# Patient Record
Sex: Female | Born: 1975
Health system: Southern US, Community
[De-identification: ages and names within clinical notes are randomized; demographics above are authoritative.]

## PROBLEM LIST (undated history)

## (undated) DIAGNOSIS — I8290 Acute embolism and thrombosis of unspecified vein: Secondary | ICD-10-CM

## (undated) DIAGNOSIS — R7611 Nonspecific reaction to tuberculin skin test without active tuberculosis: Secondary | ICD-10-CM

## (undated) DIAGNOSIS — Z86718 Personal history of other venous thrombosis and embolism: Secondary | ICD-10-CM

## (undated) DIAGNOSIS — M1990 Unspecified osteoarthritis, unspecified site: Secondary | ICD-10-CM

## (undated) DIAGNOSIS — E7800 Pure hypercholesterolemia, unspecified: Secondary | ICD-10-CM

## (undated) DIAGNOSIS — A159 Respiratory tuberculosis unspecified: Secondary | ICD-10-CM

## (undated) DIAGNOSIS — I87009 Postthrombotic syndrome without complications of unspecified extremity: Secondary | ICD-10-CM

## (undated) DIAGNOSIS — O24419 Gestational diabetes mellitus in pregnancy, unspecified control: Secondary | ICD-10-CM

## (undated) DIAGNOSIS — U071 COVID-19: Secondary | ICD-10-CM

## (undated) DIAGNOSIS — I82409 Acute embolism and thrombosis of unspecified deep veins of unspecified lower extremity: Secondary | ICD-10-CM

## (undated) DIAGNOSIS — B019 Varicella without complication: Secondary | ICD-10-CM

## (undated) DIAGNOSIS — D6851 Activated protein C resistance: Secondary | ICD-10-CM

## (undated) DIAGNOSIS — E785 Hyperlipidemia, unspecified: Secondary | ICD-10-CM

## (undated) DIAGNOSIS — E669 Obesity, unspecified: Secondary | ICD-10-CM

## (undated) DIAGNOSIS — E559 Vitamin D deficiency, unspecified: Secondary | ICD-10-CM

## (undated) DIAGNOSIS — I87002 Postthrombotic syndrome without complications of left lower extremity: Secondary | ICD-10-CM

## (undated) HISTORY — DX: Pure hypercholesterolemia, unspecified: E78.00

## (undated) HISTORY — DX: Acute embolism and thrombosis of unspecified deep veins of unspecified lower extremity: I82.409

## (undated) HISTORY — DX: Gestational diabetes mellitus in pregnancy, unspecified control: O24.419

## (undated) HISTORY — DX: Personal history of other venous thrombosis and embolism: Z86.718

## (undated) HISTORY — DX: Nonspecific reaction to tuberculin skin test without active tuberculosis: R76.11

## (undated) HISTORY — DX: Postthrombotic syndrome without complications of unspecified extremity: I87.009

## (undated) HISTORY — DX: Unspecified osteoarthritis, unspecified site: M19.90

## (undated) HISTORY — DX: Respiratory tuberculosis unspecified: A15.9

## (undated) HISTORY — DX: Acute embolism and thrombosis of unspecified vein: I82.90

## (undated) HISTORY — DX: Hyperlipidemia, unspecified: E78.5

## (undated) HISTORY — DX: Postthrombotic syndrome without complications of left lower extremity: I87.002

## (undated) HISTORY — DX: Vitamin D deficiency, unspecified: E55.9

## (undated) HISTORY — DX: Varicella without complication: B01.9

## (undated) HISTORY — DX: Obesity, unspecified: E66.9

## (undated) HISTORY — DX: Activated protein C resistance: D68.51

---

## 2011-12-03 LAB — HM PAP SMEAR: HM Pap smear: NORMAL

## 2012-04-02 DIAGNOSIS — Z86718 Personal history of other venous thrombosis and embolism: Secondary | ICD-10-CM

## 2012-04-02 DIAGNOSIS — I87009 Postthrombotic syndrome without complications of unspecified extremity: Secondary | ICD-10-CM

## 2012-04-02 HISTORY — DX: Personal history of other venous thrombosis and embolism: Z86.718

## 2012-04-02 HISTORY — DX: Postthrombotic syndrome without complications of unspecified extremity: I87.009

## 2012-07-11 ENCOUNTER — Telehealth: Payer: Self-pay | Admitting: Internal Medicine

## 2012-07-11 NOTE — Telephone Encounter (Signed)
This pt is a young lady who has a new patient appt with Dr. Fabian Sharp on 09/24/12. She has researched a vast number of PCP's, and after a long discussion with her regarding Dr. Rosezella [place] qualifications, she has chosen her to be her PCP. Pt states that she obtained a DVT during her pregnancy, and has had seriously complications since then. She's developed multiple problems that led her to a vascular surgeon, as well as a hematologist. She gives herself injections on a daily basis. She states that she is going to need referrals to both of these specialist in the [place] area, as she has just moved here from [place]. Please review these details and notify me if I need to bring her in sooner. Thank you!

## 2012-07-14 NOTE — Telephone Encounter (Signed)
Dr Cyndie Chime  , Autumn Cruz or Autumn Cruz for hematology   [facility] for vascular  Surgery if neededbut not certain what kind of vascular needed  She should get summary information to them from her previous care team.   Be best to make her own appt  with sending  Appropriate information to them first.  ( Since we dont have any information to send them )

## 2012-09-24 ENCOUNTER — Encounter: Payer: Self-pay | Admitting: Internal Medicine

## 2012-09-24 ENCOUNTER — Ambulatory Visit (INDEPENDENT_AMBULATORY_CARE_PROVIDER_SITE_OTHER): Payer: 59 | Admitting: Internal Medicine

## 2012-09-24 VITALS — BP 102/66 | HR 88 | Temp 98.5°F | Ht 64.0 in | Wt 199.0 lb

## 2012-09-24 DIAGNOSIS — Z8249 Family history of ischemic heart disease and other diseases of the circulatory system: Secondary | ICD-10-CM

## 2012-09-24 DIAGNOSIS — D6851 Activated protein C resistance: Secondary | ICD-10-CM

## 2012-09-24 DIAGNOSIS — Z86718 Personal history of other venous thrombosis and embolism: Secondary | ICD-10-CM

## 2012-09-24 DIAGNOSIS — F4320 Adjustment disorder, unspecified: Secondary | ICD-10-CM

## 2012-09-24 DIAGNOSIS — Z7901 Long term (current) use of anticoagulants: Secondary | ICD-10-CM

## 2012-09-24 DIAGNOSIS — M7918 Myalgia, other site: Secondary | ICD-10-CM | POA: Insufficient documentation

## 2012-09-24 DIAGNOSIS — D6859 Other primary thrombophilia: Secondary | ICD-10-CM

## 2012-09-24 DIAGNOSIS — E785 Hyperlipidemia, unspecified: Secondary | ICD-10-CM

## 2012-09-24 DIAGNOSIS — I87009 Postthrombotic syndrome without complications of unspecified extremity: Secondary | ICD-10-CM

## 2012-09-24 DIAGNOSIS — IMO0001 Reserved for inherently not codable concepts without codable children: Secondary | ICD-10-CM

## 2012-09-24 DIAGNOSIS — R7982 Elevated C-reactive protein (CRP): Secondary | ICD-10-CM

## 2012-09-24 NOTE — Patient Instructions (Signed)
Get [place] copy of most recent lipid panel.    Will arrange referrals to hematology and vascular as we discussed . The left buttock pain seems to be Musculoskeletal   And at this time would optimize  Walking with good shoes and consider  PT othre evaluation after above consults to decide next step.   Fu after consults   Or other concerns .

## 2012-09-24 NOTE — Progress Notes (Signed)
Chief Complaint  Patient presents with  . Establish Care    Needs referrals to hematology and cardiovascular surgeon.    Patient comes in as new patient visit . Previous care was  In [place] from where family moved in November for husbands job at [employer] . She is here with her husband and 2 young children for the visit.  She has a history of a left lower extremity deep vein thrombosis that occurred in pregnancy treated with anticoagulants and a filter that was removed later. She was found to have been heterozygous for factor V Leiden and was also noted to have PA 1 homozygote f mutation  homozygote . She has had some persistent dependent swelling in leg discomfort at times in that left lower strandy but has been evaluated by vascular a while back and felt she had postphlebitic syndrome and was told to use compression stockings used 2030 on her regular basis and 3040 on travel days. She is continued on anticoagulation Lovenox 40 mg as a prophylactic dose. At some point she was on Coumadin the discussion of using protects the or other options. At this time the consideration is which anticoagulants and if long life anticoagulation is appropriate.  She has a persistent elevated CRP at one point had her d-dimer monitored to decide about anticoagulation.  She has had evaluation for sleep apnea which was negative had gestational diabetes with only one pregnancy and the question of memory difficulties at some point that had a negative workup. Felt it might be related to stress medical problems sleep deprivation.  She was also evaluated for possible lumbar radiculopathy but had negative NCS lower extremities. 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 She just finished nursing her youngest child brings up the discussion again. Not really looking forward to be on medication if not needed.  She was noted  to have elevated lipid panel and elevated LPa  and a family history of premature cardiac disease in her mother having an MI in her 30s and a CABG. She did have diabetes. Negative family history of clotting except for her father who had an clot with  inactivity. Has multiple siblings with one sibling with medical problems of polio when she was 4 mobility problems and renal failure otherwise.   Had evaluation of carotid bruit that showed normal carotid Dopplers hasn't had coronary artery calcium She and her husband are originally from [place] in [place] for the last 12-13 years. They have 2 young children. 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   Not on lipid meds  Recently stopped nursing. Generic   Lovenox.  Will refer    To  Dr Marlena Clipper.   ROS: See pertinent positives and negatives per HPI. Negative current chest pain shortness of breath major changes in vision unusual bleeding she does have a catch in her left buttocks going down to her leg making it hard to walk which he first gets up in the morning but then works it out and does better. Still gets aching in her left ankle after walking a good bit. Does wear her compression stockings more regularly recently.  She's tried to lose weight but hasn't been successful most recently.  Feels somewhat angry and sad at times having to move young children at home and her mother died within the last year or so.  She's  not desponded but asks if medicine  other intervention as appropriate.  Past Medical History  Diagnosis Date  . Gestational diabetes   . High cholesterol   . Positive TB test   . Thrombosis     Left leg preg tulip filter then removed.     Family History  Problem Relation Age of Onset  . Heart disease Mother     Blockage/Main artery  . Diabetes Mother   . Hypertension Father     History   Social History  . Marital Status: Married    Spouse Name: Saleeth  LuLu    Number of Children: 2  . Years of Education: N/A   Occupational History  . Homemaker    Social History Main Topics  . Smoking status: Never Smoker   . Smokeless tobacco: None  . Alcohol Use: No  . Drug Use: No  . Sexually Active: None   Other Topics Concern  . None   Social History Narrative   Usually gets 6-7 hours of sleep per night   4 people living in the home.   Originally from [place] has a bachelor's degree some training in nursing and alternative medicine.   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   Negative tobacco alcohol some caffeine   Gravida 2 para 2 last Pap 2013   Think she had T. 2012    Outpatient Encounter Prescriptions as of 09/24/2012  Medication Sig Dispense Refill  . enoxaparin (LOVENOX) 40 MG/0.4ML injection Inject 40 mg into the skin daily.      . Multiple Vitamin (MULTI-VITAMIN PO) Take by mouth.       No facility-administered encounter medications on file as of 09/24/2012.    EXAM:  BP 102/66  Pulse 88  Temp(Src) 98.5 F (36.9 C) (Oral)  Ht 5\' 4"  (1.626 m)  Wt 199 lb (90.266 kg)  BMI 34.14 kg/m2  SpO2 98%  LMP 09/14/2012  Body mass index is 34.14 kg/(m^2).  GENERAL: vitals reviewed and listed above, alert, oriented, appears well hydrated and in no acute distress HEENT: atraumatic, conjunctiva  clear, no obvious abnormalities on inspection of external nose and ears  NECK: no obvious masses on inspection palpation i dont hear  bruits  Today are noted LUNGS: clear to auscultation bilaterally, no wheezes, rales or rhonchi, good air movement CV: HRRR, no clubbing cyanosis  nl cap refill left lower extremity slightly more swollen than the right but no redness focal pain Homans sign. Pulses are intact. Abdomen soft without organomegaly guarding or rebound Skin normal color no active bruising or petechiae. MS: moves all  extremities without noticeable focal  Abnormality No focal back pain no obvious weakness negative SLR PSYCH: pleasant and cooperative, no obvious depression or anxiety Records reviewed 3 inches the paper.records from [facility] physicians and specialist   ASSESSMENT AND PLAN:  Discussed the following assessment and plan:  Factor V Leiden mutation - Plan: Ambulatory referral to Hematology  Hypercoagulable state - post preg facto v leid PAI-1 4G/4G homozygote - Plan: Ambulatory referral to Hematology, Ambulatory referral to Vascular Surgery  Hx of thrombosis of lower extremity in pregnancy - Some postphlebitic changes - Plan: Ambulatory referral to Hematology, Ambulatory referral to Vascular Surgery  Hyperlipidemia - Get copy of latest lipid panel, family history of premature heart disease but uncertain if she meets criteria for intervention with medication. Consider CAC if  Left buttock pain - h xof radicular pain neg x ray in past not alarming consider other rx after above   Family history of premature CAD - Mother in her late 57s had diabetes  Adjustment reaction - As best adjustment to move death of mother 2 young children at hem etc. she appears to be pretty balanced consider seeing counselor first discussion of this bef  Elevated C-reactive protein (CRP) - nl esr disc nonspecific  for disease managment    Post-phlebitic syndrome - left. - Plan: Ambulatory referral to Vascular Surgery Options considered consider counseling CBT adjustment. Consider medication if needed. Can assess further at her followup preventive visit. Some names of  OB/GYN given  We'll plan referral to hematology to help with decisions about length of anticoagulation if appropriate and if so which anticoagulants are possible. -Patient advised to return or notify health care team  if symptoms worsen or persist or new concerns arise.  Patient Instructions  Get [place] copy of most recent lipid panel.    Will arrange  referrals to hematology and vascular as we discussed . The left buttock pain seems to be Musculoskeletal   And at this time would optimize  Walking with good shoes and consider  PT othre evaluation after above consults to decide next step.   Fu after consults   Or other concerns .     Neta Mends. Panosh M.D.    Addendum  6 30  Lipid results tc 238 hdl 48 ldl 167 tg 116 ratio 5.0 bg 90

## 2012-09-25 ENCOUNTER — Telehealth: Payer: Self-pay | Admitting: Oncology

## 2012-09-25 NOTE — Telephone Encounter (Signed)
S/w pt in re NP appt 08/06 @ 9:30 w/Dr. Cyndie Chime Labs 07/30 @ 10:30 Referring Dr. Berniece Andreas Dx- Hypercoag; Factor 5; Thombosis of lower extremity Welcome packet mailed.

## 2012-09-29 DIAGNOSIS — Z7901 Long term (current) use of anticoagulants: Secondary | ICD-10-CM | POA: Insufficient documentation

## 2012-10-06 ENCOUNTER — Encounter: Payer: Self-pay | Admitting: Vascular Surgery

## 2012-10-06 ENCOUNTER — Other Ambulatory Visit: Payer: Self-pay

## 2012-10-06 DIAGNOSIS — I872 Venous insufficiency (chronic) (peripheral): Secondary | ICD-10-CM

## 2012-10-06 DIAGNOSIS — M79609 Pain in unspecified limb: Secondary | ICD-10-CM

## 2012-10-22 ENCOUNTER — Telehealth: Payer: Self-pay | Admitting: Oncology

## 2012-10-22 NOTE — Telephone Encounter (Signed)
C/D 10/22/12 for appt. 10/23/12 °

## 2012-10-22 NOTE — Telephone Encounter (Signed)
C/D 10/22/12 for appt.11/05/12

## 2012-10-29 ENCOUNTER — Other Ambulatory Visit: Payer: 59 | Admitting: Lab

## 2012-10-30 ENCOUNTER — Other Ambulatory Visit: Payer: Self-pay | Admitting: Oncology

## 2012-10-30 DIAGNOSIS — D6859 Other primary thrombophilia: Secondary | ICD-10-CM

## 2012-10-30 DIAGNOSIS — Z7901 Long term (current) use of anticoagulants: Secondary | ICD-10-CM

## 2012-10-30 DIAGNOSIS — Z86718 Personal history of other venous thrombosis and embolism: Secondary | ICD-10-CM

## 2012-10-30 DIAGNOSIS — I87009 Postthrombotic syndrome without complications of unspecified extremity: Secondary | ICD-10-CM

## 2012-10-30 DIAGNOSIS — D6851 Activated protein C resistance: Secondary | ICD-10-CM

## 2012-11-05 ENCOUNTER — Encounter: Payer: Self-pay | Admitting: Oncology

## 2012-11-05 ENCOUNTER — Ambulatory Visit (HOSPITAL_BASED_OUTPATIENT_CLINIC_OR_DEPARTMENT_OTHER): Payer: 59 | Admitting: Oncology

## 2012-11-05 ENCOUNTER — Ambulatory Visit (HOSPITAL_BASED_OUTPATIENT_CLINIC_OR_DEPARTMENT_OTHER): Payer: 59 | Admitting: Lab

## 2012-11-05 ENCOUNTER — Telehealth: Payer: Self-pay | Admitting: Oncology

## 2012-11-05 ENCOUNTER — Ambulatory Visit: Payer: 59

## 2012-11-05 VITALS — BP 148/73 | HR 67 | Temp 97.5°F | Resp 18 | Ht 64.0 in | Wt 194.9 lb

## 2012-11-05 DIAGNOSIS — R7982 Elevated C-reactive protein (CRP): Secondary | ICD-10-CM

## 2012-11-05 DIAGNOSIS — Z86718 Personal history of other venous thrombosis and embolism: Secondary | ICD-10-CM

## 2012-11-05 DIAGNOSIS — D6859 Other primary thrombophilia: Secondary | ICD-10-CM

## 2012-11-05 DIAGNOSIS — D6851 Activated protein C resistance: Secondary | ICD-10-CM

## 2012-11-05 LAB — CBC & DIFF AND RETIC
BASO%: 0.2 % (ref 0.0–2.0)
Eosinophils Absolute: 0.1 10*3/uL (ref 0.0–0.5)
HCT: 40.2 % (ref 34.8–46.6)
LYMPH%: 29.3 % (ref 14.0–49.7)
MCHC: 32.8 g/dL (ref 31.5–36.0)
MCV: 93.1 fL (ref 79.5–101.0)
MONO%: 5.4 % (ref 0.0–14.0)
NEUT%: 63.9 % (ref 38.4–76.8)
Platelets: 221 10*3/uL (ref 145–400)
RBC: 4.32 10*6/uL (ref 3.70–5.45)
Retic %: 1.83 % (ref 0.70–2.10)

## 2012-11-05 LAB — COMPREHENSIVE METABOLIC PANEL (CC13)
AST: 14 U/L (ref 5–34)
Albumin: 3.6 g/dL (ref 3.5–5.0)
BUN: 10.7 mg/dL (ref 7.0–26.0)
Calcium: 9.4 mg/dL (ref 8.4–10.4)
Chloride: 105 mEq/L (ref 98–109)
Glucose: 89 mg/dl (ref 70–140)
Potassium: 3.6 mEq/L (ref 3.5–5.1)

## 2012-11-05 LAB — LACTATE DEHYDROGENASE (CC13): LDH: 143 U/L (ref 125–245)

## 2012-11-05 LAB — MORPHOLOGY

## 2012-11-05 LAB — C-REACTIVE PROTEIN: CRP: 0.9 mg/dL — ABNORMAL HIGH (ref <0.60)

## 2012-11-05 NOTE — Telephone Encounter (Signed)
Pt sent back to lb and given appt schedule for February 2015.

## 2012-11-05 NOTE — Progress Notes (Signed)
Checked in new patient with no financial issues. She wants mail and phone but had email also and I gave mchart and she may enroll.

## 2012-11-05 NOTE — Progress Notes (Signed)
New Patient Hematology-Oncology Evaluation   Autumn Cruz [id number] 1975-09-07 37 y.o. 11/05/2012  CC: Dr. Berniece Andreas   Reason for referral:  establish with hematologist in a young woman with history of. DVT during her first pregnancy   HPI:  Pleasant 37 year old woman originally from [place] recently living in [place]. She sustained a proximal left pelvic DVT at week 24 of her first pregnancy in 2010. She presented with left thigh pain and cyanotic changes of her left leg. She was fully anticoagulated. Apparently she had a retrievable vena cava filter placed and subsequently removed. Further evaluation revealed she is a heterozygote for the factor V Leiden gene mutation. She was also told that she is a homozygote for the PAI-1 gene. She was also told that she had an elevated C. reactive protein. She believes she has a record of this but she left the records at home.  She became pregnant again in 2012. She was fully anticoagulated and had no complications. She was subsequently changed to a prophylactic dose of Lovenox 40 mg daily and advised to take this on a chronic basis.  She had an initial evaluation with Prof. Bonnita Hollow, [facility], who is a nationally recognized Associate Professor. I do have his consultation report dated 12/18/2010. Dr. Dub Amis is of the same opinion as I am that although the PAI-1 mutation can theoretically predispose towards clotting, we still don't have any good clinical data to support this as a defined risk factor. He recommended stopping the Lovenox and only using anticoagulation when traveling or at times of immobilization or subsequent pregnancy. I would certainly concur with this opinion.  She does have mild, chronic, postphlebitic syndrome with intermittent swelling of her left lower extremity.  Her father who is a physician still practicing in [place] now 38 years old was in a motor vehicle accident in 1988 and had a crush injury  to his leg and developed a DVT. He has not been tested for the 5 Leiden gene mutation. He has stayed on chronic anticoagulation. Her mother died at age 56 but had no history of blood clots. She has 9 siblings. 4 of her 8 sisters were tested for 5 Leiden and one sister currently age 61 was positive. 4 other sisters and a brother where never tested. Her sister who is positive has not had any thrombotic events nor has anybody else in her family except for a maternal niece who had a DVT at age 4 currently living in [place] and was never tested for genetic defects.  She required C-sections with both of her children her daughter is now 60 years old and her son is 38 years old.     PMH: Past Medical History  Diagnosis Date  . Gestational diabetes   . High cholesterol   . Positive TB test   . Thrombosis     Left leg preg tulip filter then removed.   Marland Kitchen Hx of thrombosis 2014    Left lower extrm.  Marland Kitchen Post-phlebitic syndrome 2014    Left  LE  . Tuberculosis     Positive skin test negative chest x-ray   She denies any history of hepatitis, yellow jaundice, malaria, mononucleosis, thyroid trouble, seizures, diabetes, hypertension, ulcers.  Past Surgical History  Procedure Laterality Date  . Cesarean section  2010 & 2012    Allergies: No Known Allergies  Medications: Lovenox 40 mg subcutaneous daily. Multivitamins 1 daily  Social History: She is married. Her husband is a Engineer, water. 2 healthy  children. She has a undergraduate degree in biology. 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  reports that she has never smoked. She has never used smokeless tobacco. She reports that she does not drink alcohol or use illicit drugs.  Family History: Family History  Problem Relation Age of Onset  . Heart disease Mother     Blockage/Main artery  . Diabetes Mother   . Coronary artery disease Mother   . Hypertension Father     Review of Systems: Constitutional symptoms: No  constitutional symptoms HEENT: No sore throat Respiratory: No dyspnea or cough Cardiovascular:  No chest pain or palpitations Gastrointestinal ROS: Normal bowel habit Genito-Urinary ROS: Regular menstrual cycles Hematological and Lymphatic: Musculoskeletal: Postphlebitic syndrome mild Neurologic: No headache or change in vision Dermatologic: No rash Remaining ROS negative.  Physical Exam: Blood pressure 148/73, pulse 67, temperature 97.5 F (36.4 C), temperature source Oral, resp. rate 18, height 5\' 4"  (1.626 m), weight 194 lb 14.4 oz (88.406 kg). Wt Readings from Last 3 Encounters:  11/05/12 194 lb 14.4 oz (88.406 kg)  09/24/12 199 lb (90.266 kg)    General appearance: Well-nourished woman HENNT: Pharynx no erythema exudate or mass Lymph nodes: No cervical, supraclavicular, or axillary adenopathy Breasts: Lungs: Clear to auscultation resonant to percussion Heart: Regular rhythm no murmur gallop or click Vascular: Carotids 2+, no bruits, no cyanosis Abdominal: Soft, obese, nontender, no mass, no organomegaly GU: Extremities: No edema, no calf tenderness, some minor superficial venous distention left leg. Neurologic: Mental status intact, PERRLA, optic disc sharp, vessels normal, no hemorrhage exudate, motor strength 5 over 5, reflexes absent symmetric at the knees, 1+ symmetric at the biceps. Skin: No rash or ecchymosis    Lab Results: Lab Results  Component Value Date   WBC 9.8 11/05/2012   HGB 13.2 11/05/2012   HCT 40.2 11/05/2012   MCV 93.1 11/05/2012   PLT 221 11/05/2012     Chemistry   No results found for this basename: NA, K, CL, CO2, BUN, CREATININE, GLU   No results found for this basename: CALCIUM, ALKPHOS, AST, ALT, BILITOT       Impression and Plan: #1. Coagulopathy secondary to factor V Leiden heterozygote status and presumably some contribution from PAI-1 mutation.  #2. Proximal pelvic thrombosis during first pregnancy secondary to #1.  I had a lengthy  discussion with the patient with respect to type and duration of anticoagulation. She was told by some physicians that because there was residual clot in her veins that she needed to be on lifelong anticoagulation in addition to a parenteral anticoagulant. There is absolutely no evidence based medicine to support this recommendation. 2 large prospective studies and one large observational study published in 2011 specifically looking at residual clot as a predictor of increased risk for subsequent thrombosis all showed that there is no such correlation. With respect to  chronic anticoagulation in somebody who is a 35 Leiden heterozygote, unless there were other complicating issues or recurrent thrombotic episodes, there is also no evidence based recommendation for chronic anticoagulation. With respect to the type of anticoagulant, there is absolutely no reason that she couldn't use Coumadin as opposed to a parenteral anticoagulant. As long as she is not pregnant, there would also be no contraindication to using one of the new oral anticoagulants. I favor Xarelto since we have the most experience with this drug and it is once daily dosing.  I told her that whenever there are gray areas in medicine there are differences in opinions  among experts. However, in her case, I see no indication for chronic anticoagulation therapy. I recommended that she stop the Lovenox. Go on aspirin 81 mg daily which gives some protection against rethrombosis (approximate 30% reduction) and I agree with Dr. Dub Amis that we don't know whether the PAI-1 mutation potentiates her thrombotic risk but to date we have not had any descriptions of people with this gene mutation having increased thrombotic risk although it is theoretical.  With respect to her postphlebitic syndrome, this appears to be clinically mild and could be managed conservatively with elastic stocking if it flares up.  She will likely get pregnant again in the future.  Discussion at that time will be prophylactic versus therapeutic anticoagulation. If this happens, she will be out 5 or more years from her initial event and I would favor prophylactic not therapeutic doses of low molecular weight heparin.      Levert Feinstein, MD 11/05/2012, 11:24 AM

## 2012-11-14 ENCOUNTER — Telehealth: Payer: Self-pay | Admitting: *Deleted

## 2012-11-14 NOTE — Telephone Encounter (Signed)
Late entry:  Notified pt 11/11/12 of lab results per Dr Cyndie Chime.  E-mail address given to Dr Cyndie Chime to send articles.

## 2012-11-14 NOTE — Telephone Encounter (Signed)
Message copied by Sabino Snipes on Fri Nov 14, 2012  6:22 PM ------      Message from: Levert Feinstein      Created: Thu Nov 06, 2012  2:18 PM       Call pt: both CRP and d-dimer test are mildly elevated. No change in what we discussed yesterday.  I left reference articles at home - I will forward to her next week.  Does she have an email? ------

## 2012-11-19 ENCOUNTER — Encounter: Payer: Self-pay | Admitting: Vascular Surgery

## 2012-11-20 ENCOUNTER — Encounter: Payer: Self-pay | Admitting: Vascular Surgery

## 2012-11-20 ENCOUNTER — Ambulatory Visit (INDEPENDENT_AMBULATORY_CARE_PROVIDER_SITE_OTHER): Payer: 59 | Admitting: Vascular Surgery

## 2012-11-20 ENCOUNTER — Encounter (INDEPENDENT_AMBULATORY_CARE_PROVIDER_SITE_OTHER): Payer: 59 | Admitting: *Deleted

## 2012-11-20 VITALS — BP 106/67 | HR 78 | Ht 64.0 in | Wt 194.6 lb

## 2012-11-20 DIAGNOSIS — M7989 Other specified soft tissue disorders: Secondary | ICD-10-CM

## 2012-11-20 DIAGNOSIS — I872 Venous insufficiency (chronic) (peripheral): Secondary | ICD-10-CM

## 2012-11-20 DIAGNOSIS — M79609 Pain in unspecified limb: Secondary | ICD-10-CM

## 2012-11-20 DIAGNOSIS — Z8672 Personal history of thrombophlebitis: Secondary | ICD-10-CM

## 2012-11-20 NOTE — Progress Notes (Signed)
[facility] HISTORY AND PHYSICAL   History of Present Illness:  Patient is a 37 y.o. year old female who presents for evaluation of leg swelling post DVT.  The patient experienced a significant DVT in 2010. This was during a pregnancy. However she was also noted to have factor V Leiden deficiency. This was all in [place]. Several pages of medical records were reviewed today. She is currently on Lovenox daily. She does develop progressive swelling of her lower extremities if she is on her feet all day. She does wear compression stockings. She is compliant with this. She previously had an inferior vena cava filter placed which has been removed. Other medical problems include elevated cholesterol, gestational diabetes. These are currently controlled.  Past Medical History  Diagnosis Date  . Gestational diabetes   . High cholesterol   . Positive TB test   . Thrombosis     Left leg preg tulip filter then removed.   Marland Kitchen Hx of thrombosis 2014    Left lower extrm.  Marland Kitchen Post-phlebitic syndrome 2014    Left  LE  . Tuberculosis     Positive test  . DVT (deep venous thrombosis)     Past Surgical History  Procedure Laterality Date  . Cesarean section  2010 & 2012     Social History History  Substance Use Topics  . Smoking status: Never Smoker   . Smokeless tobacco: Never Used  . Alcohol Use: No    Family History Family History  Problem Relation Age of Onset  . Heart disease Mother     Blockage/Main artery  . Diabetes Mother   . Coronary artery disease Mother   . Hyperlipidemia Mother   . Hypertension Father   . Deep vein thrombosis Father     Allergies  No Known Allergies   Current Outpatient Prescriptions  Medication Sig Dispense Refill  . enoxaparin (LOVENOX) 40 MG/0.4ML injection Inject 40 mg into the skin daily.      . Multiple Vitamin (MULTI-VITAMIN PO) Take by mouth.       No current facility-administered medications for this visit.     ROS:   General:  No weight loss, Fever, chills  HEENT: No recent headaches, no nasal bleeding, no visual changes, no sore throat  Neurologic: No dizziness, blackouts, seizures. No recent symptoms of stroke or mini- stroke. No recent episodes of slurred speech, or temporary blindness.  Cardiac: No recent episodes of chest pain/pressure, no shortness of breath at rest.  No shortness of breath with exertion.  Denies history of atrial fibrillation or irregular heartbeat  Vascular: No history of rest pain in feet.  No history of claudication.  No history of non-healing ulcer, No history of DVT   Pulmonary: No home oxygen, no productive cough, no hemoptysis,  No asthma or wheezing  Musculoskeletal:  [ ]  Arthritis, [ ]  Low back pain,  [ ]  Joint pain  Hematologic:No history of hypercoagulable state.  No history of easy bleeding.  No history of anemia  Gastrointestinal: No hematochezia or melena,  No gastroesophageal reflux, no trouble swallowing  Urinary: [ ]  chronic Kidney disease, [ ]  on HD - [ ]  MWF or [ ]  TTHS, [ ]  Burning with urination, [ ]  Frequent urination, [ ]  Difficulty urinating;   Skin: No rashes  Psychological: No history of anxiety,  No history of depression   Physical Examination  Filed Vitals:   11/20/12 1124  BP: 106/67  Pulse: 78  Height: 5\' 4"  (  1.626 m)  Weight: 194 lb 9.6 oz (88.27 kg)  SpO2: 100%    Body mass index is 33.39 kg/(m^2).  General:  Alert and oriented, no acute distress HEENT: Normal Neck: No bruit or JVD Pulmonary: Clear to auscultation bilaterally Cardiac: Regular Rate and Rhythm without murmur Abdomen: Soft, non-tender, non-distended, no mass Skin: No rash, few small spider-type varicosities adjacent to the ankles bilaterally no obvious large varicosities Extremity Pulses:  2+ radial, brachial, femoral, dorsalis pedis, posterior tibial pulses bilaterally Musculoskeletal: No deformity trace edema lower extremities  bilaterally  Neurologic: Upper and lower extremity motor 5/5 and symmetric  DATA: Patient had a venous duplex exam today. There was evidence of chronic DVT in the left common femoral vein which was chronic. She also had evidence of deep venous reflux on the left side.   ASSESSMENT: Chronic DVT with symptoms of postphlebitic syndrome primarily swelling no ulceration.  History of factor V Leiden deficiency.   PLAN:  I discussed with the patient that the mainstay of therapy for her is one to be continued compression garments. We also did discuss venous valve reconstruction. However in the presence of chronic DVT and the fact that most of these valve reconstructions have marginal improvement I did not recommend this. I do believe she would benefit from lifelong anticoagulation in light of her factor V Leiden deficiency and prior DVT history. She will followup with me on as-needed basis. We also discussed weight loss as a preventative measure to decrease her overall DVT risk.  Fabienne Bruns, MD [facility] Office: [phone] Pager: [phone]

## 2012-12-10 ENCOUNTER — Telehealth: Payer: Self-pay | Admitting: Oncology

## 2012-12-10 NOTE — Telephone Encounter (Signed)
PT ORIGINAL MEDICAL RECORDS MAILED. PER PT REQUEST.

## 2013-02-04 ENCOUNTER — Encounter: Payer: Self-pay | Admitting: Internal Medicine

## 2013-02-04 ENCOUNTER — Ambulatory Visit (INDEPENDENT_AMBULATORY_CARE_PROVIDER_SITE_OTHER): Payer: 59 | Admitting: Internal Medicine

## 2013-02-04 VITALS — BP 96/62 | HR 86 | Temp 98.1°F | Ht 64.25 in | Wt 196.0 lb

## 2013-02-04 DIAGNOSIS — Z23 Encounter for immunization: Secondary | ICD-10-CM

## 2013-02-04 DIAGNOSIS — Z86718 Personal history of other venous thrombosis and embolism: Secondary | ICD-10-CM

## 2013-02-04 DIAGNOSIS — Z8249 Family history of ischemic heart disease and other diseases of the circulatory system: Secondary | ICD-10-CM

## 2013-02-04 DIAGNOSIS — E785 Hyperlipidemia, unspecified: Secondary | ICD-10-CM

## 2013-02-04 DIAGNOSIS — Z8632 Personal history of gestational diabetes: Secondary | ICD-10-CM

## 2013-02-04 DIAGNOSIS — Z0000 Encounter for general adult medical examination without abnormal findings: Secondary | ICD-10-CM | POA: Insufficient documentation

## 2013-02-04 DIAGNOSIS — D6859 Other primary thrombophilia: Secondary | ICD-10-CM

## 2013-02-04 DIAGNOSIS — D6851 Activated protein C resistance: Secondary | ICD-10-CM

## 2013-02-04 DIAGNOSIS — F4321 Adjustment disorder with depressed mood: Secondary | ICD-10-CM

## 2013-02-04 LAB — LIPID PANEL
Cholesterol: 257 mg/dL — ABNORMAL HIGH (ref 0–200)
Total CHOL/HDL Ratio: 5
VLDL: 17.4 mg/dL (ref 0.0–40.0)

## 2013-02-04 LAB — CBC WITH DIFFERENTIAL/PLATELET
Basophils Relative: 0.3 % (ref 0.0–3.0)
Eosinophils Relative: 1.1 % (ref 0.0–5.0)
Hemoglobin: 14.2 g/dL (ref 12.0–15.0)
Lymphocytes Relative: 30.9 % (ref 12.0–46.0)
MCV: 92.6 fl (ref 78.0–100.0)
Neutrophils Relative %: 62.4 % (ref 43.0–77.0)
Platelets: 228 10*3/uL (ref 150.0–400.0)
RBC: 4.55 Mil/uL (ref 3.87–5.11)
WBC: 11.9 10*3/uL — ABNORMAL HIGH (ref 4.5–10.5)

## 2013-02-04 LAB — HEPATIC FUNCTION PANEL
ALT: 35 U/L (ref 0–35)
AST: 25 U/L (ref 0–37)
Alkaline Phosphatase: 60 U/L (ref 39–117)
Total Bilirubin: 1 mg/dL (ref 0.3–1.2)

## 2013-02-04 LAB — BASIC METABOLIC PANEL
BUN: 12 mg/dL (ref 6–23)
Calcium: 9.6 mg/dL (ref 8.4–10.5)
Creatinine, Ser: 0.8 mg/dL (ref 0.4–1.2)
GFR: 90.27 mL/min (ref >60.00)

## 2013-02-04 LAB — LDL CHOLESTEROL, DIRECT: Direct LDL: 200.7 mg/dL

## 2013-02-04 LAB — HEMOGLOBIN A1C: Hgb A1c MFr Bld: 5.8 % (ref 4.6–6.5)

## 2013-02-04 LAB — T4, FREE: Free T4: 0.72 ng/dL (ref 0.60–1.60)

## 2013-02-04 MED ORDER — FLUOXETINE HCL 10 MG PO CAPS: 10.0000 mg | ORAL_CAPSULE | Freq: Every day | ORAL | Status: DC

## 2013-02-04 NOTE — Progress Notes (Signed)
Chief Complaint  Patient presents with  . Annual Exam    to have gyne check  this week    HPI: Patient comes in today for Preventive Health Care visit   Sleep is better   Impatient  At times children are high maintenance  And not a lot of rest.   One at home and one at school.  Some depressive sx  Energy issue no osa but some snoring .  Husband works late  children age 38.5  And 4 +  prys and tries to help but  No relief makes it hard   To eat healthy and sleep and lose weight  Got different opinions from hem and vascular  .  About anticoagulation so for now on lovenox .   Asks about cancer tumor markers.  To be done  But no hx of cancer   Had gest dm ? If should test.  Has labs to review. Tc at some point was 268 and 6 months ago for insurance was in the mid 200s.   Has appt with  OB gyne tomorrow. ROS:  GEN/ HEENT: No fever, significant weight changes sweats headaches vision problems hearing changes, CV/ PULM; No chest pain shortness of breath cough, syncope,edema  change in exercise tolerance. GI /GU: No adominal pain, vomiting, change in bowel habits. No blood in the stool.  Painful periods since birth of last child. SKIN/HEME: ,no acute skin rashes suspicious lesions or bleeding. No lymphadenopathy, nodules, masses.  NEURO/ PSYCH:  No neurologic signs such as weakness numbness. No anxiety. IMM/ Allergy: No unusual infections.  Allergy .   REST of 12 system review negative except as per HPI   Past Medical History  Diagnosis Date  . Gestational diabetes   . High cholesterol   . Positive TB test   . Thrombosis     Left leg preg tulip filter then removed.   Marland Kitchen Hx of thrombosis 2014    Left lower extrm.  Marland Kitchen Post-phlebitic syndrome 2014    Left  LE  . Tuberculosis     Positive test  . DVT (deep venous thrombosis)     Family History  Problem Relation Age of Onset  . Heart disease Mother     Blockage/Main artery  . Diabetes Mother   . Coronary artery disease Mother   .  Hyperlipidemia Mother   . Hypertension Father   . Deep vein thrombosis Father     History   Social History  . Marital Status: Married    Spouse Name: [name]    Number of Children: 2  . Years of Education: N/A   Occupational History  . Homemaker    Social History Main Topics  . Smoking status: Never Smoker   . Smokeless tobacco: Never Used  . Alcohol Use: No  . Drug Use: No  . Sexual Activity: None   Other Topics Concern  . None   Social History Narrative   Usually gets 6-7 hours of sleep per night   4 people living in the home.   Originally from [place] has a bachelor's degree some training in nursing and alternative medicine.   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   Negative tobacco alcohol some caffeine   Gravida 2 para 2 last Pap 2013   Think she had T. 2012    Outpatient Encounter Prescriptions as of 02/04/2013  Medication Sig  . enoxaparin (LOVENOX) 40 MG/0.4ML injection Inject 40 mg into the skin daily.  . Multiple Vitamin (MULTI-VITAMIN PO) Take by mouth.  Marland Kitchen FLUoxetine (PROZAC) 10 MG capsule Take 1 capsule (10 mg total) by mouth daily.    EXAM:  BP 96/62  Pulse 86  Temp(Src) 98.1 F (36.7 C) (Oral)  Ht 5' 4.25" (1.632 m)  Wt 196 lb (88.905 kg)  BMI 33.38 kg/m2  SpO2 99%  LMP 01/26/2013  Body mass index is 33.38 kg/(m^2).  Physical Exam: Vital signs reviewed JXB:JYNW is a well-developed well-nourished alert cooperative   female who appears her stated age in no acute distress.  HEENT: normocephalic atraumatic , Eyes: PERRL EOM's full, conjunctiva clear, Nares: paten,t no deformity discharge or tenderness., Ears: no deformity EAC's clear TMs with normal landmarks. Mouth: clear OP, no lesions, edema.  Moist mucous membranes. Dentition in adequate repair. NECK: supple without masses, thyromegaly or bruits. CHEST/PULM:  Clear to  auscultation and percussion breath sounds equal no wheeze , rales or rhonchi. No chest wall deformities or tenderness. CV: PMI is nondisplaced, S1 S2 no gallops, murmurs, rubs. Peripheral pulses are full without delay.No JVD .  Breast: normal by inspection . No dimpling, discharge, masses, tenderness or discharge . ABDOMEN: Bowel sounds normal nontender  No guard or rebound, no hepato splenomegal no CVA tenderness.   Extremtities:  No clubbing cyanosis or edema, no acute joint swelling or redness no focal atrophy NEURO:  Oriented x3, cranial nerves 3-12 appear to be intact, no obvious focal weakness,gait within normal limits no abnormal reflexes  Hard to elicit or asymmetrical SKIN: No acute rashes normal turgor, color, no bruising or petechiae. PSYCH: Oriented, good eye contact, y, cognition and judgment appear normal. Nl eye contact  Slightly  down   3 yo child attending clinic LN: no cervical axillary i adenopathy   ASSESSMENT AND PLAN:  Discussed the following assessment and plan:  Visit for preventive health examination - Plan: Basic metabolic panel, CBC with Differential, Hemoglobin A1c, Hepatic function panel, Lipid panel, TSH, T4, free  Need for prophylactic vaccination and inoculation against influenza - Plan: Flu Vaccine QUAD 36+ mos PF IM (Fluarix), Basic metabolic panel, CBC with Differential, Hemoglobin A1c, Hepatic function panel, Lipid panel, TSH, T4, free  Family history of premature CAD - Plan: Basic metabolic panel, CBC with Differential, Hemoglobin A1c, Hepatic function panel, Lipid panel, TSH, T4, free  Hyperlipidemia - Plan: Basic metabolic panel, CBC with Differential, Hemoglobin A1c, Hepatic function panel, Lipid panel, TSH, T4, free  Hx gestational diabetes - Plan: Basic metabolic panel, CBC with Differential, Hemoglobin A1c, Hepatic function panel, Lipid panel, TSH, T4, free  Adjustment disorder with depressed mood - disc options support lsi not feeling counseling  for now disc poss cbt small children at home etc. try low dose med and fu   Factor V Leiden mutation  Hx of thrombosis of lower extremity in pregnancy Cancer markers not helpful unless has a cancer following.  Reviewed some of her old labs  No hga 1c in  recent ones  Disc conflicting  Opinions from hem and vascular  Disc  Local  Phenom ( leg) and over coagulability risk  consider oral meds or use of full anticoag when at risk situation such as travel immobility and preg. Etc.   Risk benefit of medication discussed. For  depressive sx     Patient Care Team: Madelin Headings, MD as PCP - General (Internal Medicine) Levert Feinstein, MD as Consulting  Physician (Oncology) Sherren Kerns, MD as Consulting Physician (Vascular Surgery) Genia Del, MD as Consulting Physician (Obstetrics and Gynecology) Patient Instructions  Can try low dose fluoxetine  10 per day  . Usual dose is 20 mg per day.  Other time and good sleep if also helpful. ROV in about  4 weeks for med evaluation   150 minutes of exercise weeks  ,  Lose weight  To healthy levels. Avoid trans fats and processed foods;  Increase fresh fruits and veges to 5 servings per day. And avoid sweet beverages  Including tea and juice.  Will notify you  of labs when available. Disc abd cramps and periods with [facility]BG[facility] Healthy weight loss can help you lipid profile.     Preventive Care for Adults, Female A healthy lifestyle and preventive care can promote health and wellness. Preventive health guidelines for women include the following key practices.  A routine yearly physical is a good way to check with your caregiver about your health and preventive screening. It is a chance to share any concerns and updates on your health, and to receive a thorough exam.  Visit your dentist for a routine exam and preventive care every 6 months. Brush your teeth twice a day and floss once a day. Good oral hygiene prevents tooth decay and gum  disease.  The frequency of eye exams is based on your age, health, family medical history, use of contact lenses, and other factors. Follow your caregiver's recommendations for frequency of eye exams.  Eat a healthy diet. Foods like vegetables, fruits, whole grains, low-fat dairy products, and lean protein foods contain the nutrients you need without too many calories. Decrease your intake of foods high in solid fats, added sugars, and salt. Eat the right amount of calories for you.Get information about a proper diet from your caregiver, if necessary.  Regular physical exercise is one of the most important things you can do for your health. Most adults should get at least 150 minutes of moderate-intensity exercise (any activity that increases your heart rate and causes you to sweat) each week. In addition, most adults need muscle-strengthening exercises on 2 or more days a week.  Maintain a healthy weight. The body mass index (BMI) is a screening tool to identify possible weight problems. It provides an estimate of body fat based on height and weight. Your caregiver can help determine your BMI, and can help you achieve or maintain a healthy weight.For adults 20 years and older:  A BMI below 18.5 is considered underweight.  A BMI of 18.5 to 24.9 is normal.  A BMI of 25 to 29.9 is considered overweight.  A BMI of 30 and above is considered obese.  Maintain normal blood lipids and cholesterol levels by exercising and minimizing your intake of saturated fat. Eat a balanced diet with plenty of fruit and vegetables. Blood tests for lipids and cholesterol should begin at age 70 and be repeated every 5 years. If your lipid or cholesterol levels are high, you are over 50, or you are at high risk for heart disease, you may need your cholesterol levels checked more frequently.Ongoing high lipid and cholesterol levels should be treated with medicines if diet and exercise are not effective.  If you smoke,  find out from your caregiver how to quit. If you do not use tobacco, do not start.  Lung cancer screening is recommended for adults aged 35 80 years who are at high risk for developing lung cancer because of  a history of smoking. Yearly low-dose computed tomography (CT) is recommended for people who have at least a 30-pack-year history of smoking and are a current smoker or have quit within the past 15 years. A pack year of smoking is smoking an average of 1 pack of cigarettes a day for 1 year (for example: 1 pack a day for 30 years or 2 packs a day for 15 years). Yearly screening should continue until the smoker has stopped smoking for at least 15 years. Yearly screening should also be stopped for people who develop a health problem that would prevent them from having lung cancer treatment.  If you are pregnant, do not drink alcohol. If you are breastfeeding, be very cautious about drinking alcohol. If you are not pregnant and choose to drink alcohol, do not exceed 1 drink per day. One drink is considered to be 12 ounces (355 mL) of beer, 5 ounces (148 mL) of wine, or 1.5 ounces (44 mL) of liquor.  Avoid use of street drugs. Do not share needles with anyone. Ask for help if you need support or instructions about stopping the use of drugs.  High blood pressure causes heart disease and increases the risk of stroke. Your blood pressure should be checked at least every 1 to 2 years. Ongoing high blood pressure should be treated with medicines if weight loss and exercise are not effective.  If you are 33 to 38 years old, ask your caregiver if you should take aspirin to prevent strokes.  Diabetes screening involves taking a blood sample to check your fasting blood sugar level. This should be done once every 3 years, after age 30, if you are within normal weight and without risk factors for diabetes. Testing should be considered at a younger age or be carried out more frequently if you are overweight and have  at least 1 risk factor for diabetes.  Breast cancer screening is essential preventive care for women. You should practice "breast self-awareness." This means understanding the normal appearance and feel of your breasts and may include breast self-examination. Any changes detected, no matter how small, should be reported to a caregiver. Women in their 2s and 30s should have a clinical breast exam (CBE) by a caregiver as part of a regular health exam every 1 to 3 years. After age 84, women should have a CBE every year. Starting at age 47, women should consider having a mammography (breast X-ray test) every year. Women who have a family history of breast cancer should talk to their caregiver about genetic screening. Women at a high risk of breast cancer should talk to their caregivers about having magnetic resonance imaging (MRI) and a mammography every year.  Breast cancer gene (BRCA)-related cancer risk assessment is recommended for women who have family members with BRCA-related cancers. BRCA-related cancers include breast, ovarian, tubal, and peritoneal cancers. Having family members with these cancers may be associated with an increased risk for harmful changes (mutations) in the breast cancer genes BRCA1 and BRCA2. Results of the assessment will determine the need for genetic counseling and BRCA1 and BRCA2 testing.  The Pap test is a screening test for cervical cancer. A Pap test can show cell changes on the cervix that might become cervical cancer if left untreated. A Pap test is a procedure in which cells are obtained and examined from the lower end of the uterus (cervix).  Women should have a Pap test starting at age 59.  Between ages 66 and 18, Pap  tests should be repeated every 2 years.  Beginning at age 80, you should have a Pap test every 3 years as long as the past 3 Pap tests have been normal.  Some women have medical problems that increase the chance of getting cervical cancer. Talk to  your caregiver about these problems. It is especially important to talk to your caregiver if a new problem develops soon after your last Pap test. In these cases, your caregiver may recommend more frequent screening and Pap tests.  The above recommendations are the same for women who have or have not gotten the vaccine for human papillomavirus (HPV).  If you had a hysterectomy for a problem that was not cancer or a condition that could lead to cancer, then you no longer need Pap tests. Even if you no longer need a Pap test, a regular exam is a good idea to make sure no other problems are starting.  If you are between ages 60 and 55, and you have had normal Pap tests going back 10 years, you no longer need Pap tests. Even if you no longer need a Pap test, a regular exam is a good idea to make sure no other problems are starting.  If you have had past treatment for cervical cancer or a condition that could lead to cancer, you need Pap tests and screening for cancer for at least 20 years after your treatment.  If Pap tests have been discontinued, risk factors (such as a new sexual partner) need to be reassessed to determine if screening should be resumed.  The HPV test is an additional test that may be used for cervical cancer screening. The HPV test looks for the virus that can cause the cell changes on the cervix. The cells collected during the Pap test can be tested for HPV. The HPV test could be used to screen women aged 52 years and older, and should be used in women of any age who have unclear Pap test results. After the age of 39, women should have HPV testing at the same frequency as a Pap test.  Colorectal cancer can be detected and often prevented. Most routine colorectal cancer screening begins at the age of 77 and continues through age 33. However, your caregiver may recommend screening at an earlier age if you have risk factors for colon cancer. On a yearly basis, your caregiver may provide  home test kits to check for hidden blood in the stool. Use of a small camera at the end of a tube, to directly examine the colon (sigmoidoscopy or colonoscopy), can detect the earliest forms of colorectal cancer. Talk to your caregiver about this at age 15, when routine screening begins. Direct examination of the colon should be repeated every 5 to 10 years through age 62, unless early forms of pre-cancerous polyps or small growths are found.  Hepatitis C blood testing is recommended for all people born from [birth year] through 1966 and any individual with known risks for hepatitis C.  Practice safe sex. Use condoms and avoid high-risk sexual practices to reduce the spread of sexually transmitted infections (STIs). STIs include gonorrhea, chlamydia, syphilis, trichomonas, herpes, HPV, and human immunodeficiency virus (HIV). Herpes, HIV, and HPV are viral illnesses that have no cure. They can result in disability, cancer, and death. Sexually active women aged 53 and younger should be checked for chlamydia. Older women with new or multiple partners should also be tested for chlamydia. Testing for other STIs is recommended if you  are sexually active and at increased risk.  Osteoporosis is a disease in which the bones lose minerals and strength with aging. This can result in serious bone fractures. The risk of osteoporosis can be identified using a bone density scan. Women ages 38 and over and women at risk for fractures or osteoporosis should discuss screening with their caregivers. Ask your caregiver whether you should take a calcium supplement or vitamin D to reduce the rate of osteoporosis.  Menopause can be associated with physical symptoms and risks. Hormone replacement therapy is available to decrease symptoms and risks. You should talk to your caregiver about whether hormone replacement therapy is right for you.  Use sunscreen. Apply sunscreen liberally and repeatedly throughout the day. You should seek  shade when your shadow is shorter than you. Protect yourself by wearing long sleeves, pants, a wide-brimmed hat, and sunglasses year round, whenever you are outdoors.  Once a month, do a whole body skin exam, using a mirror to look at the skin on your back. Notify your caregiver of new moles, moles that have irregular borders, moles that are larger than a pencil eraser, or moles that have changed in shape or color.  Stay current with required immunizations.  Influenza vaccine. All adults should be immunized every year.  Tetanus, diphtheria, and acellular pertussis (Td, Tdap) vaccine. Pregnant women should receive 1 dose of Tdap vaccine during each pregnancy. The dose should be obtained regardless of the length of time since the last dose. Immunization is preferred during the 27th to 36th week of gestation. An adult who has not previously received Tdap or who does not know her vaccine status should receive 1 dose of Tdap. This initial dose should be followed by tetanus and diphtheria toxoids (Td) booster doses every 10 years. Adults with an unknown or incomplete history of completing a 3-dose immunization series with Td-containing vaccines should begin or complete a primary immunization series including a Tdap dose. Adults should receive a Td booster every 10 years.  Varicella vaccine. An adult without evidence of immunity to varicella should receive 2 doses or a second dose if she has previously received 1 dose. Pregnant females who do not have evidence of immunity should receive the first dose after pregnancy. This first dose should be obtained before leaving the health care facility. The second dose should be obtained 4 8 weeks after the first dose.  Human papillomavirus (HPV) vaccine. Females aged 84 26 years who have not received the vaccine previously should obtain the 3-dose series. The vaccine is not recommended for use in pregnant females. However, pregnancy testing is not needed before receiving  a dose. If a female is found to be pregnant after receiving a dose, no treatment is needed. In that case, the remaining doses should be delayed until after the pregnancy. Immunization is recommended for any person with an immunocompromised condition through the age of 26 years if she did not get any or all doses earlier. During the 3-dose series, the second dose should be obtained 4 8 weeks after the first dose. The third dose should be obtained 24 weeks after the first dose and 16 weeks after the second dose.  Zoster vaccine. One dose is recommended for adults aged 59 years or older unless certain conditions are present.  Measles, mumps, and rubella (MMR) vaccine. Adults born before [birth year] generally are considered immune to measles and mumps. Adults born in [birth year] or later should have 1 or more doses of MMR vaccine unless there is  a contraindication to the vaccine or there is laboratory evidence of immunity to each of the three diseases. A routine second dose of MMR vaccine should be obtained at least 28 days after the first dose for students attending postsecondary schools, health care workers, or international travelers. People who received inactivated measles vaccine or an unknown type of measles vaccine during 1963 1967 should receive 2 doses of MMR vaccine. People who received inactivated mumps vaccine or an unknown type of mumps vaccine before 1979 and are at high risk for mumps infection should consider immunization with 2 doses of MMR vaccine. For females of childbearing age, rubella immunity should be determined. If there is no evidence of immunity, females who are not pregnant should be vaccinated. If there is no evidence of immunity, females who are pregnant should delay immunization until after pregnancy. Unvaccinated health care workers born before [birth year] who lack laboratory evidence of measles, mumps, or rubella immunity or laboratory confirmation of disease should consider measles and mumps  immunization with 2 doses of MMR vaccine or rubella immunization with 1 dose of MMR vaccine.  Pneumococcal 13-valent conjugate (PCV13) vaccine. When indicated, a person who is uncertain of her immunization history and has no record of immunization should receive the PCV13 vaccine. An adult aged 66 years or older who has certain medical conditions and has not been previously immunized should receive 1 dose of PCV13 vaccine. This PCV13 should be followed with a dose of pneumococcal polysaccharide (PPSV23) vaccine. The PPSV23 vaccine dose should be obtained at least 8 weeks after the dose of PCV13 vaccine. An adult aged 70 years or older who has certain medical conditions and previously received 1 or more doses of PPSV23 vaccine should receive 1 dose of PCV13. The PCV13 vaccine dose should be obtained 1 or more years after the last PPSV23 vaccine dose.  Pneumococcal polysaccharide (PPSV23) vaccine. When PCV13 is also indicated, PCV13 should be obtained first. All adults aged 15 years and older should be immunized. An adult younger than age 20 years who has certain medical conditions should be immunized. Any person who resides in a nursing home or long-term care facility should be immunized. An adult smoker should be immunized. People with an immunocompromised condition and certain other conditions should receive both PCV13 and PPSV23 vaccines. People with human immunodeficiency virus (HIV) infection should be immunized as soon as possible after diagnosis. Immunization during chemotherapy or radiation therapy should be avoided. Routine use of PPSV23 vaccine is not recommended for American Indians, [address], or people younger than 65 years unless there are medical conditions that require PPSV23 vaccine. When indicated, people who have unknown immunization and have no record of immunization should receive PPSV23 vaccine. One-time revaccination 5 years after the first dose of PPSV23 is recommended for people aged  70 64 years who have chronic kidney failure, nephrotic syndrome, asplenia, or immunocompromised conditions. People who received 1 2 doses of PPSV23 before age 68 years should receive another dose of PPSV23 vaccine at age 23 years or later if at least 5 years have passed since the previous dose. Doses of PPSV23 are not needed for people immunized with PPSV23 at or after age 36 years.  Meningococcal vaccine. Adults with asplenia or persistent complement component deficiencies should receive 2 doses of quadrivalent meningococcal conjugate (MenACWY-D) vaccine. The doses should be obtained at least 2 months apart. Microbiologists working with certain meningococcal bacteria, military recruits, people at risk during an outbreak, and people who travel to or live in countries  with a high rate of meningitis should be immunized. A first-year college student up through age 80 years who is living in a residence hall should receive a dose if she did not receive a dose on or after her 16th birthday. Adults who have certain high-risk conditions should receive one or more doses of vaccine.  Hepatitis A vaccine. Adults who wish to be protected from this disease, have certain high-risk conditions, work with hepatitis A-infected animals, work in hepatitis A research labs, or travel to or work in countries with a high rate of hepatitis A should be immunized. Adults who were previously unvaccinated and who anticipate close contact with an international adoptee during the first 60 days after arrival in the [place] from a country with a high rate of hepatitis A should be immunized.  Hepatitis B vaccine. Adults who wish to be protected from this disease, have certain high-risk conditions, may be exposed to blood or other infectious body fluids, are household contacts or sex partners of hepatitis B positive people, are clients or workers in certain care facilities, or travel to or work in countries with a high rate of hepatitis B  should be immunized.  Haemophilus influenzae type b (Hib) vaccine. A previously unvaccinated person with asplenia or sickle cell disease or having a scheduled splenectomy should receive 1 dose of Hib vaccine. Regardless of previous immunization, a recipient of a hematopoietic stem cell transplant should receive a 3-dose series 6 12 months after her successful transplant. Hib vaccine is not recommended for adults with HIV infection.   Neta Mends. Panosh M.D.   Health Maintenance  Topic Date Due  . Influenza Vaccine  10/31/2013  . Pap Smear  12/03/2014  . Tetanus/tdap  04/03/2020   Health Maintenance Review

## 2013-02-04 NOTE — Patient Instructions (Addendum)
Can try low dose fluoxetine  10 per day  . Usual dose is 20 mg per day.  Other time and good sleep if also helpful. ROV in about  4 weeks for med evaluation   150 minutes of exercise weeks  ,  Lose weight  To healthy levels. Avoid trans fats and processed foods;  Increase fresh fruits and veges to 5 servings per day. And avoid sweet beverages  Including tea and juice.  Will notify you  of labs when available. Disc abd cramps and periods with [facility] Healthy weight loss can help you lipid profile.     Preventive Care for Adults, Female A healthy lifestyle and preventive care can promote health and wellness. Preventive health guidelines for women include the following key practices.  A routine yearly physical is a good way to check with your caregiver about your health and preventive screening. It is a chance to share any concerns and updates on your health, and to receive a thorough exam.  Visit your dentist for a routine exam and preventive care every 6 months. Brush your teeth twice a day and floss once a day. Good oral hygiene prevents tooth decay and gum disease.  The frequency of eye exams is based on your age, health, family medical history, use of contact lenses, and other factors. Follow your caregiver's recommendations for frequency of eye exams.  Eat a healthy diet. Foods like vegetables, fruits, whole grains, low-fat dairy products, and lean protein foods contain the nutrients you need without too many calories. Decrease your intake of foods high in solid fats, added sugars, and salt. Eat the right amount of calories for you.Get information about a proper diet from your caregiver, if necessary.  Regular physical exercise is one of the most important things you can do for your health. Most adults should get at least 150 minutes of moderate-intensity exercise (any activity that increases your heart rate and causes you to sweat) each week. In addition, most adults need  muscle-strengthening exercises on 2 or more days a week.  Maintain a healthy weight. The body mass index (BMI) is a screening tool to identify possible weight problems. It provides an estimate of body fat based on height and weight. Your caregiver can help determine your BMI, and can help you achieve or maintain a healthy weight.For adults 20 years and older:  A BMI below 18.5 is considered underweight.  A BMI of 18.5 to 24.9 is normal.  A BMI of 25 to 29.9 is considered overweight.  A BMI of 30 and above is considered obese.  Maintain normal blood lipids and cholesterol levels by exercising and minimizing your intake of saturated fat. Eat a balanced diet with plenty of fruit and vegetables. Blood tests for lipids and cholesterol should begin at age 16 and be repeated every 5 years. If your lipid or cholesterol levels are high, you are over 50, or you are at high risk for heart disease, you may need your cholesterol levels checked more frequently.Ongoing high lipid and cholesterol levels should be treated with medicines if diet and exercise are not effective.  If you smoke, find out from your caregiver how to quit. If you do not use tobacco, do not start.  Lung cancer screening is recommended for adults aged 35 80 years who are at high risk for developing lung cancer because of a history of smoking. Yearly low-dose computed tomography (CT) is recommended for people who have at least a 30-pack-year history of smoking and are  a current smoker or have quit within the past 15 years. A pack year of smoking is smoking an average of 1 pack of cigarettes a day for 1 year (for example: 1 pack a day for 30 years or 2 packs a day for 15 years). Yearly screening should continue until the smoker has stopped smoking for at least 15 years. Yearly screening should also be stopped for people who develop a health problem that would prevent them from having lung cancer treatment.  If you are pregnant, do not drink  alcohol. If you are breastfeeding, be very cautious about drinking alcohol. If you are not pregnant and choose to drink alcohol, do not exceed 1 drink per day. One drink is considered to be 12 ounces (355 mL) of beer, 5 ounces (148 mL) of wine, or 1.5 ounces (44 mL) of liquor.  Avoid use of street drugs. Do not share needles with anyone. Ask for help if you need support or instructions about stopping the use of drugs.  High blood pressure causes heart disease and increases the risk of stroke. Your blood pressure should be checked at least every 1 to 2 years. Ongoing high blood pressure should be treated with medicines if weight loss and exercise are not effective.  If you are 32 to 38 years old, ask your caregiver if you should take aspirin to prevent strokes.  Diabetes screening involves taking a blood sample to check your fasting blood sugar level. This should be done once every 3 years, after age 10, if you are within normal weight and without risk factors for diabetes. Testing should be considered at a younger age or be carried out more frequently if you are overweight and have at least 1 risk factor for diabetes.  Breast cancer screening is essential preventive care for women. You should practice "breast self-awareness." This means understanding the normal appearance and feel of your breasts and may include breast self-examination. Any changes detected, no matter how small, should be reported to a caregiver. Women in their 30s and 30s should have a clinical breast exam (CBE) by a caregiver as part of a regular health exam every 1 to 3 years. After age 33, women should have a CBE every year. Starting at age 37, women should consider having a mammography (breast X-ray test) every year. Women who have a family history of breast cancer should talk to their caregiver about genetic screening. Women at a high risk of breast cancer should talk to their caregivers about having magnetic resonance imaging (MRI)  and a mammography every year.  Breast cancer gene (BRCA)-related cancer risk assessment is recommended for women who have family members with BRCA-related cancers. BRCA-related cancers include breast, ovarian, tubal, and peritoneal cancers. Having family members with these cancers may be associated with an increased risk for harmful changes (mutations) in the breast cancer genes BRCA1 and BRCA2. Results of the assessment will determine the need for genetic counseling and BRCA1 and BRCA2 testing.  The Pap test is a screening test for cervical cancer. A Pap test can show cell changes on the cervix that might become cervical cancer if left untreated. A Pap test is a procedure in which cells are obtained and examined from the lower end of the uterus (cervix).  Women should have a Pap test starting at age 6.  Between ages 28 and 32, Pap tests should be repeated every 2 years.  Beginning at age 46, you should have a Pap test every 3 years as long  as the past 3 Pap tests have been normal.  Some women have medical problems that increase the chance of getting cervical cancer. Talk to your caregiver about these problems. It is especially important to talk to your caregiver if a new problem develops soon after your last Pap test. In these cases, your caregiver may recommend more frequent screening and Pap tests.  The above recommendations are the same for women who have or have not gotten the vaccine for human papillomavirus (HPV).  If you had a hysterectomy for a problem that was not cancer or a condition that could lead to cancer, then you no longer need Pap tests. Even if you no longer need a Pap test, a regular exam is a good idea to make sure no other problems are starting.  If you are between ages 48 and 38, and you have had normal Pap tests going back 10 years, you no longer need Pap tests. Even if you no longer need a Pap test, a regular exam is a good idea to make sure no other problems are  starting.  If you have had past treatment for cervical cancer or a condition that could lead to cancer, you need Pap tests and screening for cancer for at least 20 years after your treatment.  If Pap tests have been discontinued, risk factors (such as a new sexual partner) need to be reassessed to determine if screening should be resumed.  The HPV test is an additional test that may be used for cervical cancer screening. The HPV test looks for the virus that can cause the cell changes on the cervix. The cells collected during the Pap test can be tested for HPV. The HPV test could be used to screen women aged 57 years and older, and should be used in women of any age who have unclear Pap test results. After the age of 63, women should have HPV testing at the same frequency as a Pap test.  Colorectal cancer can be detected and often prevented. Most routine colorectal cancer screening begins at the age of 77 and continues through age 45. However, your caregiver may recommend screening at an earlier age if you have risk factors for colon cancer. On a yearly basis, your caregiver may provide home test kits to check for hidden blood in the stool. Use of a small camera at the end of a tube, to directly examine the colon (sigmoidoscopy or colonoscopy), can detect the earliest forms of colorectal cancer. Talk to your caregiver about this at age 14, when routine screening begins. Direct examination of the colon should be repeated every 5 to 10 years through age 75, unless early forms of pre-cancerous polyps or small growths are found.  Hepatitis C blood testing is recommended for all people born from [birth year] through 1966 and any individual with known risks for hepatitis C.  Practice safe sex. Use condoms and avoid high-risk sexual practices to reduce the spread of sexually transmitted infections (STIs). STIs include gonorrhea, chlamydia, syphilis, trichomonas, herpes, HPV, and human immunodeficiency virus (HIV).  Herpes, HIV, and HPV are viral illnesses that have no cure. They can result in disability, cancer, and death. Sexually active women aged 74 and younger should be checked for chlamydia. Older women with new or multiple partners should also be tested for chlamydia. Testing for other STIs is recommended if you are sexually active and at increased risk.  Osteoporosis is a disease in which the bones lose minerals and strength with aging. This  can result in serious bone fractures. The risk of osteoporosis can be identified using a bone density scan. Women ages 19 and over and women at risk for fractures or osteoporosis should discuss screening with their caregivers. Ask your caregiver whether you should take a calcium supplement or vitamin D to reduce the rate of osteoporosis.  Menopause can be associated with physical symptoms and risks. Hormone replacement therapy is available to decrease symptoms and risks. You should talk to your caregiver about whether hormone replacement therapy is right for you.  Use sunscreen. Apply sunscreen liberally and repeatedly throughout the day. You should seek shade when your shadow is shorter than you. Protect yourself by wearing long sleeves, pants, a wide-brimmed hat, and sunglasses year round, whenever you are outdoors.  Once a month, do a whole body skin exam, using a mirror to look at the skin on your back. Notify your caregiver of new moles, moles that have irregular borders, moles that are larger than a pencil eraser, or moles that have changed in shape or color.  Stay current with required immunizations.  Influenza vaccine. All adults should be immunized every year.  Tetanus, diphtheria, and acellular pertussis (Td, Tdap) vaccine. Pregnant women should receive 1 dose of Tdap vaccine during each pregnancy. The dose should be obtained regardless of the length of time since the last dose. Immunization is preferred during the 27th to 36th week of gestation. An adult who  has not previously received Tdap or who does not know her vaccine status should receive 1 dose of Tdap. This initial dose should be followed by tetanus and diphtheria toxoids (Td) booster doses every 10 years. Adults with an unknown or incomplete history of completing a 3-dose immunization series with Td-containing vaccines should begin or complete a primary immunization series including a Tdap dose. Adults should receive a Td booster every 10 years.  Varicella vaccine. An adult without evidence of immunity to varicella should receive 2 doses or a second dose if she has previously received 1 dose. Pregnant females who do not have evidence of immunity should receive the first dose after pregnancy. This first dose should be obtained before leaving the health care facility. The second dose should be obtained 4 8 weeks after the first dose.  Human papillomavirus (HPV) vaccine. Females aged 28 26 years who have not received the vaccine previously should obtain the 3-dose series. The vaccine is not recommended for use in pregnant females. However, pregnancy testing is not needed before receiving a dose. If a female is found to be pregnant after receiving a dose, no treatment is needed. In that case, the remaining doses should be delayed until after the pregnancy. Immunization is recommended for any person with an immunocompromised condition through the age of 26 years if she did not get any or all doses earlier. During the 3-dose series, the second dose should be obtained 4 8 weeks after the first dose. The third dose should be obtained 24 weeks after the first dose and 16 weeks after the second dose.  Zoster vaccine. One dose is recommended for adults aged 7 years or older unless certain conditions are present.  Measles, mumps, and rubella (MMR) vaccine. Adults born before [birth year] generally are considered immune to measles and mumps. Adults born in [birth year] or later should have 1 or more doses of MMR vaccine unless  there is a contraindication to the vaccine or there is laboratory evidence of immunity to each of the three diseases. A routine second dose of  MMR vaccine should be obtained at least 28 days after the first dose for students attending postsecondary schools, health care workers, or international travelers. People who received inactivated measles vaccine or an unknown type of measles vaccine during 1963 1967 should receive 2 doses of MMR vaccine. People who received inactivated mumps vaccine or an unknown type of mumps vaccine before 1979 and are at high risk for mumps infection should consider immunization with 2 doses of MMR vaccine. For females of childbearing age, rubella immunity should be determined. If there is no evidence of immunity, females who are not pregnant should be vaccinated. If there is no evidence of immunity, females who are pregnant should delay immunization until after pregnancy. Unvaccinated health care workers born before [birth year] who lack laboratory evidence of measles, mumps, or rubella immunity or laboratory confirmation of disease should consider measles and mumps immunization with 2 doses of MMR vaccine or rubella immunization with 1 dose of MMR vaccine.  Pneumococcal 13-valent conjugate (PCV13) vaccine. When indicated, a person who is uncertain of her immunization history and has no record of immunization should receive the PCV13 vaccine. An adult aged 58 years or older who has certain medical conditions and has not been previously immunized should receive 1 dose of PCV13 vaccine. This PCV13 should be followed with a dose of pneumococcal polysaccharide (PPSV23) vaccine. The PPSV23 vaccine dose should be obtained at least 8 weeks after the dose of PCV13 vaccine. An adult aged 66 years or older who has certain medical conditions and previously received 1 or more doses of PPSV23 vaccine should receive 1 dose of PCV13. The PCV13 vaccine dose should be obtained 1 or more years after the last  PPSV23 vaccine dose.  Pneumococcal polysaccharide (PPSV23) vaccine. When PCV13 is also indicated, PCV13 should be obtained first. All adults aged 60 years and older should be immunized. An adult younger than age 96 years who has certain medical conditions should be immunized. Any person who resides in a nursing home or long-term care facility should be immunized. An adult smoker should be immunized. People with an immunocompromised condition and certain other conditions should receive both PCV13 and PPSV23 vaccines. People with human immunodeficiency virus (HIV) infection should be immunized as soon as possible after diagnosis. Immunization during chemotherapy or radiation therapy should be avoided. Routine use of PPSV23 vaccine is not recommended for American Indians, [address], or people younger than 65 years unless there are medical conditions that require PPSV23 vaccine. When indicated, people who have unknown immunization and have no record of immunization should receive PPSV23 vaccine. One-time revaccination 5 years after the first dose of PPSV23 is recommended for people aged 76 64 years who have chronic kidney failure, nephrotic syndrome, asplenia, or immunocompromised conditions. People who received 1 2 doses of PPSV23 before age 70 years should receive another dose of PPSV23 vaccine at age 88 years or later if at least 5 years have passed since the previous dose. Doses of PPSV23 are not needed for people immunized with PPSV23 at or after age 69 years.  Meningococcal vaccine. Adults with asplenia or persistent complement component deficiencies should receive 2 doses of quadrivalent meningococcal conjugate (MenACWY-D) vaccine. The doses should be obtained at least 2 months apart. Microbiologists working with certain meningococcal bacteria, military recruits, people at risk during an outbreak, and people who travel to or live in countries with a high rate of meningitis should be immunized. A  first-year college student up through age 44 years who is living in a  residence hall should receive a dose if she did not receive a dose on or after her 16th birthday. Adults who have certain high-risk conditions should receive one or more doses of vaccine.  Hepatitis A vaccine. Adults who wish to be protected from this disease, have certain high-risk conditions, work with hepatitis A-infected animals, work in hepatitis A research labs, or travel to or work in countries with a high rate of hepatitis A should be immunized. Adults who were previously unvaccinated and who anticipate close contact with an international adoptee during the first 60 days after arrival in the [place] from a country with a high rate of hepatitis A should be immunized.  Hepatitis B vaccine. Adults who wish to be protected from this disease, have certain high-risk conditions, may be exposed to blood or other infectious body fluids, are household contacts or sex partners of hepatitis B positive people, are clients or workers in certain care facilities, or travel to or work in countries with a high rate of hepatitis B should be immunized.  Haemophilus influenzae type b (Hib) vaccine. A previously unvaccinated person with asplenia or sickle cell disease or having a scheduled splenectomy should receive 1 dose of Hib vaccine. Regardless of previous immunization, a recipient of a hematopoietic stem cell transplant should receive a 3-dose series 6 12 months after her successful transplant. Hib vaccine is not recommended for adults with HIV infection.

## 2013-02-06 ENCOUNTER — Telehealth: Payer: Self-pay | Admitting: Internal Medicine

## 2013-02-06 NOTE — Telephone Encounter (Signed)
Spoke to the pt.  She denies any sx at this time.  Took some Tylenol and pain went away.  She will call back or go to ER is sx return.

## 2013-02-06 NOTE — Telephone Encounter (Signed)
Triage Call Report Triage Record Num: [id number] Operator: Remonia Richter Patient Name: [facility] Call Date & Time: 02/05/2013 7:54:31PM Patient Phone: [phone] PCP: Neta Mends. Panosh Patient Gender: Female PCP Fax : [phone] Patient DOB: 08/09/1975 [facility] Name: [facility] Reason for Call: Caller: Tatanisha/Patient; PCP: Berniece Andreas (Family Practice); CB#: [phone]; Call regarding Headache with left side a bit numb to arm and shoulder with heaviness sensation to left side , left eye is irritated or pulling, flu shot 02/04/13, 911 disposition obtained per Numbness Guideline due to one sided numbness, she was given 911 disposition with explanationa nd stated she was not having any stroke symptoms, re-esplained that she should go for eval and she stated OK Protocol(s) Used: Numbness or Tingling Recommended Outcome per Protocol: Activate EMS 911 Reason for Outcome: New numbness/tingling associated with weakness or paralysis (unable to move) involving face, arm or leg, especially on same side of body, or loss of coordination (purposeful action) occurring now or within last 4 hours. Care Advice: ~ Protect the patient from falling or other harm. ~ Do not give the patient anything to eat or drink. ~ IMMEDIATE ACTION Write down provider's name. List or place the following in a bag for transport with the patient: current prescription and/or nonprescription medications; alternative treatments, therapies and medications; and street drugs. ~ An adult should stay with the patient, preferably one trained in CPR. If the person is not trained in CPR, then he or she should provide hands-only (compression-only) CPR as recommended by the [employer].

## 2013-03-05 ENCOUNTER — Ambulatory Visit: Payer: 59 | Admitting: Internal Medicine

## 2013-03-17 ENCOUNTER — Telehealth: Payer: Self-pay | Admitting: Internal Medicine

## 2013-03-17 NOTE — Telephone Encounter (Signed)
Left message at the below listed number for the pt to return my call. 

## 2013-03-17 NOTE — Telephone Encounter (Signed)
Scheduled with BB on 03/18/13.

## 2013-03-17 NOTE — Telephone Encounter (Signed)
Patient Information:  Caller Name: [facility]  Phone: [phone]  Patient: Autumn Cruz, Autumn Cruz  Gender: Female  DOB: 22-Jan-1976  Age: 37 Years  PCP: Berniece Andreas (Family Practice)  Pregnant: No  Office Follow Up:  Does the office need to follow up with this patient?: Yes  Instructions For The Office: needs a call back about an appt; none in epic today; has a cough since starting Prozac   Symptoms  Reason For Call & Symptoms: cough started 2wks ago; had a cold when she started Prozac 10mg ; feels the cold is gone, but is still coughing; takes the Prozac at night; says she feels SOB at night; has a constant cough with some chest tightness  Reviewed Health History In EMR: Yes  Reviewed Medications In EMR: Yes  Reviewed Allergies In EMR: Yes  Reviewed Surgeries / Procedures: Yes  Date of Onset of Symptoms: Unknown OB / GYN:  LMP: 03/17/2013  Guideline(s) Used:  Cough  Disposition Per Guideline:   Go to Office Now  Reason For Disposition Reached:   Wheezing is present  Advice Given:  N/A  Patient Will Follow Care Advice:  YES

## 2013-03-17 NOTE — Telephone Encounter (Signed)
Spoke to the pt.  She started Prozac the middle of Nov.  Now in the middle of Dec.  Explained that I did not think this was the medication.  Pt has a cough that is dry, itchy throat, chest tightness with some wheezing.  Pt denied having any difficulty breathing.  Cough keeps her up at night.  Informed the patient that she needed to be seen in the office.  Sent to scheduling for an appt.

## 2013-03-18 ENCOUNTER — Encounter: Payer: Self-pay | Admitting: Family Medicine

## 2013-03-18 ENCOUNTER — Ambulatory Visit (INDEPENDENT_AMBULATORY_CARE_PROVIDER_SITE_OTHER): Payer: 59 | Admitting: Family Medicine

## 2013-03-18 VITALS — BP 124/80 | HR 94 | Temp 97.8°F | Wt 200.0 lb

## 2013-03-18 DIAGNOSIS — R05 Cough: Secondary | ICD-10-CM

## 2013-03-18 MED ORDER — HYDROCODONE-HOMATROPINE 5-1.5 MG/5ML PO SYRP: 5.0000 mL | ORAL_SOLUTION | Freq: Four times a day (QID) | ORAL | Status: AC | PRN

## 2013-03-18 MED ORDER — ALBUTEROL SULFATE HFA 108 (90 BASE) MCG/ACT IN AERS: 2.0000 | INHALATION_SPRAY | RESPIRATORY_TRACT | Status: DC | PRN

## 2013-03-18 NOTE — Progress Notes (Signed)
   Subjective:    Patient ID: Autumn Cruz, female    DOB: 1975/04/28, 37 y.o.   MRN: [id number]  HPI Acute visit. The patient is seen with onset of cold-like symptoms about 3 weeks ago. She presented then with cough, sore throat, nasal congestion. Those symptoms eventually improved and for 2 weeks now she's had mostly dry cough. Question of some wheezing at night though none noted at this time. Patient was concerned because she was recently started on Prozac and was concerned her cough may be due to allergic reaction. She has not had any skin rash. Her cough is worse at night and is preventing sleep. She's tried over-the-counter cough suppressants with Robitussin which has not helped. She has no history of asthma. Nonsmoker. No pleuritic pain. No hemoptysis. Denies fever or chills. No postnasal drip or GERD symptoms.  Past Medical History  Diagnosis Date  . Gestational diabetes   . High cholesterol   . Positive TB test   . Thrombosis     Left leg preg tulip filter then removed.   Marland Kitchen Hx of thrombosis 2014    Left lower extrm.  Marland Kitchen Post-phlebitic syndrome 2014    Left  LE  . Tuberculosis     Positive test  . DVT (deep venous thrombosis)    Past Surgical History  Procedure Laterality Date  . Cesarean section  2010 & 2012    reports that she has never smoked. She has never used smokeless tobacco. She reports that she does not drink alcohol or use illicit drugs. family history includes Coronary artery disease in her mother; Deep vein thrombosis in her father; Diabetes in her mother; Heart disease in her mother; Hyperlipidemia in her mother; Hypertension in her father. No Known Allergies    Review of Systems  Constitutional: Negative for fever and chills.  HENT: Negative for congestion and postnasal drip.   Respiratory: Positive for cough. Negative for shortness of breath.        Objective:   Physical Exam  Constitutional: She appears well-developed and well-nourished.  HENT:  Right  Ear: External ear normal.  Left Ear: External ear normal.  Mouth/Throat: Oropharynx is clear and moist.  Neck: Neck supple.  Cardiovascular: Normal rate and regular rhythm.   Pulmonary/Chest: Effort normal and breath sounds normal. No respiratory distress. She has no wheezes. She has no rales.  Lymphadenopathy:    She has no cervical adenopathy.          Assessment & Plan:  Cough. Suspect post viral bronchitis. Nonfocal exam with no wheezing at this time. Pulse oximetry 97-98%. Cough suppressant with Hycodan to use at night as needed. We did refill ProAir if she has any wheezing. We explained we did not think this is likely related her Prozac therapy

## 2013-03-18 NOTE — Progress Notes (Signed)
Pre visit review using our clinic review tool, if applicable. No additional management support is needed unless otherwise documented below in the visit note. 

## 2013-03-18 NOTE — Patient Instructions (Signed)
Follow up promptly for any fever or increased shortness of breath. 

## 2013-03-25 ENCOUNTER — Encounter: Payer: 59 | Admitting: Internal Medicine

## 2013-03-27 ENCOUNTER — Ambulatory Visit: Payer: 59 | Admitting: Internal Medicine

## 2013-04-07 ENCOUNTER — Ambulatory Visit (INDEPENDENT_AMBULATORY_CARE_PROVIDER_SITE_OTHER): Payer: 59 | Admitting: Internal Medicine

## 2013-04-07 ENCOUNTER — Encounter: Payer: Self-pay | Admitting: Internal Medicine

## 2013-04-07 VITALS — BP 108/58 | HR 88 | Temp 98.6°F | Wt 205.0 lb

## 2013-04-07 DIAGNOSIS — D6851 Activated protein C resistance: Secondary | ICD-10-CM

## 2013-04-07 DIAGNOSIS — D6859 Other primary thrombophilia: Secondary | ICD-10-CM

## 2013-04-07 DIAGNOSIS — E785 Hyperlipidemia, unspecified: Secondary | ICD-10-CM

## 2013-04-07 DIAGNOSIS — F4320 Adjustment disorder, unspecified: Secondary | ICD-10-CM

## 2013-04-07 DIAGNOSIS — R6889 Other general symptoms and signs: Secondary | ICD-10-CM

## 2013-04-07 DIAGNOSIS — Z7901 Long term (current) use of anticoagulants: Secondary | ICD-10-CM

## 2013-04-07 DIAGNOSIS — R7989 Other specified abnormal findings of blood chemistry: Secondary | ICD-10-CM

## 2013-04-07 LAB — T4, FREE: Free T4: 0.67 ng/dL (ref 0.60–1.60)

## 2013-04-07 LAB — TSH: TSH: 1.38 u[IU]/mL (ref 0.35–5.50)

## 2013-04-07 LAB — T3, FREE: T3, Free: 2.8 pg/mL (ref 2.3–4.2)

## 2013-04-07 MED ORDER — ATORVASTATIN CALCIUM 40 MG PO TABS: 40.0000 mg | ORAL_TABLET | Freq: Every day | ORAL | Status: DC

## 2013-04-07 NOTE — Patient Instructions (Addendum)
Consider  Getting  IUD . Continue fluoxetine .  for now  Trial of lipitor generic for the  Cholesterol   Repeat lipid panel in 2-3 months  Continue lifestyle intervention healthy eating and exercise . ase we discussed . Repeat thyroid tests today .

## 2013-04-07 NOTE — Progress Notes (Signed)
Chief Complaint  Patient presents with  . Follow-up    HPI: Patient comes in today for follow up of  multiple medical problems.  Here with daughter  No new problems except  4 days an back  Itching throat cough  Scratching throat.   ? If prozac went off ? Cold  Or med  Helping period some . And mood is stable and is more setteled .   ROS: See pertinent positives and negatives per HPI. No current new cp ob bleeding   Past Medical History  Diagnosis Date  . Gestational diabetes   . High cholesterol   . Positive TB test   . Thrombosis     Left leg preg tulip filter then removed.   Marland Kitchen. Hx of thrombosis 2014    Left lower extrm.  Marland Kitchen. Post-phlebitic syndrome 2014    Left  LE  . Tuberculosis     Positive test  . DVT (deep venous thrombosis)     Family History  Problem Relation Age of Onset  . Heart disease Mother     Blockage/Main artery  . Diabetes Mother   . Coronary artery disease Mother   . Hyperlipidemia Mother   . Hypertension Father   . Deep vein thrombosis Father     History   Social History  . Marital Status: Married    Spouse Name: [name]    Number of Children: 2  . Years of Education: N/A   Occupational History  . Homemaker    Social History Main Topics  . Smoking status: Never Smoker   . Smokeless tobacco: Never Used  . Alcohol Use: No  . Drug Use: No  . Sexual Activity: None   Other Topics Concern  . None   Social History Narrative   Usually gets 6-7 hours of sleep per night   4 people living in the home.   Originally from [place] has a bachelor's degree some training in nursing and alternative medicine.   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   Negative tobacco alcohol some caffeine   Gravida 2 para 2 last Pap 2013   Think she had T. 2012    Outpatient Encounter Prescriptions as of 04/07/2013  Medication Sig  . albuterol  (PROAIR HFA) 108 (90 BASE) MCG/ACT inhaler Inhale 2 puffs into the lungs every 4 (four) hours as needed for wheezing or shortness of breath.  . enoxaparin (LOVENOX) 40 MG/0.4ML injection Inject 40 mg into the skin daily.  Marland Kitchen. FLUoxetine (PROZAC) 10 MG capsule Take 1 capsule (10 mg total) by mouth daily.  . Multiple Vitamin (MULTI-VITAMIN PO) Take by mouth.  Marland Kitchen. atorvastatin (LIPITOR) 40 MG tablet Take 1 tablet (40 mg total) by mouth daily.    EXAM:  BP 108/58  Pulse 88  Temp(Src) 98.6 F (37 C) (Oral)  Wt 205 lb (92.987 kg)  SpO2 98%  Body mass index is 34.91 kg/(m^2). Wt Readings from Last 3 Encounters:  04/07/13 205 lb (92.987 kg)  03/18/13 200 lb (90.719 kg)  02/04/13 196 lb (88.905 kg)    GENERAL: vitals reviewed and listed above, alert, oriented, appears well hydrated and in no acute distress mildy congested hoarse  HEENT: atraumatic, conjunctiva  clear, no obvious abnormalities on inspection of external nose and ears OP : no lesion edema or exudate  NECK: no obvious masses on inspection palpation  LUNGS: clear  to auscultation bilaterally, no wheezes, rales or rhonchi, good air movement CV: HRRR, no clubbing cyanosis  nl cap refill  MS: moves all extremities without noticeable focal  abnormality PSYCH: pleasant and cooperative, no obvious depression or anxiety Lab Results  Component Value Date   WBC 11.9* 02/04/2013   HGB 14.2 02/04/2013   HCT 42.2 02/04/2013   PLT 228.0 02/04/2013   GLUCOSE 86 02/04/2013   CHOL 257* 02/04/2013   TRIG 87.0 02/04/2013   HDL 52.40 02/04/2013   LDLDIRECT 200.7 02/04/2013   ALT 35 02/04/2013   AST 25 02/04/2013   NA 135 02/04/2013   K 3.7 02/04/2013   CL 98 02/04/2013   CREATININE 0.8 02/04/2013   BUN 12 02/04/2013   CO2 28 02/04/2013   TSH 1.38 04/07/2013   HGBA1C 5.8 02/04/2013    ASSESSMENT AND PLAN:  Discussed the following assessment and plan:  Abnormal TSH - Plan: TSH, T4, free, T3, free  Hyperlipidemia - ldl in  statin rx range by  guidelines not planning pregnancy  - Plan: TSH, T4, free, T3, free  Long term (current) use of anticoagulants  Factor V Leiden mutation  Adjustment reaction - doing much better ? if se of prozac ? tickle in throat will follow   Hypercoagulable state - fu dr g agree that  anticoag in high risk situations but otherwose none  Risk benefit of medication discussed. Of statins etc  Trial statin  Repeat thyroid tests .  -Patient advised to return or notify health care team  if symptoms worsen or persist or new concerns arise.  Patient Instructions  Consider  Getting  IUD . Continue fluoxetine .  for now  Trial of lipitor generic for the  Cholesterol   Repeat lipid panel in 2-3 months  Continue lifestyle intervention healthy eating and exercise . ase we discussed . Repeat thyroid tests today .    Neta Mends. Panosh M.D.  Pre visit review using our clinic review tool, if applicable. No additional management support is needed unless otherwise documented below in the visit note.

## 2013-04-11 DIAGNOSIS — R7989 Other specified abnormal findings of blood chemistry: Secondary | ICD-10-CM | POA: Insufficient documentation

## 2013-04-13 ENCOUNTER — Encounter: Payer: Self-pay | Admitting: Family Medicine

## 2013-05-12 ENCOUNTER — Ambulatory Visit: Payer: 59 | Admitting: Oncology

## 2013-05-31 ENCOUNTER — Encounter: Payer: Self-pay | Admitting: Oncology

## 2013-09-21 ENCOUNTER — Telehealth: Payer: Self-pay | Admitting: Internal Medicine

## 2013-09-21 NOTE — Telephone Encounter (Signed)
Uncertain of her lovenox status . Thought she was following up with dr Reece AgarG but i know he changed practices.  Please define how she is taking this   Thought it would be prn for high risk  Situations . When did she run out.  And how is she dosing this ?

## 2013-09-21 NOTE — Telephone Encounter (Signed)
Are you filling or Dr. Cyndie ChimeGranfortuna?

## 2013-09-21 NOTE — Telephone Encounter (Signed)
Pt request refill of enoxaparin (LOVENOX) 40 MG/0.4ML injection One mo supply [facility] [address]

## 2013-09-22 MED ORDER — ENOXAPARIN SODIUM 40 MG/0.4ML ~~LOC~~ SOLN: 40.0000 mg | SUBCUTANEOUS | Status: DC

## 2013-09-22 NOTE — Telephone Encounter (Signed)
Ok to disp 3 boxes of 10 refill x 1

## 2013-09-22 NOTE — Telephone Encounter (Signed)
Spoke to the pt.  She said she gets prefilled injections from the pharmacy.  They come in a box of 10.  She is requesting 3 boxes. Please advise.  Thanks!

## 2013-09-22 NOTE — Telephone Encounter (Signed)
Patient notified to pick up at the pharmacy. 

## 2013-11-02 ENCOUNTER — Telehealth: Payer: Self-pay | Admitting: Internal Medicine

## 2013-11-02 NOTE — Telephone Encounter (Signed)
Pt has pain in her right foot at the back heel (not the same leg as the dvt)   Pt thinks this is something in the bone. pt would like to know if you think she should go to podiatrist? Can you recommend anyone? Pt would like some assistance getting appt asap due to the pain is very bad

## 2013-11-02 NOTE — Telephone Encounter (Signed)
Patient should talk to triage nurse/CAN.  Please assist her with this.

## 2013-11-03 NOTE — Telephone Encounter (Signed)
Pt aware to speak w/ triage, transferred pt to triage nurse

## 2014-02-01 ENCOUNTER — Encounter: Payer: Self-pay | Admitting: Internal Medicine

## 2014-05-19 ENCOUNTER — Other Ambulatory Visit (INDEPENDENT_AMBULATORY_CARE_PROVIDER_SITE_OTHER): Payer: 59

## 2014-05-19 DIAGNOSIS — Z0000 Encounter for general adult medical examination without abnormal findings: Secondary | ICD-10-CM

## 2014-05-19 LAB — BASIC METABOLIC PANEL
BUN: 10 mg/dL (ref 6–23)
CALCIUM: 9.9 mg/dL (ref 8.4–10.5)
CO2: 29 meq/L (ref 19–32)
CREATININE: 0.77 mg/dL (ref 0.40–1.20)
Chloride: 103 mEq/L (ref 96–112)
GFR: 88.33 mL/min (ref >60.00)
Glucose, Bld: 88 mg/dL (ref 70–99)
Potassium: 4.3 mEq/L (ref 3.5–5.1)
Sodium: 139 mEq/L (ref 135–145)

## 2014-05-19 LAB — CBC WITH DIFFERENTIAL/PLATELET
BASOS ABS: 0 10*3/uL (ref 0.0–0.1)
Basophils Relative: 0.5 % (ref 0.0–3.0)
Eosinophils Absolute: 0.1 10*3/uL (ref 0.0–0.7)
Eosinophils Relative: 1.8 % (ref 0.0–5.0)
HEMATOCRIT: 43.5 % (ref 36.0–46.0)
Hemoglobin: 14.8 g/dL (ref 12.0–15.0)
LYMPHS ABS: 2.8 10*3/uL (ref 0.7–4.0)
Lymphocytes Relative: 44.5 % (ref 12.0–46.0)
MCHC: 34 g/dL (ref 30.0–36.0)
MCV: 92 fl (ref 78.0–100.0)
MONO ABS: 0.4 10*3/uL (ref 0.1–1.0)
Monocytes Relative: 6 % (ref 3.0–12.0)
Neutro Abs: 3 10*3/uL (ref 1.4–7.7)
Neutrophils Relative %: 47.2 % (ref 43.0–77.0)
Platelets: 216 10*3/uL (ref 150.0–400.0)
RBC: 4.73 Mil/uL (ref 3.87–5.11)
RDW: 12.4 % (ref 11.5–15.5)
WBC: 6.3 10*3/uL (ref 4.0–10.5)

## 2014-05-19 LAB — LIPID PANEL
CHOLESTEROL: 231 mg/dL — AB (ref 0–200)
HDL: 50.7 mg/dL (ref >39.00)
LDL CALC: 161 mg/dL — AB (ref 0–99)
NonHDL: 180.3
TRIGLYCERIDES: 98 mg/dL (ref 0.0–149.0)
Total CHOL/HDL Ratio: 5
VLDL: 19.6 mg/dL (ref 0.0–40.0)

## 2014-05-19 LAB — HEPATIC FUNCTION PANEL
ALT: 39 U/L — AB (ref 0–35)
AST: 28 U/L (ref 0–37)
Albumin: 4.4 g/dL (ref 3.5–5.2)
Alkaline Phosphatase: 66 U/L (ref 39–117)
Bilirubin, Direct: 0.2 mg/dL (ref 0.0–0.3)
TOTAL PROTEIN: 8.1 g/dL (ref 6.0–8.3)
Total Bilirubin: 1.3 mg/dL — ABNORMAL HIGH (ref 0.2–1.2)

## 2014-05-19 LAB — TSH: TSH: 0.94 u[IU]/mL (ref 0.35–4.50)

## 2014-05-26 ENCOUNTER — Encounter: Payer: Self-pay | Admitting: Internal Medicine

## 2014-05-28 ENCOUNTER — Ambulatory Visit (INDEPENDENT_AMBULATORY_CARE_PROVIDER_SITE_OTHER): Payer: 59 | Admitting: Internal Medicine

## 2014-05-28 ENCOUNTER — Encounter: Payer: Self-pay | Admitting: Internal Medicine

## 2014-05-28 VITALS — BP 108/72 | Temp 98.6°F | Ht 64.0 in | Wt 190.3 lb

## 2014-05-28 DIAGNOSIS — R7989 Other specified abnormal findings of blood chemistry: Secondary | ICD-10-CM

## 2014-05-28 DIAGNOSIS — Z23 Encounter for immunization: Secondary | ICD-10-CM

## 2014-05-28 DIAGNOSIS — F4323 Adjustment disorder with mixed anxiety and depressed mood: Secondary | ICD-10-CM

## 2014-05-28 DIAGNOSIS — R945 Abnormal results of liver function studies: Secondary | ICD-10-CM

## 2014-05-28 DIAGNOSIS — Z6831 Body mass index (BMI) 31.0-31.9, adult: Secondary | ICD-10-CM

## 2014-05-28 DIAGNOSIS — Z0000 Encounter for general adult medical examination without abnormal findings: Secondary | ICD-10-CM

## 2014-05-28 DIAGNOSIS — L989 Disorder of the skin and subcutaneous tissue, unspecified: Secondary | ICD-10-CM

## 2014-05-28 DIAGNOSIS — G4700 Insomnia, unspecified: Secondary | ICD-10-CM

## 2014-05-28 DIAGNOSIS — L299 Pruritus, unspecified: Secondary | ICD-10-CM

## 2014-05-28 DIAGNOSIS — E785 Hyperlipidemia, unspecified: Secondary | ICD-10-CM

## 2014-05-28 DIAGNOSIS — D6851 Activated protein C resistance: Secondary | ICD-10-CM

## 2014-05-28 MED ORDER — ZALEPLON 5 MG PO CAPS: 5.0000 mg | ORAL_CAPSULE | Freq: Every evening | ORAL | Status: DC | PRN

## 2014-05-28 NOTE — Progress Notes (Signed)
Pre visit review using our clinic review tool, if applicable. No additional management support is needed unless otherwise documented below in the visit note.  Chief Complaint  Patient presents with  . Annual Exam    number of other issues throat itching sleep    HPI: Patient  Autumn Cruz  39 y.o. comes in today for Preventive Health Care visit and some other concerns . She has hx of factor V lein and hx of dvt  Has had 2 separate opinion as to anticoagulation prevention  Dr Cyndie Chime advised asa and as needed lovenox for high risk situations,  And dr Darrick Penna  Vascular surgeon advised lifelong lovenox cause of risk  No hx of recently bleeding. Has decided to do the asa and lovenox at risk time.   Noted some itchy throat recently over weeks  Wondered if could be the asa . So stopped temporarily no hives cough cp sob with this. LIPIDS: ran out of atorva so not on this  Asks about crp test meds knows need to pay attention to ld little exercise except with dog Sleep is off  Talks to  [facility]  Different time zone hard to get to sleep at right time dens depression now some stress  Can be up at 2 am  Stopped th fluoxetine a while back   Has a rash on left le for over 3-4 weeks no trauma ,?  what to use . Not subsiding    Health Maintenance  Topic Date Due  . HIV Screening  07/24/1989  . INFLUENZA VACCINE  11/01/2014  . PAP SMEAR  12/03/2014  . TETANUS/TDAP  04/03/2020   Health Maintenance Review LIFESTYLE:  Exercise:  Walk  Not much   Tobacco/ETS: no Alcohol: no Sugar beverages: no Sleep: interrupted  Drug use: no PAP: utd not planning pregnancy   ROS:  GEN/ HEENT: No fever, significant weight changes sweats headaches vision problems hearing changes, CV/ PULM; No chest pain shortness of breath cough, syncope,edema  change in exercise tolerance. GI /GU: No adominal pain, vomiting, change in bowel habits. No blood in the stool. No significant GU symptoms. SKIN/HEME: ,no  or bleeding. No  lymphadenopathy, nodules, masses.  NEURO/ PSYCH:  No neurologic signs such as weakness numbness. No depression anxiety. IMM/ Allergy: No unusual infections.  Allergy .   REST of 12 system review negative except as per HPI   Past Medical History  Diagnosis Date  . Gestational diabetes   . High cholesterol   . Positive TB test   . Thrombosis     Left leg preg tulip filter then removed.   Marland Kitchen Hx of thrombosis 2014    Left lower extrm.  Marland Kitchen Post-phlebitic syndrome 2014    Left  LE  . Tuberculosis     Positive test  . DVT (deep venous thrombosis)     Past Surgical History  Procedure Laterality Date  . Cesarean section  2010 & 2012    Family History  Problem Relation Age of Onset  . Heart disease Mother     Blockage/Main artery  . Diabetes Mother   . Coronary artery disease Mother   . Hyperlipidemia Mother   . Hypertension Father   . Deep vein thrombosis Father     History   Social History  . Marital Status: Married    Spouse Name: [name]  . Number of Children: 2  . Years of Education: N/A   Occupational History  . Homemaker    Social History Main  Topics  . Smoking status: Never Smoker   . Smokeless tobacco: Never Used  . Alcohol Use: No  . Drug Use: No  . Sexual Activity: Not on file   Other Topics Concern  . None   Social History Narrative   Usually gets 6-7 hours of sleep per night   4 people living in the home.   Originally from [place] has a bachelor's degree some training in nursing and alternative medicine.   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   Negative tobacco alcohol some caffeine   Gravida 2 para 2 last Pap 2013   Think she had T. 2012    Outpatient Encounter Prescriptions as of 05/28/2014  Medication Sig  . aspirin 81 MG tablet Take 81 mg by mouth daily.  Marland Kitchen enoxaparin (LOVENOX) 40 MG/0.4ML injection Inject 0.4 mLs (40 mg  total) into the skin daily.  . Multiple Vitamin (MULTI-VITAMIN PO) Take by mouth.  . zaleplon (SONATA) 5 MG capsule Take 1 capsule (5 mg total) by mouth at bedtime as needed for sleep.  . [DISCONTINUED] albuterol (PROAIR HFA) 108 (90 BASE) MCG/ACT inhaler Inhale 2 puffs into the lungs every 4 (four) hours as needed for wheezing or shortness of breath.  . [DISCONTINUED] atorvastatin (LIPITOR) 40 MG tablet Take 1 tablet (40 mg total) by mouth daily.  . [DISCONTINUED] FLUoxetine (PROZAC) 10 MG capsule Take 1 capsule (10 mg total) by mouth daily.    EXAM:  BP 108/72 mmHg  Temp(Src) 98.6 F (37 C) (Oral)  Ht  (1.626 m)  Wt 190 lb 4.8 oz (86.32 kg)  BMI 32.65 kg/m2  Body mass index is 32.65 kg/(m^2).  Physical Exam: Vital signs reviewed [id number] is a well-developed well-nourished alert cooperative    who appearsr stated age in no acute distress.  HEENT: normocephalic atraumatic , Eyes: PERRL EOM's full, conjunctiva clear, Nares: paten,t no deformity discharge or tenderness., Ears: no deformity EAC's clear TMs with normal landmarks. Mouth: clear OP, no lesions, edema.  Moist mucous membranes. Dentition in adequate repair. NECK: supple without masses, thyromegaly or bruits. CHEST/PULM:  Clear to auscultation and percussion breath sounds equal no wheeze , rales or rhonchi. No chest wall deformities or tenderness. Breast: normal by inspection . No dimpling, discharge, masses, tenderness or discharge . CV: PMI is nondisplaced, S1 S2 no gallops, murmurs, rubs. Peripheral pulses are full without delay.No JVD .  ABDOMEN: Bowel sounds normal nontender  No guard or rebound, no hepato splenomegal no CVA tenderness.   Extremtities:  No clubbing cyanosis or edema, no acute joint swelling or redness no focal atrophy NEURO:  Oriented x3, cranial nerves 3-12 appear to be intact, no obvious focal weakness,gait within normal limits no abnormal reflexes or asymmetrical SKIN: LEFT LE patches of indurated  purplish rash  nontendern no flaking  Otherwise  normal turgor, color, no bruising or petechiae. PSYCH: Oriented, good eye contact, no obvious depression anxiety, cognition and judgment appear normal. LN: no cervical axillary inguinal adenopathy  Lab Results  Component Value Date   WBC 6.3 05/19/2014   HGB 14.8 05/19/2014   HCT 43.5 05/19/2014   PLT 216.0 05/19/2014   GLUCOSE 88 05/19/2014   CHOL 231* 05/19/2014   TRIG 98.0 05/19/2014   HDL 50.70 05/19/2014   LDLDIRECT 200.7 02/04/2013   LDLCALC 161* 05/19/2014   ALT 39* 05/19/2014   AST 28 05/19/2014  NA 139 05/19/2014   K 4.3 05/19/2014   CL 103 05/19/2014   CREATININE 0.77 05/19/2014   BUN 10 05/19/2014   CO2 29 05/19/2014   TSH 0.94 05/19/2014   HGBA1C 5.8 02/04/2013    ASSESSMENT AND PLAN:  Discussed the following assessment and plan:  Visit for preventive health examination - hcm counseling,BMI counseling  Hyperlipidemia - ran out of lipitor improved but still too high optinos disc rechek in 4 months and fu consider cac for risk assessment with fam hx   Factor V Leiden mutation - reveiwed consult notes with her  i agree with the asa and lovenox as needed   Abnormal LFTs - slight no alarm plan repeat   Itch throat - w/oother sx ? allergy ok to hole the asa and restart trial. dont think related at this time  Skin lesion of left leg - uncertain cause palpable purple get derm opinion - Plan: Ambulatory referral to Dermatology  Adjustment disorder with mixed anxiety and depressed mood - better off med  Insomnia - counseled  avoid reg use of med  use hygien and fu   BMI 31.0-31.9,adult  Need for prophylactic vaccination and inoculation against influenza - Plan: Flu Vaccine QUAD 36+ mos PF IM (Fluarix Quad PF) Extra time over and above wellness exam  More than 50 % counsleing 25 minutes  anticoag , sleep, lipids risk etc  Patient Care Team: Madelin HeadingsWanda K Panosh, MD as PCP - General (Internal Medicine) Levert FeinsteinJames M  Granfortuna, MD as Consulting Physician (Oncology) Sherren Kernsharles E Fields, MD as Consulting Physician (Vascular Surgery) Loney LaurenceMichelle A Horvath, MD as Consulting Physician (Obstetrics and Gynecology) Patient Instructions   Contact us if the itchy throat continues unabated . Can use otc antihistamines  Also. Intensify lifestyle interventions  To help with lipids decrease risk of heart disease  Stroke over time.  Reconsider medication after intervention .  Will arrange a dermatology consult about the skin area  Attend to sleep hygiene  Medication not used on a regular basis . Can use  Sonata for jet labg type help in the short run but all of these meds are dependant producing . Can try melatonin 2-4 hours pre sleep .   Also  exercise  In am mid day helpful   Losing weight may help sleep and  Overall health proscpects .   Can check to see if coronary artery calcium score risk assessment is covered but insurance .   Healthy lifestyle includes : At least 150 minutes of exercise weeks  , weight at healthy levels, which is usually   BMI 19-25. Avoid trans fats and processed foods;  Increase fresh fruits and veges to 5 servings per day. And avoid sweet beverages including tea and juice. Mediterranean diet with olive oil and nuts have been noted to be heart and brain healthy . Avoid tobacco products . Limit  alcohol to  7 per week for women and 14 servings for men.  Get adequate sleep . Wear seat belts . Don't text and drive .            Why follow it? Research shows. . Those who follow the Mediterranean diet have a reduced risk of heart disease  . The diet is associated with a reduced incidence of Parkinson's and Alzheimer's diseases . People following the diet may have longer life expectancies and lower rates of chronic diseases  . The Dietary Guidelines for Americans recommends the Mediterranean diet as an eating plan to promote health and  prevent disease  What Is the Mediterranean Diet?   . Healthy eating plan based on typical foods and recipes of Mediterranean-style cooking . The diet is primarily a plant based diet; these foods should make up a majority of meals   Starches - Plant based foods should make up a majority of meals - They are an important sources of vitamins, minerals, energy, antioxidants, and fiber - Choose whole grains, foods high in fiber and minimally processed items  - Typical grain sources include wheat, oats, barley, corn, brown rice, bulgar, farro, millet, polenta, couscous  - Various types of beans include chickpeas, lentils, fava beans, black beans, white beans   Fruits  Veggies - Large quantities of antioxidant rich fruits & veggies; 6 or more servings  - Vegetables can be eaten raw or lightly drizzled with oil and cooked  - Vegetables common to the traditional Mediterranean Diet include: artichokes, arugula, beets, broccoli, brussel sprouts, cabbage, carrots, celery, collard greens, cucumbers, eggplant, kale, leeks, lemons, lettuce, mushrooms, okra, onions, peas, peppers, potatoes, pumpkin, radishes, rutabaga, shallots, spinach, sweet potatoes, turnips, zucchini - Fruits common to the Mediterranean Diet include: apples, apricots, avocados, cherries, clementines, dates, figs, grapefruits, grapes, melons, nectarines, oranges, peaches, pears, pomegranates, strawberries, tangerines  Fats - Replace butter and margarine with healthy oils, such as olive oil, canola oil, and tahini  - Limit nuts to no more than a handful a day  - Nuts include walnuts, almonds, pecans, pistachios, pine nuts  - Limit or avoid candied, honey roasted or heavily salted nuts - Olives are central to the [employer] - can be eaten whole or used in a variety of dishes   Meats Protein - Limiting red meat: no more than a few times a month - When eating red meat: choose lean cuts and keep the portion to the size of deck of cards - Eggs: approx. 0 to 4 times a week  - Fish and lean  poultry: at least 2 a week  - Healthy protein sources include, chicken, [place], lean beef, lamb - Increase intake of seafood such as tuna, salmon, trout, mackerel, shrimp, scallops - Avoid or limit high fat processed meats such as sausage and bacon  Dairy - Include moderate amounts of low fat dairy products  - Focus on healthy dairy such as fat free yogurt, skim milk, low or reduced fat cheese - Limit dairy products higher in fat such as whole or 2% milk, cheese, ice cream  Alcohol - Moderate amounts of red wine is ok  - No more than 5 oz daily for women (all ages) and men older than age 24  - No more than 10 oz of wine daily for men younger than 33  Other - Limit sweets and other desserts  - Use herbs and spices instead of salt to flavor foods  - Herbs and spices common to the traditional Mediterranean Diet include: basil, bay leaves, chives, cloves, cumin, fennel, garlic, lavender, marjoram, mint, oregano, parsley, pepper, rosemary, sage, savory, sumac, tarragon, thyme   It's not just a diet, it's a lifestyle:  . The Mediterranean diet includes lifestyle factors typical of those in the region  . Foods, drinks and meals are best eaten with others and savored . Daily physical activity is important for overall good health . This could be strenuous exercise like running and aerobics . This could also be more leisurely activities such as walking, housework, yard-work, or taking the stairs . Moderation is the key; a balanced and healthy diet  accommodates most foods and drinks . Consider portion sizes and frequency of consumption of certain foods   Meal Ideas & Options:  . Breakfast:  o Whole wheat toast or whole wheat English muffins with peanut butter & hard boiled egg o Steel cut oats topped with apples & cinnamon and skim milk  o Fresh fruit: banana, strawberries, melon, berries, peaches  o Smoothies: strawberries, bananas, greek yogurt, peanut butter o Low fat greek yogurt with  blueberries and granola  o Egg white omelet with spinach and mushrooms o Breakfast couscous: whole wheat couscous, apricots, skim milk, cranberries  . Sandwiches:  o Hummus and grilled vegetables (peppers, zucchini, squash) on whole wheat bread   o Grilled chicken on whole wheat pita with lettuce, tomatoes, cucumbers or tzatziki  o Tuna salad on whole wheat bread: tuna salad made with greek yogurt, olives, red peppers, capers, green onions o Garlic rosemary lamb pita: lamb sauted with garlic, rosemary, salt & pepper; add lettuce, cucumber, greek yogurt to pita - flavor with lemon juice and black pepper  . Seafood:  o Mediterranean grilled salmon, seasoned with garlic, basil, parsley, lemon juice and black pepper o Shrimp, lemon, and spinach whole-grain pasta salad made with low fat greek yogurt  o Seared scallops with lemon orzo  o Seared tuna steaks seasoned salt, pepper, coriander topped with tomato mixture of olives, tomatoes, olive oil, minced garlic, parsley, green onions and cappers  . Meats:  o Herbed greek chicken salad with kalamata olives, cucumber, feta  o Red bell peppers stuffed with spinach, bulgur, lean ground beef (or lentils) & topped with feta   o Kebabs: skewers of chicken, tomatoes, onions, zucchini, squash  o [place] burgers: made with red onions, mint, dill, lemon juice, feta cheese topped with roasted red peppers . Vegetarian o Cucumber salad: cucumbers, artichoke hearts, celery, red onion, feta cheese, tossed in olive oil & lemon juice  o Hummus and whole grain pita points with a greek salad (lettuce, tomato, feta, olives, cucumbers, red onion) o Lentil soup with celery, carrots made with vegetable broth, garlic, salt and pepper  o Tabouli salad: parsley, bulgur, mint, scallions, cucumbers, tomato, radishes, lemon juice, olive oil, salt and pepper.  Insomnia Insomnia is frequent trouble falling and/or staying asleep. Insomnia can be a long term problem or a short term  problem. Both are common. Insomnia can be a short term problem when the wakefulness is related to a certain stress or worry. Long term insomnia is often related to ongoing stress during waking hours and/or poor sleeping habits. Overtime, sleep deprivation itself can make the problem worse. Every little thing feels more severe because you are overtired and your ability to cope is decreased. CAUSES  Stress, anxiety, and depression. Poor sleeping habits. Distractions such as TV in the bedroom. Naps close to bedtime. Engaging in emotionally charged conversations before bed. Technical reading before sleep. Alcohol and other sedatives. They may make the problem worse. They can hurt normal sleep patterns and normal dream activity. Stimulants such as caffeine for several hours prior to bedtime. Pain syndromes and shortness of breath can cause insomnia. Exercise late at night. Changing time zones may cause sleeping problems (jet lag). It is sometimes helpful to have someone observe your sleeping patterns. They should look for periods of not breathing during the night (sleep apnea). They should also look to see how long those periods last. If you live alone or observers are uncertain, you can also be observed at a sleep clinic where your  sleep patterns will be professionally monitored. Sleep apnea requires a checkup and treatment. Give your caregivers your medical history. Give your caregivers observations your family has made about your sleep.  SYMPTOMS  Not feeling rested in the morning. Anxiety and restlessness at bedtime. Difficulty falling and staying asleep. TREATMENT  Your caregiver may prescribe treatment for an underlying medical disorders. Your caregiver can give advice or help if you are using alcohol or other drugs for self-medication. Treatment of underlying problems will usually eliminate insomnia problems. Medications can be prescribed for short time use. They are generally not recommended  for lengthy use. Over-the-counter sleep medicines are not recommended for lengthy use. They can be habit forming. You can promote easier sleeping by making lifestyle changes such as: Using relaxation techniques that help with breathing and reduce muscle tension. Exercising earlier in the day. Changing your diet and the time of your last meal. No night time snacks. Establish a regular time to go to bed. Counseling can help with stressful problems and worry. Soothing music and white noise may be helpful if there are background noises you cannot remove. Stop tedious detailed work at least one hour before bedtime. HOME CARE INSTRUCTIONS  Keep a diary. Inform your caregiver about your progress. This includes any medication side effects. See your caregiver regularly. Take note of: Times when you are asleep. Times when you are awake during the night. The quality of your sleep. How you feel the next day. This information will help your caregiver care for you. Get out of bed if you are still awake after 15 minutes. Read or do some quiet activity. Keep the lights down. Wait until you feel sleepy and go back to bed. Keep regular sleeping and waking hours. Avoid naps. Exercise regularly. Avoid distractions at bedtime. Distractions include watching television or engaging in any intense or detailed activity like attempting to balance the household checkbook. Develop a bedtime ritual. Keep a familiar routine of bathing, brushing your teeth, climbing into bed at the same time each night, listening to soothing music. Routines increase the success of falling to sleep faster. Use relaxation techniques. This can be using breathing and muscle tension release routines. It can also include visualizing peaceful scenes. You can also help control troubling or intruding thoughts by keeping your mind occupied with boring or repetitive thoughts like the old concept of counting sheep. You can make it more creative like  imagining planting one beautiful flower after another in your backyard garden. During your day, work to eliminate stress. When this is not possible use some of the previous suggestions to help reduce the anxiety that accompanies stressful situations. MAKE SURE YOU:  Understand these instructions. Will watch your condition. Will get help right away if you are not doing well or get worse. Document Released: 03/16/2000 Document Revised: 06/11/2011 Document Reviewed: 04/16/2007 [facility] Patient Information 2015 [place], [place]. This information is not intended to replace advice given to you by your health care provider. Make sure you discuss any questions you have with your health care provider.          Neta Mends. Panosh M.D.  lifetime ascvd risk 39% 8% with optimal risk factors. 10 year risk at 40 0.6 and 0.4 % consider other risk factor assessment such as CAC score

## 2014-05-28 NOTE — Patient Instructions (Addendum)
Contact us if the itchy throat continues unabated . Can use otc antihistamines  Also. Intensify lifestyle interventions  To help with lipids decrease risk of heart disease  Stroke over time.  Reconsider medication after intervention .  Will arrange a dermatology consult about the skin area  Attend to sleep hygiene  Medication not used on a regular basis . Can use  [place]ata for jet labg type help in the short run but all of these meds are dependant producing . Can try melatonin 2-4 hours pre sleep .   Also  exercise  In am mid day helpful   Losing weight may help sleep and  Overall health proscpects .   Can check to see if coronary artery calcium score risk assessment is covered but insurance .   Healthy lifestyle includes : At least 150 minutes of exercise weeks  , weight at healthy levels, which is usually   BMI 19-25. Avoid trans fats and processed foods;  Increase fresh fruits and veges to 5 servings per day. And avoid sweet beverages including tea and juice. Mediterranean diet with olive oil and nuts have been noted to be heart and brain healthy . Avoid tobacco products . Limit  alcohol to  7 per week for women and 14 servings for men.  Get adequate sleep . Wear seat belts . Don't text and drive .            Why follow it? Research shows. . Those who follow the Mediterranean diet have a reduced risk of heart disease  . The diet is associated with a reduced incidence of Parkinson's and Alzheimer's diseases . People following the diet may have longer life expectancies and lower rates of chronic diseases  . The Dietary Guidelines for Americans recommends the Mediterranean diet as an eating plan to promote health and prevent disease  What Is the Mediterranean Diet?  . Healthy eating plan based on typical foods and recipes of Mediterranean-style cooking . The diet is primarily a plant based diet; these foods should make up a majority of meals   Starches - Plant based foods should  make up a majority of meals - They are an important sources of vitamins, minerals, energy, antioxidants, and fiber - Choose whole grains, foods high in fiber and minimally processed items  - Typical grain sources include wheat, oats, barley, corn, brown rice, bulgar, farro, millet, polenta, couscous  - Various types of beans include chickpeas, lentils, fava beans, black beans, white beans   Fruits  Veggies - Large quantities of antioxidant rich fruits & veggies; 6 or more servings  - Vegetables can be eaten raw or lightly drizzled with oil and cooked  - Vegetables common to the traditional Mediterranean Diet include: artichokes, arugula, beets, broccoli, brussel sprouts, cabbage, carrots, celery, collard greens, cucumbers, eggplant, kale, leeks, lemons, lettuce, mushrooms, okra, onions, peas, peppers, potatoes, pumpkin, radishes, rutabaga, shallots, spinach, sweet potatoes, turnips, zucchini - Fruits common to the Mediterranean Diet include: apples, apricots, avocados, cherries, clementines, dates, figs, grapefruits, grapes, melons, nectarines, oranges, peaches, pears, pomegranates, strawberries, tangerines  Fats - Replace butter and margarine with healthy oils, such as olive oil, canola oil, and tahini  - Limit nuts to no more than a handful a day  - Nuts include walnuts, almonds, pecans, pistachios, pine nuts  - Limit or avoid candied, honey roasted or heavily salted nuts - Olives are central to the Mediterranean diet - can be eaten whole or used in a variety of dishes  Meats Protein - Limiting red meat: no more than a few times a month - When eating red meat: choose lean cuts and keep the portion to the size of deck of cards - Eggs: approx. 0 to 4 times a week  - Fish and lean poultry: at least 2 a week  - Healthy protein sources include, chicken, [place], lean beef, lamb - Increase intake of seafood such as tuna, salmon, trout, mackerel, shrimp, scallops - Avoid or limit high fat processed  meats such as sausage and bacon  Dairy - Include moderate amounts of low fat dairy products  - Focus on healthy dairy such as fat free yogurt, skim milk, low or reduced fat cheese - Limit dairy products higher in fat such as whole or 2% milk, cheese, ice cream  Alcohol - Moderate amounts of red wine is ok  - No more than 5 oz daily for women (all ages) and men older than age 40  - No more than 10 oz of wine daily for men younger than 3065  Other - Limit sweets and other desserts  - Use herbs and spices instead of salt to flavor foods  - Herbs and spices common to the traditional Mediterranean Diet include: basil, bay leaves, chives, cloves, cumin, fennel, garlic, lavender, marjoram, mint, oregano, parsley, pepper, rosemary, sage, savory, sumac, tarragon, thyme   It's not just a diet, it's a lifestyle:  . The Mediterranean diet includes lifestyle factors typical of those in the region  . Foods, drinks and meals are best eaten with others and savored . Daily physical activity is important for overall good health . This could be strenuous exercise like running and aerobics . This could also be more leisurely activities such as walking, housework, yard-work, or taking the stairs . Moderation is the key; a balanced and healthy diet accommodates most foods and drinks . Consider portion sizes and frequency of consumption of certain foods   Meal Ideas & Options:  . Breakfast:  o Whole wheat toast or whole wheat English muffins with peanut butter & hard boiled egg o Steel cut oats topped with apples & cinnamon and skim milk  o Fresh fruit: banana, strawberries, melon, berries, peaches  o Smoothies: strawberries, bananas, greek yogurt, peanut butter o Low fat greek yogurt with blueberries and granola  o Egg white omelet with spinach and mushrooms o Breakfast couscous: whole wheat couscous, apricots, skim milk, cranberries  . Sandwiches:  o Hummus and grilled vegetables (peppers, zucchini, squash)  on whole wheat bread   o Grilled chicken on whole wheat pita with lettuce, tomatoes, cucumbers or tzatziki  o Tuna salad on whole wheat bread: tuna salad made with greek yogurt, olives, red peppers, capers, green onions o Garlic rosemary lamb pita: lamb sauted with garlic, rosemary, salt & pepper; add lettuce, cucumber, greek yogurt to pita - flavor with lemon juice and black pepper  . Seafood:  o Mediterranean grilled salmon, seasoned with garlic, basil, parsley, lemon juice and black pepper o Shrimp, lemon, and spinach whole-grain pasta salad made with low fat greek yogurt  o Seared scallops with lemon orzo  o Seared tuna steaks seasoned salt, pepper, coriander topped with tomato mixture of olives, tomatoes, olive oil, minced garlic, parsley, green onions and cappers  . Meats:  o Herbed greek chicken salad with kalamata olives, cucumber, feta  o Red bell peppers stuffed with spinach, bulgur, lean ground beef (or lentils) & topped with feta   o Kebabs: skewers of chicken, tomatoes, onions,  zucchini, squash  o [place] burgers: made with red onions, mint, dill, lemon juice, feta cheese topped with roasted red peppers . Vegetarian o Cucumber salad: cucumbers, artichoke hearts, celery, red onion, feta cheese, tossed in olive oil & lemon juice  o Hummus and whole grain pita points with a greek salad (lettuce, tomato, feta, olives, cucumbers, red onion) o Lentil soup with celery, carrots made with vegetable broth, garlic, salt and pepper  o Tabouli salad: parsley, bulgur, mint, scallions, cucumbers, tomato, radishes, lemon juice, olive oil, salt and pepper.  Insomnia Insomnia is frequent trouble falling and/or staying asleep. Insomnia can be a long term problem or a short term problem. Both are common. Insomnia can be a short term problem when the wakefulness is related to a certain stress or worry. Long term insomnia is often related to ongoing stress during waking hours and/or poor sleeping habits.  Overtime, sleep deprivation itself can make the problem worse. Every little thing feels more severe because you are overtired and your ability to cope is decreased. CAUSES  Stress, anxiety, and depression. Poor sleeping habits. Distractions such as TV in the bedroom. Naps close to bedtime. Engaging in emotionally charged conversations before bed. Technical reading before sleep. Alcohol and other sedatives. They may make the problem worse. They can hurt normal sleep patterns and normal dream activity. Stimulants such as caffeine for several hours prior to bedtime. Pain syndromes and shortness of breath can cause insomnia. Exercise late at night. Changing time zones may cause sleeping problems (jet lag). It is sometimes helpful to have someone observe your sleeping patterns. They should look for periods of not breathing during the night (sleep apnea). They should also look to see how long those periods last. If you live alone or observers are uncertain, you can also be observed at a sleep clinic where your sleep patterns will be professionally monitored. Sleep apnea requires a checkup and treatment. Give your caregivers your medical history. Give your caregivers observations your family has made about your sleep.  SYMPTOMS  Not feeling rested in the morning. Anxiety and restlessness at bedtime. Difficulty falling and staying asleep. TREATMENT  Your caregiver may prescribe treatment for an underlying medical disorders. Your caregiver can give advice or help if you are using alcohol or other drugs for self-medication. Treatment of underlying problems will usually eliminate insomnia problems. Medications can be prescribed for short time use. They are generally not recommended for lengthy use. Over-the-counter sleep medicines are not recommended for lengthy use. They can be habit forming. You can promote easier sleeping by making lifestyle changes such as: Using relaxation techniques that help with  breathing and reduce muscle tension. Exercising earlier in the day. Changing your diet and the time of your last meal. No night time snacks. Establish a regular time to go to bed. Counseling can help with stressful problems and worry. Soothing music and white noise may be helpful if there are background noises you cannot remove. Stop tedious detailed work at least one hour before bedtime. HOME CARE INSTRUCTIONS  Keep a diary. Inform your caregiver about your progress. This includes any medication side effects. See your caregiver regularly. Take note of: Times when you are asleep. Times when you are awake during the night. The quality of your sleep. How you feel the next day. This information will help your caregiver care for you. Get out of bed if you are still awake after 15 minutes. Read or do some quiet activity. Keep the lights down. Wait until you  feel sleepy and go back to bed. Keep regular sleeping and waking hours. Avoid naps. Exercise regularly. Avoid distractions at bedtime. Distractions include watching television or engaging in any intense or detailed activity like attempting to balance the household checkbook. Develop a bedtime ritual. Keep a familiar routine of bathing, brushing your teeth, climbing into bed at the same time each night, listening to soothing music. Routines increase the success of falling to sleep faster. Use relaxation techniques. This can be using breathing and muscle tension release routines. It can also include visualizing peaceful scenes. You can also help control troubling or intruding thoughts by keeping your mind occupied with boring or repetitive thoughts like the old concept of counting sheep. You can make it more creative like imagining planting one beautiful flower after another in your backyard garden. During your day, work to eliminate stress. When this is not possible use some of the previous suggestions to help reduce the anxiety that accompanies  stressful situations. MAKE SURE YOU:  Understand these instructions. Will watch your condition. Will get help right away if you are not doing well or get worse. Document Released: 03/16/2000 Document Revised: 06/11/2011 Document Reviewed: 04/16/2007 [facility] Patient Information 2015 [place], [place]. This information is not intended to replace advice given to you by your health care provider. Make sure you discuss any questions you have with your health care provider.

## 2014-05-30 DIAGNOSIS — L989 Disorder of the skin and subcutaneous tissue, unspecified: Secondary | ICD-10-CM | POA: Insufficient documentation

## 2014-05-30 DIAGNOSIS — G4700 Insomnia, unspecified: Secondary | ICD-10-CM | POA: Insufficient documentation

## 2014-05-30 DIAGNOSIS — R7989 Other specified abnormal findings of blood chemistry: Secondary | ICD-10-CM | POA: Insufficient documentation

## 2014-05-30 DIAGNOSIS — R945 Abnormal results of liver function studies: Secondary | ICD-10-CM | POA: Insufficient documentation

## 2014-06-02 ENCOUNTER — Encounter: Payer: Self-pay | Admitting: Internal Medicine

## 2014-08-02 ENCOUNTER — Telehealth: Payer: Self-pay | Admitting: Oncology

## 2014-08-02 NOTE — Telephone Encounter (Signed)
Call to patient to confirm appointment for 08/03/14 at 3:30 lmtcb

## 2014-08-03 ENCOUNTER — Ambulatory Visit (INDEPENDENT_AMBULATORY_CARE_PROVIDER_SITE_OTHER): Payer: 59 | Admitting: Oncology

## 2014-08-03 ENCOUNTER — Encounter: Payer: Self-pay | Admitting: Oncology

## 2014-08-03 VITALS — BP 113/52 | HR 77 | Temp 98.0°F | Ht 64.0 in | Wt 205.2 lb

## 2014-08-03 DIAGNOSIS — I87009 Postthrombotic syndrome without complications of unspecified extremity: Secondary | ICD-10-CM | POA: Diagnosis not present

## 2014-08-03 DIAGNOSIS — D6851 Activated protein C resistance: Secondary | ICD-10-CM

## 2014-08-03 NOTE — Progress Notes (Signed)
Patient ID: Autumn Cruz, female   DOB: 01-May-1975, 39 y.o.   MRN: [id number] Hematology and Oncology Follow Up Visit  Autumn Cruz [id number] 17-May-1975 39 y.o. 08/03/2014 6:07 PM   Principle Diagnosis: Encounter Diagnoses  Name Primary?  . Factor V Leiden mutation Yes  . Post-phlebitic syndrome      Interim History:  Pleasant 39 year old woman originally from [place] who I saw her for a one-time office consultation 40 years ago in August 2014 when she moved here from [place]. She had a history of a left pelvic DVT at week 24 of her first pregnancy in 2010. She was subsequently found to be a heterozygote for the factor V Leiden gene mutation and a homozygote for the PAI-1 gene. She was fully anticoagulated during her second pregnancy in 2012 and had no complications. She was subsequently put on long-term prophylactic Lovenox 40 mg daily. She had symptoms of a mild, chronic, postphlebitic syndrome with intermittent swelling and discomfort of her left lower extremity. I did not feel that she needed to be on long-term anticoagulation. I did not think that the PAI-1 gene polymorphism contributed in a significant way to her thrombotic events. I recommended that she use low-dose aspirin prophylaxis.  She now presents with a approximate 6 week history of increasing discomfort of her left thigh and calf primarily when she is standing.  Medications: reviewed  Allergies: No Known Allergies  Review of Systems: See HPI Remaining ROS negative:   Physical Exam: Blood pressure 113/52, pulse 77, temperature 98 F (36.7 C), temperature source Oral, height  (1.626 m), weight 205 lb 3.2 oz (93.078 kg), SpO2 100 %. Wt Readings from Last 3 Encounters:  08/03/14 205 lb 3.2 oz (93.078 kg)  05/28/14 190 lb 4.8 oz (86.32 kg)  04/07/13 205 lb (92.987 kg)     General appearance: well nourished woman HENNT: Pharynx no erythema, exudate, mass, or ulcer. No thyromegaly or thyroid nodules Lymph nodes: No  cervical, supraclavicular, or axillary lymphadenopathy Breasts:  Lungs: Clear to auscultation, resonant to percussion throughout Heart: Regular rhythm, no murmur, no gallop, no rub, no click, no edema Abdomen: Soft, nontender, normal bowel sounds, no mass, no organomegaly Extremities: No edema, mild left calf tenderness  Musculoskeletal: no joint deformities Leg measurements: Calf: right 46 cm  Left 47 cm Ankle: right 26 cm   Left 27 cm GU:  Vascular: Carotid pulses 2+, no bruits, distal pulses: Dorsalis pedis 1+ symmetric Neurologic: Alert, oriented, PERRLA, cranial nerves grossly normal, motor strength 5 over 5, reflexes 1+ symmetric, upper body coordination normal, gait normal, Skin: No rash or ecchymosis  Lab Results: CBC W/Diff    Component Value Date/Time   WBC 6.3 05/19/2014 0949   WBC 9.8 11/05/2012 1103   RBC 4.73 05/19/2014 0949   RBC 4.32 11/05/2012 1103   HGB 14.8 05/19/2014 0949   HGB 13.2 11/05/2012 1103   HCT 43.5 05/19/2014 0949   HCT 40.2 11/05/2012 1103   PLT 216.0 05/19/2014 0949   PLT 221 11/05/2012 1103   MCV 92.0 05/19/2014 0949   MCV 93.1 11/05/2012 1103   MCH 30.6 11/05/2012 1103   MCHC 34.0 05/19/2014 0949   MCHC 32.8 11/05/2012 1103   RDW 12.4 05/19/2014 0949   RDW 12.4 11/05/2012 1103   LYMPHSABS 2.8 05/19/2014 0949   LYMPHSABS 2.9 11/05/2012 1103   MONOABS 0.4 05/19/2014 0949   MONOABS 0.5 11/05/2012 1103   EOSABS 0.1 05/19/2014 0949   EOSABS 0.1 11/05/2012 1103   BASOSABS 0.0 05/19/2014 0949  BASOSABS 0.0 11/05/2012 1103     Chemistry      Component Value Date/Time   NA 139 05/19/2014 0949   NA 140 11/05/2012 1104   K 4.3 05/19/2014 0949   K 3.6 11/05/2012 1104   CL 103 05/19/2014 0949   CO2 29 05/19/2014 0949   CO2 26 11/05/2012 1104   BUN 10 05/19/2014 0949   BUN 10.7 11/05/2012 1104   CREATININE 0.77 05/19/2014 0949   CREATININE 0.8 11/05/2012 1104      Component Value Date/Time   CALCIUM 9.9 05/19/2014 0949   CALCIUM 9.4  11/05/2012 1104   ALKPHOS 66 05/19/2014 0949   ALKPHOS 72 11/05/2012 1104   AST 28 05/19/2014 0949   AST 14 11/05/2012 1104   ALT 39* 05/19/2014 0949   ALT 20 11/05/2012 1104   BILITOT 1.3* 05/19/2014 0949   BILITOT 0.87 11/05/2012 1104       Radiological Studies: Venous Doppler study pending   Impression:  Flareup of chronic postphlebitic syndrome. Low clinical suspicion for a new thrombotic event.  Plan: I'm going to check a D-dimer and I will proceed with a venous Doppler study to reassure her that we are not dealing with a new event. Of note, she did have a mild elevation of d-dimer when I checked it 2 years ago(0.69, normal less than 0.48). I will call her when results are available.  CC: Patient Care Team: Madelin HeadingsWanda K Panosh, MD as PCP - General (Internal Medicine) Levert FeinsteinJames M Joscelyn Hardrick, MD as Consulting Physician (Oncology) Sherren Kernsharles E Fields, MD as Consulting Physician (Vascular Surgery) Carrington ClampMichelle Horvath, MD as Consulting Physician (Obstetrics and Gynecology)   Levert FeinsteinJAMES M Inmer Nix, MD 5/3/20166:07 PM

## 2014-08-03 NOTE — Patient Instructions (Signed)
To lab today to check d-dimer Stay on aspirin Doppler study of veins tomorrow at [facility] -  We will call you with results Return visit 1 year or as needed

## 2014-08-04 ENCOUNTER — Other Ambulatory Visit: Payer: Self-pay | Admitting: Oncology

## 2014-08-04 ENCOUNTER — Ambulatory Visit (HOSPITAL_COMMUNITY)
Admission: RE | Admit: 2014-08-04 | Discharge: 2014-08-04 | Disposition: A | Discharge status: Home / Self Care | Payer: 59 | Source: Ambulatory Visit | Attending: Oncology | Admitting: Oncology

## 2014-08-04 DIAGNOSIS — M79605 Pain in left leg: Secondary | ICD-10-CM | POA: Diagnosis present

## 2014-08-04 DIAGNOSIS — I87009 Postthrombotic syndrome without complications of unspecified extremity: Secondary | ICD-10-CM

## 2014-08-04 DIAGNOSIS — D6851 Activated protein C resistance: Secondary | ICD-10-CM | POA: Diagnosis not present

## 2014-08-04 LAB — D-DIMER, QUANTITATIVE: D-Dimer, Quant: 0.39 ug/mL-FEU (ref 0.00–0.48)

## 2014-08-04 NOTE — Progress Notes (Signed)
VASCULAR LAB PRELIMINARY  PRELIMINARY  PRELIMINARY  PRELIMINARY  BLEV completed.    Preliminary report:  Bilateral lower extremity venous duplex done due to presence of partially occluding thrombus in the left common femoral vein.   Prior completely occluding DVT initially documented in 2010 at another facility.  Patient has been on blood thinner until this past year, and she has now been taking a daily aspirin.  Patient states she had series of follow up duplex studies from 2010 to 2013 which showed gradual regression of the DVT that initially extended from the left common femoral to the distal left calf veins.  Partially occluding DVT of the left common femoral and one of two left peroneal veins.  Negative DVT right lower extremity.    Preliminary results discussed with Dr .Cyndie ChimeGranfortuna who also shared that patient's D-Dimer was normal.  Patient instructed to leave for follow up care at the office.  Loralie ChampagneBishop, Remona Boom F, RVT 08/04/2014, 10:04 AM

## 2014-08-23 ENCOUNTER — Telehealth: Payer: Self-pay | Admitting: Internal Medicine

## 2014-08-23 NOTE — Telephone Encounter (Signed)
Patient Name: Autumn PigeonMAYSI Kolasinski DOB: 1976/02/24 Initial Comment Caller states she wants information about blood tests for genetic markers to see if her siblings have same conditions Nurse Assessment Nurse: Yetta BarreJones, RN, Miranda Date/Time (Eastern Time): 08/23/2014 12:35:33 PM Confirm and document reason for call. If symptomatic, describe symptoms. ---Caller states she has Factor V Leiden disorder and her siblings need to be tested while in the country. Has the patient traveled out of the country within the last 30 days? ---Not Applicable Does the patient require triage? ---No Please document clinical information provided and list any resource used. ---Warm [facility] at the office for further assistance. Guidelines Guideline Title Affirmed Question Affirmed Notes Final Disposition User Clinical Call Yetta BarreJones, RN, [employer] IlsleyMiranda

## 2014-08-24 NOTE — Telephone Encounter (Signed)
Spoke to the pt on 08/23/14.  She wanted a diagnosis code to use for the Factor V Leiden disorder to use for her siblings.  Advised that the siblings contact their PCP for testing questions.  Pt notified that Dr. Fabian SharpPanosh is out of the office this week.

## 2014-12-08 ENCOUNTER — Telehealth: Payer: Self-pay | Admitting: *Deleted

## 2014-12-08 ENCOUNTER — Emergency Department (HOSPITAL_COMMUNITY)
Admission: EM | Admit: 2014-12-08 | Discharge: 2014-12-08 | Disposition: A | Discharge status: Home / Self Care | Payer: 59 | Attending: Emergency Medicine | Admitting: Emergency Medicine

## 2014-12-08 ENCOUNTER — Telehealth: Payer: Self-pay | Admitting: Internal Medicine

## 2014-12-08 ENCOUNTER — Encounter (HOSPITAL_COMMUNITY): Payer: Self-pay | Admitting: *Deleted

## 2014-12-08 ENCOUNTER — Emergency Department (EMERGENCY_DEPARTMENT_HOSPITAL)
Admit: 2014-12-08 | Discharge: 2014-12-08 | Disposition: A | Discharge status: Home / Self Care | Payer: 59 | Attending: Emergency Medicine | Admitting: Emergency Medicine

## 2014-12-08 DIAGNOSIS — Z7982 Long term (current) use of aspirin: Secondary | ICD-10-CM | POA: Diagnosis not present

## 2014-12-08 DIAGNOSIS — Z8611 Personal history of tuberculosis: Secondary | ICD-10-CM | POA: Insufficient documentation

## 2014-12-08 DIAGNOSIS — Z79899 Other long term (current) drug therapy: Secondary | ICD-10-CM | POA: Insufficient documentation

## 2014-12-08 DIAGNOSIS — Z8632 Personal history of gestational diabetes: Secondary | ICD-10-CM | POA: Diagnosis not present

## 2014-12-08 DIAGNOSIS — Z86718 Personal history of other venous thrombosis and embolism: Secondary | ICD-10-CM | POA: Insufficient documentation

## 2014-12-08 DIAGNOSIS — M79605 Pain in left leg: Secondary | ICD-10-CM | POA: Diagnosis present

## 2014-12-08 DIAGNOSIS — Z8639 Personal history of other endocrine, nutritional and metabolic disease: Secondary | ICD-10-CM | POA: Diagnosis not present

## 2014-12-08 DIAGNOSIS — M79609 Pain in unspecified limb: Secondary | ICD-10-CM

## 2014-12-08 NOTE — Telephone Encounter (Signed)
Call from [facility] to our clinic. I returned the call but no answer.  Message left to call clinic.

## 2014-12-08 NOTE — Progress Notes (Signed)
*  PRELIMINARY RESULTS* Vascular Ultrasound Left lower extremity venous duplex has been completed.  Preliminary findings: No evidence of DVT. Left CFV was fully evaluated, where previously chronic thrombus was present, but the vein is fully compressible and no internal echoes were noted to suggest thrombus.    Farrel Demark, RDMS, RVT  12/08/2014, 5:55 PM

## 2014-12-08 NOTE — ED Provider Notes (Signed)
CSN: [id number]     Arrival date & time 12/08/14  1453 History   First MD Initiated Contact with Patient 12/08/14 1702     Chief Complaint  Patient presents with  . Leg Pain     (Consider location/radiation/quality/duration/timing/severity/associated sxs/prior Treatment) HPI Comments: Pt comes in generalized left leg pain for the last 3 days. She had recent travel to [place]. She states that she has a history of dvt and she is concerned that this is similar. She is only taking aspirin at this time but she did prophylactic lovenox as told to do when she travels. She states that it feels more swollen  The history is provided by the patient. No language interpreter was used.    Past Medical History  Diagnosis Date  . Gestational diabetes   . High cholesterol   . Positive TB test   . Thrombosis     Left leg preg tulip filter then removed.   Marland Kitchen Hx of thrombosis 2014    Left lower extrm.  Marland Kitchen Post-phlebitic syndrome 2014    Left  LE  . Tuberculosis     Positive test  . DVT (deep venous thrombosis)    Past Surgical History  Procedure Laterality Date  . Cesarean section  2010 & 2012   Family History  Problem Relation Age of Onset  . Heart disease Mother     Blockage/Main artery  . Diabetes Mother   . Coronary artery disease Mother   . Hyperlipidemia Mother   . Hypertension Father   . Deep vein thrombosis Father    Social History  Substance Use Topics  . Smoking status: Never Smoker   . Smokeless tobacco: Never Used  . Alcohol Use: No   OB History    Gravida Para Term Preterm AB TAB SAB Ectopic Multiple Living   2 2             Review of Systems  All other systems reviewed and are negative.     Allergies  Review of patient's allergies indicates no known allergies.  Home Medications   Prior to Admission medications   Medication Sig Start Date End Date Taking? Authorizing Provider  aspirin 81 MG tablet Take 81 mg by mouth daily.    Historical Provider, MD   enoxaparin (LOVENOX) 40 MG/0.4ML injection Inject 0.4 mLs (40 mg total) into the skin daily. 09/22/13   Madelin Headings, MD  Multiple Vitamin (MULTI-VITAMIN PO) Take by mouth.    Historical Provider, MD  zaleplon (SONATA) 5 MG capsule Take 1 capsule (5 mg total) by mouth at bedtime as needed for sleep. 05/28/14   Madelin Headings, MD   BP 140/60 mmHg  Pulse 93  Temp(Src) 98.3 F (36.8 C)  Resp 16  Ht  (1.651 m)  Wt 210 lb 12.8 oz (95.618 kg)  BMI 35.08 kg/m2  SpO2 100% Physical Exam  Constitutional: She is oriented to person, place, and time. She appears well-developed and well-nourished.  Cardiovascular: Normal rate and regular rhythm.   Pulmonary/Chest: Effort normal and breath sounds normal.  Musculoskeletal: Normal range of motion. She exhibits no edema.  Pulses intact.generalized tenderness to the left leg.  Neurological: She is alert and oriented to person, place, and time. Coordination normal.  Skin: Skin is warm and dry.  Nursing note and vitals reviewed.   ED Course  Procedures (including critical care time) Labs Review Labs Reviewed - No data to display  Imaging Review No results found. I have personally  reviewed and evaluated these images and lab results as part of my medical decision-making.   EKG Interpretation None      MDM   Final diagnoses:  Left leg pain    No dvt noted on [place]. Discussed symptomatic treatment at home. Likely musculoskeletal in nature    Teressa Lower, NP 12/08/14 1846  Marily Memos, MD 12/09/14 0100

## 2014-12-08 NOTE — Telephone Encounter (Signed)
Pt calls and states for 2 days she has weakness and numbness on the  L side of her body, she states her L leg, which she had a DVT in before feels strange and hard to move. She is advised to have someone take her straight to ED or call 911, she is agreeable

## 2014-12-08 NOTE — Telephone Encounter (Signed)
Pt have left leg pain, stiff and numb for two days. Please call pt back.

## 2014-12-08 NOTE — Discharge Instructions (Signed)

## 2014-12-08 NOTE — ED Notes (Signed)
Pt reports left leg pain and swelling pt has hx of blood clots in legs. Pt reports recent travel.

## 2014-12-08 NOTE — Telephone Encounter (Signed)
Agree. Thanks

## 2015-01-19 ENCOUNTER — Telehealth: Payer: Self-pay | Admitting: Internal Medicine

## 2015-01-19 ENCOUNTER — Other Ambulatory Visit: Payer: Self-pay | Admitting: Internal Medicine

## 2015-01-19 MED ORDER — ENOXAPARIN SODIUM 40 MG/0.4ML ~~LOC~~ SOLN: 40.0000 mg | SUBCUTANEOUS | Status: DC

## 2015-01-19 NOTE — Telephone Encounter (Signed)
Sent to the pharmacy by e-scribe. 

## 2015-01-19 NOTE — Telephone Encounter (Signed)
Pt request refill lenoxaparin (LOVENOX) 40 MG/0.4ML injection  [place] is [place] and would like 2 mo supply  [facility]/ [address]

## 2015-01-19 NOTE — Telephone Encounter (Signed)
Ok to rx  40 mg per day injec for 2 months

## 2015-06-10 ENCOUNTER — Other Ambulatory Visit: Payer: Self-pay | Admitting: Obstetrics and Gynecology

## 2015-06-10 DIAGNOSIS — R928 Other abnormal and inconclusive findings on diagnostic imaging of breast: Secondary | ICD-10-CM

## 2015-06-17 ENCOUNTER — Ambulatory Visit
Admission: RE | Admit: 2015-06-17 | Discharge: 2015-06-17 | Disposition: A | Discharge status: Home / Self Care | Payer: 59 | Source: Ambulatory Visit | Attending: Obstetrics and Gynecology | Admitting: Obstetrics and Gynecology

## 2015-06-17 DIAGNOSIS — R928 Other abnormal and inconclusive findings on diagnostic imaging of breast: Secondary | ICD-10-CM

## 2015-07-11 ENCOUNTER — Encounter: Payer: Self-pay | Admitting: Family Medicine

## 2015-07-11 ENCOUNTER — Ambulatory Visit (INDEPENDENT_AMBULATORY_CARE_PROVIDER_SITE_OTHER): Payer: 59 | Admitting: Family Medicine

## 2015-07-11 VITALS — BP 120/90 | HR 88 | Temp 98.6°F | Ht 65.0 in | Wt 207.0 lb

## 2015-07-11 DIAGNOSIS — R059 Cough, unspecified: Secondary | ICD-10-CM

## 2015-07-11 DIAGNOSIS — K219 Gastro-esophageal reflux disease without esophagitis: Secondary | ICD-10-CM | POA: Diagnosis not present

## 2015-07-11 DIAGNOSIS — M25562 Pain in left knee: Secondary | ICD-10-CM | POA: Diagnosis not present

## 2015-07-11 DIAGNOSIS — J309 Allergic rhinitis, unspecified: Secondary | ICD-10-CM | POA: Diagnosis not present

## 2015-07-11 DIAGNOSIS — R05 Cough: Secondary | ICD-10-CM | POA: Diagnosis not present

## 2015-07-11 MED ORDER — OMEPRAZOLE 20 MG PO CPDR: 20.0000 mg | DELAYED_RELEASE_CAPSULE | Freq: Two times a day (BID) | ORAL | Status: DC

## 2015-07-11 MED ORDER — BENZONATATE 100 MG PO CAPS: 100.0000 mg | ORAL_CAPSULE | Freq: Three times a day (TID) | ORAL | Status: DC | PRN

## 2015-07-11 MED ORDER — FLUTICASONE PROPIONATE 50 MCG/ACT NA SUSP: 2.0000 | Freq: Every day | NASAL | Status: DC

## 2015-07-11 NOTE — Progress Notes (Signed)
Pre visit review using our clinic review tool, if applicable. No additional management support is needed unless otherwise documented below in the visit note. 

## 2015-07-11 NOTE — Patient Instructions (Addendum)
Cough can be caused by different problems: infections, after viral illness, stomach problems, allergies among some.  OTC Allegra 180 mg daily recommended.    Food Choices for Gastroesophageal Reflux Disease, Adult When you have gastroesophageal reflux disease (GERD), the foods you eat and your eating habits are very important. Choosing the right foods can help ease the discomfort of GERD. WHAT GENERAL GUIDELINES DO I NEED TO FOLLOW?  Choose fruits, vegetables, whole grains, low-fat dairy products, and low-fat meat, fish, and poultry.  Limit fats such as oils, salad dressings, butter, nuts, and avocado.  Keep a food diary to identify foods that cause symptoms.  Avoid foods that cause reflux. These may be different for different people.  Eat frequent small meals instead of three large meals each day.  Eat your meals slowly, in a relaxed setting.  Limit fried foods.  Cook foods using methods other than frying.  Avoid drinking alcohol.  Avoid drinking large amounts of liquids with your meals.  Avoid bending over or lying down until 2-3 hours after eating. WHAT FOODS ARE NOT RECOMMENDED? The following are some foods and drinks that may worsen your symptoms: Vegetables Tomatoes. Tomato juice. Tomato and spaghetti sauce. Chili peppers. Onion and garlic. Horseradish. Fruits Oranges, grapefruit, and lemon (fruit and juice). Meats High-fat meats, fish, and poultry. This includes hot dogs, ribs, ham, sausage, salami, and bacon. Dairy Whole milk and chocolate milk. Sour cream. Cream. Butter. Ice cream. Cream cheese.  Beverages Coffee and tea, with or without caffeine. Carbonated beverages or energy drinks. Condiments Hot sauce. Barbecue sauce.  Sweets/Desserts Chocolate and cocoa. Donuts. Peppermint and spearmint. Fats and Oils High-fat foods, including [place] fries and potato chips. Other Vinegar. Strong spices, such as black pepper, white pepper, red pepper, cayenne, curry  powder, cloves, ginger, and chili powder. The items listed above may not be a complete list of foods and beverages to avoid. Contact your dietitian for more information.   This information is not intended to replace advice given to you by your health care provider. Make sure you discuss any questions you have with your health care provider.   Document Released: 03/19/2005 Document Revised: 04/09/2014 Document Reviewed: 01/21/2013 Elsevier Interactive Patient Education 2016 Elsevier Inc.  Knee pain I think it is musculoskeletal, no DVT.Still if worsening or erythema + edema or shortness of breath or chest pain please seek medical attentions.

## 2015-07-11 NOTE — Progress Notes (Addendum)
Subjective:    Patient ID: Autumn Cruz, female    DOB: March 30, 1976, 40 y.o.   MRN: [id number]  HPI  Autumn Cruz is a 40 y.o.female here today complaining of 10-14 days of respiratory symptoms: non productive cough, rhinorrhea, and nasal congestion. + Throat itching. + Wheezing, mainly at night when lying down.No hx of asthma. No fever, chills, myalgias, odynophagia, abdominal pain, nausea, or vomiting.  No Hx of recent travel. No sick contact. No known insect bite. Denies any Hx of allergies.  + Occasional heartburn, mainly when she eats spicy food; which she has not done lately.  She has tried OTC Robitussin. Symptoms otherwise stable.  Left knee pain:  She also mentions 2 days of left knee pain, she has hx of knee arthralgia but she feels like this pain is different.She is concerned about DVT because she has prior Hx on LLE and V Laiden factor deficiency. No on OCP's, no prolonged sitting or travel, no recent surgery, no changes in baseline LE edema, no erythema, no palpitations,dyspnea, or chest pain. She is on Lovenox as needed, she uses it when long travelling. Pain is described as soreness, "not bad" 3/10, intermittent, exacerbated by going down stairs and prolonged walking. Relieved by rest. No Hx of trauma, no erythema. + Varicose veins on area. No limitation of ROM.    Review of Systems  Constitutional: Positive for fatigue (no more than usual). Negative for fever, activity change, appetite change and unexpected weight change.  HENT: Positive for congestion, postnasal drip and rhinorrhea. Negative for ear pain, mouth sores, sinus pressure, sneezing, sore throat, trouble swallowing and voice change.   Eyes: Negative for discharge, redness and itching.  Respiratory: Positive for cough and wheezing. Negative for chest tightness, shortness of breath and stridor.   Cardiovascular: Positive for leg swelling (at baseline). Negative for chest pain and palpitations.    Gastrointestinal: Negative for nausea, vomiting, abdominal pain and diarrhea.       Occasional heartburn, associated with certain food  Musculoskeletal: Positive for arthralgias (left knee). Negative for myalgias, back pain, joint swelling and neck pain.  Skin: Negative for rash.  Allergic/Immunologic: Negative for environmental allergies.  Neurological: Negative for weakness, numbness and headaches.  Hematological: Negative for adenopathy. Does not bruise/bleed easily.   Current Outpatient Prescriptions on File Prior to Visit  Medication Sig Dispense Refill  . aspirin 81 MG tablet Take 81 mg by mouth daily.    . Multiple Vitamin (MULTI-VITAMIN PO) Take by mouth.    . enoxaparin (LOVENOX) 40 MG/0.4ML injection INJECT 0.4 ML (1 SYRINGE) INTO THE SKIN DAILY (Patient not taking: Reported on 07/11/2015) 30 Syringe 0   No current facility-administered medications on file prior to visit.     Past Medical History  Diagnosis Date  . Gestational diabetes   . High cholesterol   . Positive TB test   . Thrombosis     Left leg preg tulip filter then removed.   Marland Kitchen Hx of thrombosis 2014    Left lower extrm.  Marland Kitchen Post-phlebitic syndrome 2014    Left  LE  . Tuberculosis     Positive test  . DVT (deep venous thrombosis) (HCC)     Social History   Social History  . Marital Status: Married    Spouse Name: [name]  . Number of Children: 2  . Years of Education: N/A   Occupational History  . Homemaker    Social History Main Topics  . Smoking status: Never Smoker   .  Smokeless tobacco: Never Used  . Alcohol Use: No  . Drug Use: No  . Sexual Activity: Not Asked   Other Topics Concern  . None   Social History Narrative   Usually gets 6-7 hours of sleep per night   4 people living in the home.   Originally from [place] has a bachelor's degree some training in nursing and alternative medicine.   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   Negative tobacco alcohol some caffeine   Gravida 2 para 2 last Pap 2013   Think she had T. 2012    Filed Vitals:   07/11/15 1306  BP: 120/90  Pulse: 88  Temp: 98.6 F (37 C)   Body mass index is 34.45 kg/(m^2).   SpO2 Readings from Last 3 Encounters:  07/11/15 97%  12/08/14 98%  08/03/14 100%      Objective:   Physical Exam  Constitutional: She is oriented to person, place, and time. She appears well-developed and well-nourished.  HENT:  Head: Normocephalic and atraumatic.  Right Ear: Tympanic membrane, external ear and ear canal normal.  Left Ear: Tympanic membrane, external ear and ear canal normal.  Nose: Rhinorrhea present. No mucosal edema. Right sinus exhibits no maxillary sinus tenderness and no frontal sinus tenderness. Left sinus exhibits no maxillary sinus tenderness and no frontal sinus tenderness.  Mouth/Throat: Uvula is midline and mucous membranes are normal. No oropharyngeal exudate, posterior oropharyngeal edema or posterior oropharyngeal erythema.  Hypertrophic turbinates.Nasal voice.  Eyes: Conjunctivae and EOM are normal.  Cardiovascular: Normal rate and regular rhythm.   No murmur heard. Pulses:      Dorsalis pedis pulses are 2+ on the right side, and 2+ on the left side.  Varicose veins on LE bilateral. Holman's test negative bilateral.  Pulmonary/Chest: Effort normal and breath sounds normal. No respiratory distress. She has no wheezes. She has no rales.  Non productive cough spells a few times during OV  Abdominal: Soft. There is no tenderness.  Musculoskeletal: She exhibits no edema or tenderness.  Knee: on inspection no effusion, erythema, or deformities. Valgus and varus stress normal, McMurray negative, anterior and posterior drawer test negative. Patellar apprehension test negative. Normal ROM, full flexion elicits medial knee pain.    Lymphadenopathy:    She has no cervical  adenopathy.  Neurological: She is alert and oriented to person, place, and time.  Skin: Skin is warm. No rash noted.  Psychiatric: She has a normal mood and affect.  Well groomed and good eye contact.  Nursing note and vitals reviewed.   BP re-checked 110/70.     Assessment & Plan:    Shiryl was seen today for cough.  Diagnoses and all orders for this visit:  Cough We discussed possible causes, including allergies,GERD,infectious among some. In her case it can be a combination of allergies and GERD.  Lung auscultation and examination today does not suggest a concerning process. CXR I do not think is needed but still was offered.She does not feel like imaging is needed either.  -     benzonatate (TESSALON) 100 MG capsule; Take 1 capsule (100 mg total) by mouth 3 (three) times daily as needed for cough.  Allergic rhinitis, unspecified allergic rhinitis type OTC antihistaminic recommended. Flonase nasal spray daily and nasal saline drops + steam inhalations may also help.   -     fluticasone (FLONASE)  50 MCG/ACT nasal spray; Place 2 sprays into both nostrils daily.  Gastroesophageal reflux disease without esophagitis After discussion of PPI side effects she agrees with trying a short course, bid. GERD precautions discussed.  -     omeprazole (PRILOSEC) 20 MG capsule; Take 1 capsule (20 mg total) by mouth 2 (two) times daily before a meal.  Left medial knee pain ? OA,sprain. Physical exam and hx are not suggestive of DVT. Still probability is never 0% given her risk factors; so she must seek medical evaluation if dyspnea, tachycardia, chest pain, LE worsening edema or erythema; all which she states she is familiar with, so she understands. She does not feel like medication for pain is needed, topical OTC meds may help. F/U as needed.     Betty G. [place], MD  [facility]. [place] office.

## 2015-07-18 ENCOUNTER — Ambulatory Visit (INDEPENDENT_AMBULATORY_CARE_PROVIDER_SITE_OTHER): Payer: 59 | Admitting: Oncology

## 2015-07-18 ENCOUNTER — Encounter: Payer: Self-pay | Admitting: Oncology

## 2015-07-18 VITALS — BP 131/64 | HR 84 | Temp 98.6°F | Ht 64.0 in | Wt 207.2 lb

## 2015-07-18 DIAGNOSIS — D6851 Activated protein C resistance: Secondary | ICD-10-CM | POA: Diagnosis not present

## 2015-07-18 DIAGNOSIS — D6859 Other primary thrombophilia: Secondary | ICD-10-CM

## 2015-07-18 DIAGNOSIS — I87009 Postthrombotic syndrome without complications of unspecified extremity: Secondary | ICD-10-CM | POA: Diagnosis not present

## 2015-07-18 DIAGNOSIS — Z86718 Personal history of other venous thrombosis and embolism: Secondary | ICD-10-CM

## 2015-07-18 DIAGNOSIS — D689 Coagulation defect, unspecified: Secondary | ICD-10-CM

## 2015-07-18 DIAGNOSIS — Z7901 Long term (current) use of anticoagulants: Secondary | ICD-10-CM

## 2015-07-18 NOTE — Progress Notes (Signed)
Patient ID: Autumn Cruz, female   DOB: 1976/01/28, 40 y.o.   MRN: [id number] Hematology and Oncology Follow Up Visit  Annaliza Carlberg [id number] 1976/01/28 39 y.o. 07/18/2015 5:35 PM   Principle Diagnosis: Encounter Diagnoses  Name Primary?  . Factor V Leiden mutation (HCC) Yes  . Post-phlebitic syndrome   . Hypercoagulable state (HCC)   . Hx of thrombosis of lower extremity in pregnancy   . Long term (current) use of anticoagulants   Clinical Summary: Pleasant 40 year old woman originally from [place] who I saw her for a one-time office consultation 3 years ago in August 2014 when [place] moved here from [place]. She had a history of a left pelvic DVT at week 24 of her first pregnancy in 2010. She was subsequently found to be a heterozygote for the factor V Leiden gene mutation and a homozygote for the PAI-1 gene. She was fully anticoagulated during her second pregnancy in 2012 and had no complications. She was subsequently put on long-term prophylactic Lovenox 40 mg daily. She had symptoms of a mild, chronic, postphlebitic syndrome with intermittent swelling and discomfort of her left lower extremity. I did not feel that she needed to be on long-term anticoagulation. I did not think that the PAI-1 gene polymorphism contributed in a significant way to her thrombotic events. I recommended that she use low-dose aspirin prophylaxis.  I saw her for an unscheduled visit on 08/03/2014 with an approximate 6 week history of increasing discomfort of her left thigh and calf primarily when she is standing. She had mild left calf tenderness on exam but no significant discrepancy in the circumference of the calf or ankle compare with the right side. D-dimer in normal range at 0.39. Vascular ultrasound of the lower extremity did not show any recurrent clot. Findings consistent with a mild postphlebitic syndrome. Symptoms resolved on their own.   Interim History:   She has had no interim problems since the visit 1 year  ago. Her children are doing well now aged 5 and 7. She occasionally gets discomfort in her left calf when she stands for a long time. She denies any dyspnea, chest pain, or palpitations. She remains on low-dose aspirin. She does not plan on having anymore children. 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 She will be visiting there for 2 months in May. She will use prophylactic Lovenox to cover herself for the flight.  Medications: reviewed  Allergies: No Known Allergies  Review of Systems: See interim history Remaining ROS negative:   Physical Exam: Blood pressure 131/64, pulse 84, temperature 98.6 F (37 C), temperature source Oral, height 5\' 4"  (1.626 m), weight 207 lb 3.2 oz (93.985 kg), last menstrual period 06/10/2015, SpO2 99 %. Wt Readings from Last 3 Encounters:  07/18/15 207 lb 3.2 oz (93.985 kg)  07/11/15 207 lb (93.895 kg)  12/08/14 210 lb 12.8 oz (95.618 kg)     General appearance: Well-nourished woman HENNT: Pharynx no erythema, exudate, mass, or ulcer. No thyromegaly or thyroid nodules Lymph nodes: No cervical, supraclavicular, or axillary lymphadenopathy Breasts:  Lungs: Clear to auscultation, resonant to percussion throughout Heart: Regular rhythm, no murmur, no gallop, no rub, no click, no edema Abdomen: Soft, nontender, normal bowel sounds, no mass, no organomegaly Extremities: No edema, no calf tenderness Measurements: Right calf 45 cm, left 45.5 Right ankle 31 cm, left 27 Musculoskeletal: no joint deformities GU:  Vascular: Carotid pulses 2+, no bruits, distal pulses: Dorsalis pedis  1+ symmetric Neurologic: Alert, oriented, PERRLA,  exudate, cranial nerves grossly normal, motor strength 5 over 5, reflexes 1+ symmetric, upper body coordination normal, gait normal, Skin: Atypical, raised, nodular, brownish colored rash, over about a 15 cm area lateral posterior aspect left ankle, no  ecchymosis.  Lab Results: CBC W/Diff    Component Value Date/Time   WBC 6.3 05/19/2014 0949   WBC 9.8 11/05/2012 1103   RBC 4.73 05/19/2014 0949   RBC 4.32 11/05/2012 1103   HGB 14.8 05/19/2014 0949   HGB 13.2 11/05/2012 1103   HCT 43.5 05/19/2014 0949   HCT 40.2 11/05/2012 1103   PLT 216.0 05/19/2014 0949   PLT 221 11/05/2012 1103   MCV 92.0 05/19/2014 0949   MCV 93.1 11/05/2012 1103   MCH 30.6 11/05/2012 1103   MCHC 34.0 05/19/2014 0949   MCHC 32.8 11/05/2012 1103   RDW 12.4 05/19/2014 0949   RDW 12.4 11/05/2012 1103   LYMPHSABS 2.8 05/19/2014 0949   LYMPHSABS 2.9 11/05/2012 1103   MONOABS 0.4 05/19/2014 0949   MONOABS 0.5 11/05/2012 1103   EOSABS 0.1 05/19/2014 0949   EOSABS 0.1 11/05/2012 1103   BASOSABS 0.0 05/19/2014 0949   BASOSABS 0.0 11/05/2012 1103     Chemistry      Component Value Date/Time   NA 139 05/19/2014 0949   NA 140 11/05/2012 1104   K 4.3 05/19/2014 0949   K 3.6 11/05/2012 1104   CL 103 05/19/2014 0949   CO2 29 05/19/2014 0949   CO2 26 11/05/2012 1104   BUN 10 05/19/2014 0949   BUN 10.7 11/05/2012 1104   CREATININE 0.77 05/19/2014 0949   CREATININE 0.8 11/05/2012 1104      Component Value Date/Time   CALCIUM 9.9 05/19/2014 0949   CALCIUM 9.4 11/05/2012 1104   ALKPHOS 66 05/19/2014 0949   ALKPHOS 72 11/05/2012 1104   AST 28 05/19/2014 0949   AST 14 11/05/2012 1104   ALT 39* 05/19/2014 0949   ALT 20 11/05/2012 1104   BILITOT 1.3* 05/19/2014 0949   BILITOT 0.87 11/05/2012 1104       Radiological Studies: No results found.  Impression:  #1. Coagulopathy secondary to factor V Leiden heterozygote status with possible some contribution from PAI-1 mutation.  #2. Proximal pelvic thrombosis during first pregnancy secondary to #1. Likely a May Thurner syndrome.  #3. Mild, intermittent, post phlebitis syndrome secondary to #2. Currently stable with no active phlebitis.  I will see her again in one year. She will call for any interim  problems or if she has any elective surgery scheduled and needs advice about anticoagulation. I agree with taking prophylactic Lovenox cover a long trip to [place] next month.   CC: Patient Care Team: Madelin Headings, MD as PCP - General (Internal Medicine) Levert Feinstein, MD as Consulting Physician (Oncology) Sherren Kerns, MD as Consulting Physician (Vascular Surgery) Carrington Clamp, MD as Consulting Physician (Obstetrics and Gynecology)   Levert Feinstein, MD 4/17/20175:35 PM

## 2015-07-18 NOTE — Patient Instructions (Signed)
Return visit 1 year Call for any interim problems or if you are planning any surgery

## 2015-07-27 ENCOUNTER — Telehealth: Payer: Self-pay | Admitting: Internal Medicine

## 2015-07-27 DIAGNOSIS — R05 Cough: Secondary | ICD-10-CM

## 2015-07-27 DIAGNOSIS — R059 Cough, unspecified: Secondary | ICD-10-CM

## 2015-07-27 NOTE — Telephone Encounter (Signed)
Pt saw dr Swazilandjordan on 07-11-15 for cough. Pt would like to proceed with getting chest xray

## 2015-07-28 NOTE — Telephone Encounter (Signed)
Spoke with pt and she is aware.

## 2015-07-28 NOTE — Telephone Encounter (Signed)
Order [facility] been place [facility]

## 2015-07-28 NOTE — Telephone Encounter (Signed)
It is Ok to have CXR done. Thanks BJ

## 2015-07-28 NOTE — Telephone Encounter (Signed)
Please advise 

## 2015-08-01 ENCOUNTER — Ambulatory Visit (INDEPENDENT_AMBULATORY_CARE_PROVIDER_SITE_OTHER)
Admission: RE | Admit: 2015-08-01 | Discharge: 2015-08-01 | Disposition: A | Discharge status: Home / Self Care | Payer: 59 | Source: Ambulatory Visit | Attending: Family Medicine | Admitting: Family Medicine

## 2015-08-01 DIAGNOSIS — R05 Cough: Secondary | ICD-10-CM | POA: Diagnosis not present

## 2015-08-01 DIAGNOSIS — R059 Cough, unspecified: Secondary | ICD-10-CM

## 2015-08-02 ENCOUNTER — Other Ambulatory Visit: Payer: Self-pay | Admitting: Family Medicine

## 2015-08-02 MED ORDER — BENZONATATE 100 MG PO CAPS: ORAL_CAPSULE | ORAL | Status: AC

## 2015-08-02 NOTE — Progress Notes (Signed)
Benzonatate sent to pharmacy as requested by pt.

## 2015-08-05 ENCOUNTER — Ambulatory Visit: Payer: 59 | Admitting: Internal Medicine

## 2015-08-10 ENCOUNTER — Ambulatory Visit: Payer: 59 | Admitting: Internal Medicine

## 2015-08-15 ENCOUNTER — Ambulatory Visit (INDEPENDENT_AMBULATORY_CARE_PROVIDER_SITE_OTHER): Payer: 59 | Admitting: Internal Medicine

## 2015-08-15 ENCOUNTER — Encounter: Payer: Self-pay | Admitting: Internal Medicine

## 2015-08-15 VITALS — BP 120/80 | HR 80 | Temp 98.6°F | Ht 65.0 in | Wt 207.0 lb

## 2015-08-15 DIAGNOSIS — R05 Cough: Secondary | ICD-10-CM | POA: Diagnosis not present

## 2015-08-15 DIAGNOSIS — J392 Other diseases of pharynx: Secondary | ICD-10-CM

## 2015-08-15 DIAGNOSIS — Z79899 Other long term (current) drug therapy: Secondary | ICD-10-CM

## 2015-08-15 DIAGNOSIS — R059 Cough, unspecified: Secondary | ICD-10-CM

## 2015-08-15 MED ORDER — BENZONATATE 100 MG PO CAPS: 100.0000 mg | ORAL_CAPSULE | Freq: Three times a day (TID) | ORAL | Status: AC | PRN

## 2015-08-15 NOTE — Progress Notes (Signed)
Chief Complaint  Patient presents with  . Throat irritation >1 mo    HPI: Autumn Cruz 40 y.o. with  Factor V Leiden mutation (HCC)     Post-phlebitic syndrome   . Hypercoagulable state (HCC)   . Hx of thrombosis of lower extremity in pregnancy   . Long term (current) use of anticoagulants         Now C/O of rritative cough and itchy  On going  In   Throat  See last notes dr [place]  And had x ray and given  Cough med  And bothers her in am and night   ? If chocolate or  Hot powder. ?  no cp sob   Minimal upper sx   Seems to be after a couding illness  No fever not sick  ROS: See pertinent positives and negatives per HPI.  Past Medical History  Diagnosis Date  . Gestational diabetes   . High cholesterol   . Positive TB test   . Thrombosis     Left leg preg tulip filter then removed.   Marland Kitchen Hx of thrombosis 2014    Left lower extrm.  Marland Kitchen Post-phlebitic syndrome 2014    Left  LE  . Tuberculosis     Positive test  . DVT (deep venous thrombosis) (HCC)     Family History  Problem Relation Age of Onset  . Heart disease Mother     Blockage/Main artery  . Diabetes Mother   . Coronary artery disease Mother   . Hyperlipidemia Mother   . Hypertension Father   . Deep vein thrombosis Father     Social History   Social History  . Marital Status: Married    Spouse Name: [name]  . Number of Children: 2  . Years of Education: N/A   Occupational History  . Homemaker    Social History Main Topics  . Smoking status: Never Smoker   . Smokeless tobacco: Never Used  . Alcohol Use: No  . Drug Use: No  . Sexual Activity: Not Asked   Other Topics Concern  . None   Social History Narrative   Usually gets 6-7 hours of sleep per night   4 people living in the home.   Originally from [place] has a bachelor's degree some training in nursing and alternative medicine.   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   Negative tobacco alcohol some caffeine   Gravida 2 para 2 last Pap 2013   Think she had T. 2012    Outpatient Prescriptions Prior to Visit  Medication Sig Dispense Refill  . aspirin 81 MG tablet Take 81 mg by mouth daily.    Marland Kitchen enoxaparin (LOVENOX) 40 MG/0.4ML injection INJECT 0.4 ML (1 SYRINGE) INTO THE SKIN DAILY 30 Syringe 0  . fluticasone (FLONASE) 50 MCG/ACT nasal spray Place 2 sprays into both nostrils daily. 16 g 2  . Multiple Vitamin (MULTI-VITAMIN PO) Take by mouth.    Marland Kitchen omeprazole (PRILOSEC) 20 MG capsule Take 1 capsule (20 mg total) by mouth 2 (two) times daily before a meal. 60 capsule 0   No facility-administered medications prior to visit.     EXAM:  BP 120/80 mmHg  Pulse 80  Temp(Src) 98.6 F (37 C) (Oral)  Ht  (1.651 m)  Wt 207 lb (93.895 kg)  BMI 34.45 kg/m2  SpO2 98%  LMP 06/08/2015  Body mass index is 34.45 kg/(m^2).  GENERAL: vitals reviewed and listed above, alert, oriented, appears well hydrated and in no acute distress HEENT: atraumatic, conjunctiva  clear, no obvious abnormalities on inspection of external nose and ears OP : no lesion edema or exudate some throat clearing  No stridor  NECK: no obvious masses on inspection palpation  LUNGS: clear to auscultation bilaterally, no wheezes, rales or rhonchi, good air movement CV: HRRR, no clubbing cyanosis or  peripheral edema nl cap refill  MS: moves all extremities without noticeable focal  abnormality PSYCH: pleasant and cooperative, no obvious depression or anxiety   ASSESSMENT AND PLAN:  Discussed the following assessment and plan:  Cough - pos post inf cough or upper airway irritation vc allergic gerd sx  Throat irritation  Medication management  -Patient advised to return or notify health care team  if symptoms worsen ,persist or new concerns arise.  Patient Instructions  Stay on the omprazole for another 4 weeks   When well  ,will plan on weaning  also the Flonase   Add antihistamine  Such as allegra generic every day  . ( others such as chlorpheniramine may work better but cause drowsiness)   If not better in another  3-4 weeks then consider seeing  The pulmonary docs  This acts like cough poss  Airway irritabilty  Syndrome ,    Silent reflux and or allergic cough.     Neta MendsWanda K. Cederic Mozley M.D.

## 2015-08-15 NOTE — Patient Instructions (Addendum)
Stay on the omprazole for another 4 weeks   When well ,will plan on weaning  also the Flonase   Add antihistamine  Such as allegra generic every day  . ( others such as chlorpheniramine may work better but cause drowsiness)   If not better in another  3-4 weeks then consider seeing  The pulmonary docs  This acts like cough poss  Airway irritabilty  Syndrome ,    Silent reflux and or allergic cough.

## 2015-09-20 ENCOUNTER — Ambulatory Visit: Payer: 59 | Admitting: Oncology

## 2015-12-20 ENCOUNTER — Other Ambulatory Visit (INDEPENDENT_AMBULATORY_CARE_PROVIDER_SITE_OTHER): Payer: 59

## 2015-12-20 DIAGNOSIS — Z0000 Encounter for general adult medical examination without abnormal findings: Secondary | ICD-10-CM | POA: Diagnosis not present

## 2015-12-20 LAB — CBC WITH DIFFERENTIAL/PLATELET
BASOS ABS: 0 10*3/uL (ref 0.0–0.1)
BASOS PCT: 0.5 % (ref 0.0–3.0)
EOS PCT: 1.6 % (ref 0.0–5.0)
Eosinophils Absolute: 0.1 10*3/uL (ref 0.0–0.7)
HEMATOCRIT: 38.8 % (ref 36.0–46.0)
Hemoglobin: 13.3 g/dL (ref 12.0–15.0)
LYMPHS ABS: 2.7 10*3/uL (ref 0.7–4.0)
LYMPHS PCT: 38.9 % (ref 12.0–46.0)
MCHC: 34.1 g/dL (ref 30.0–36.0)
MCV: 91.5 fl (ref 78.0–100.0)
MONOS PCT: 5.3 % (ref 3.0–12.0)
Monocytes Absolute: 0.4 10*3/uL (ref 0.1–1.0)
NEUTROS ABS: 3.8 10*3/uL (ref 1.4–7.7)
Neutrophils Relative %: 53.7 % (ref 43.0–77.0)
PLATELETS: 196 10*3/uL (ref 150.0–400.0)
RBC: 4.24 Mil/uL (ref 3.87–5.11)
RDW: 12.2 % (ref 11.5–15.5)
WBC: 7 10*3/uL (ref 4.0–10.5)

## 2015-12-20 LAB — BASIC METABOLIC PANEL
BUN: 9 mg/dL (ref 6–23)
CALCIUM: 8.8 mg/dL (ref 8.4–10.5)
CHLORIDE: 104 meq/L (ref 96–112)
CO2: 29 meq/L (ref 19–32)
Creatinine, Ser: 0.81 mg/dL (ref 0.40–1.20)
GFR: 82.65 mL/min (ref >60.00)
GLUCOSE: 92 mg/dL (ref 70–99)
POTASSIUM: 3.9 meq/L (ref 3.5–5.1)
SODIUM: 139 meq/L (ref 135–145)

## 2015-12-20 LAB — HEPATIC FUNCTION PANEL
ALBUMIN: 3.9 g/dL (ref 3.5–5.2)
ALK PHOS: 59 U/L (ref 39–117)
ALT: 22 U/L (ref 0–35)
AST: 19 U/L (ref 0–37)
Bilirubin, Direct: 0 mg/dL (ref 0.0–0.3)
TOTAL PROTEIN: 7.2 g/dL (ref 6.0–8.3)
Total Bilirubin: 1 mg/dL (ref 0.2–1.2)

## 2015-12-20 LAB — LIPID PANEL
CHOL/HDL RATIO: 5
Cholesterol: 202 mg/dL — ABNORMAL HIGH (ref 0–200)
HDL: 40.2 mg/dL (ref >39.00)
LDL Cholesterol: 138 mg/dL — ABNORMAL HIGH (ref 0–99)
NONHDL: 161.47
TRIGLYCERIDES: 117 mg/dL (ref 0.0–149.0)
VLDL: 23.4 mg/dL (ref 0.0–40.0)

## 2015-12-20 LAB — TSH: TSH: 1.07 u[IU]/mL (ref 0.35–4.50)

## 2015-12-26 NOTE — Progress Notes (Signed)
Pre visit review using our clinic review tool, if applicable. No additional management support is needed unless otherwise documented below in the visit note.  Chief Complaint  Patient presents with  . Annual Exam    HPI: Patient  Autumn Cruz  40 y.o. comes in today for [place] visit  Having recent few days with knee pain possibly from excess walking up and down after moving residences. No specific injury. Right knee feels tender posteriorly and some in the front. Has had problems with her left knee before no falling only time she kneels this was praying but does have kneepads. Only uses the Lovenox when she travels travel this summer no events. Rash on left lower extremity no diagnosis was delayed in the ability to get into the dermatologist because she was waiting for the summer. Hasn't changed the thinks it was better when she put garlic [place] it. Has been given steroid topicals and others by the dermatologist with no help.   Health Maintenance  Topic Date Due  . HIV Screening  12/25/2016 (Originally 07/24/1990)  . PAP SMEAR  05/11/2018  . TETANUS/TDAP  04/03/2020  . INFLUENZA VACCINE  Completed   Health Maintenance Review LIFESTYLE:  Exercise:   Tobacco/ETS: no Alcohol:  no Sugar beverages:  rare Sleep:  7 hours  Drug use: no HH of 4  No pets  Work: Materials engineer work  Engineer, agricultural for summer .  lovenox when travel .      ROS:  GEN/ HEENT: No fever, significant weight changes sweats headaches vision problems hearing changes, CV/ PULM; No chest pain shortness of breath cough, syncope,edema  change in exercise tolerance. GI /GU: No adominal pain, vomiting, change in bowel habits. No blood in the stool. No significant GU symptoms. SKIN/HEME: ,no acute skin rashes suspicious lesions or bleeding. No lymphadenopathy, nodules, masses.  NEURO/ PSYCH:  No neurologic signs such as weakness numbness. No depression anxiety. IMM/ Allergy: No unusual infections.  Allergy .   REST  of 12 system review negative except as per HPI   Past Medical History:  Diagnosis Date  . DVT (deep venous thrombosis) ([place])   . Gestational diabetes   . High cholesterol   . Hx of thrombosis 2014   Left lower extrm.  Marland Kitchen Positive TB test   . Post-phlebitic syndrome 2014   Left  LE  . Thrombosis    Left leg preg tulip filter then removed.   . Tuberculosis    Positive test    Past Surgical History:  Procedure Laterality Date  . CESAREAN SECTION  2010 & 2012    Family History  Problem Relation Age of Onset  . Heart disease Mother     Blockage/Main artery  . Diabetes Mother   . Coronary artery disease Mother   . Hyperlipidemia Mother   . Hypertension Father   . Deep vein thrombosis Father     Social History   Social History  . Marital status: Married    Spouse name: [name]  . Number of children: 2  . Years of education: N/A   Occupational History  . Homemaker    Social History Main Topics  . Smoking status: Never Smoker  . Smokeless tobacco: Never Used  . Alcohol use No  . Drug use: No  . Sexual activity: Not Asked   Other Topics Concern  . None   Social History Narrative   Usually gets 6-7 hours of sleep per night   4 people living in the  home.   Originally from [place] has a bachelor's degree some training in nursing and alternative medicine.   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   Negative tobacco alcohol some caffeine   Gravida 2 para 2 last Pap 2013   Think she had T. 2012    Outpatient Medications Prior to Visit  Medication Sig Dispense Refill  . aspirin 81 MG tablet Take 81 mg by mouth daily.    Marland Kitchen enoxaparin (LOVENOX) 40 MG/0.4ML injection INJECT 0.4 ML (1 SYRINGE) INTO THE SKIN DAILY 30 Syringe 0  . Multiple Vitamin (MULTI-VITAMIN PO) Take by mouth.    . fluticasone (FLONASE) 50 MCG/ACT nasal spray Place 2 sprays into both  nostrils daily. 16 g 2  . omeprazole (PRILOSEC) 20 MG capsule Take 1 capsule (20 mg total) by mouth 2 (two) times daily before a meal. 60 capsule 0   No facility-administered medications prior to visit.      EXAM:  BP 100/66 (BP Location: Right Arm, Patient Position: Sitting, Cuff Size: Large)   Temp 97.9 F (36.6 C) (Oral)   Ht 5' 4.25" (1.632 m)   Wt 205 lb 9.6 oz (93.3 kg)   BMI 35.02 kg/m   Body mass index is 35.02 kg/m.  Physical Exam: Vital signs reviewed [id number] is a well-developed well-nourished alert cooperative    who appearsr stated age in no acute distress.  HEENT: normocephalic atraumatic , Eyes: PERRL EOM's full, conjunctiva clear, Nares: paten,t no deformity discharge or tenderness., Ears: no deformity EAC's clear TMs with normal landmarks. Mouth: clear OP, no lesions, edema.  Moist mucous membranes. Dentition in adequate repair. NECK: supple without masses, thyromegaly or bruits. CHEST/PULM:  Clear to auscultation and percussion breath sounds equal no wheeze , rales or rhonchi. No chest wall deformities or tenderness.Breast: normal by inspection . No dimpling, discharge, masses, tenderness or discharge . CV: PMI is nondisplaced, S1 S2 no gallops, murmurs, rubs. Peripheral pulses are full without delay.No JVD . Breast: normal by inspection . No dimpling, discharge, masses, tenderness or discharge . ABDOMEN: Bowel sounds normal nontender  No guard or rebound, no hepato splenomegal no CVA tenderness.  No hernia. Extremtities:  No clubbing cyanosis or edema, no acute joint swelling or redness no focal atrophy right knee was some mild patellar tenderness posterior fullness but no masses range of motion normal no crepitus negative drawer. NEURO:  Oriented x3, cranial nerves 3-12 appear to be intact, no obvious focal weakness,gait within normal limits no abnormal reflexes or asymmetrical SKIN: No acute rashes normal turgor, color, no bruising or petechiae. Scattered raised  keratotic-like lesions on the left lower Extremity. PSYCH: Oriented, good eye contact, no obvious depression anxiety, cognition and judgment appear normal. LN: no cervical axillary inguinal adenopathy  Lab Results  Component Value Date   WBC 7.0 12/20/2015   HGB 13.3 12/20/2015   HCT 38.8 12/20/2015   PLT 196.0 12/20/2015   GLUCOSE 92 12/20/2015   CHOL 202 (H) 12/20/2015   TRIG 117.0 12/20/2015   HDL 40.20 12/20/2015   LDLDIRECT 200.7 02/04/2013   LDLCALC 138 (H) 12/20/2015   ALT 22 12/20/2015   AST 19 12/20/2015   NA 139 12/20/2015   K 3.9 12/20/2015   CL 104 12/20/2015   CREATININE 0.81 12/20/2015   BUN 9 12/20/2015   CO2 29 12/20/2015   TSH 1.07 12/20/2015   HGBA1C 5.8 02/04/2013  Reviewed  ASSESSMENT AND PLAN:  Discussed the following assessment and plan:  Visit for preventive health examination  Medication management  Factor V Leiden mutation ([place])  Need for prophylactic vaccination and inoculation against influenza - Plan: Flu Vaccine QUAD 36+ mos PF IM (Fluarix & Fluzone Quad PF)  Hyperlipidemia - Modest elevation possibly genetic family history discussed Weight Watchers Asian diets decrease calories weight loss may help  Skin lesion of left leg - Still unknown diagnosis get back with dermatologist questioned need biopsy let us know and we can help.  Hx of thrombosis of lower extremity in pregnancy  Hypercoagulable state ([place]) - Lovenox 1 travels  Knee pain, acute, right - Possibly overuse bursitis possibly early arthritis discussed ice exercise follow-up if persistent.  BMI 35.0-35.9,adult bmi 35   Patient Care Team: Burnis Medin, MD as PCP - General (Internal Medicine) Annia Belt, MD as Consulting Physician (Oncology) Elam Dutch, MD as Consulting Physician (Vascular Surgery) Bobbye Charleston, MD as Consulting Physician (Obstetrics and Gynecology) Patient Instructions  Continue working on healthy lifestyle. Consider weigh watchers  type monitoring decreasing calories portion size and be as physically active as possible. Resistance training can also help change basal metabolic rate. Agree get back with dermatology about the rash on her left leg. Simple knee exercises but avoiding squats and bands may help your knees by keeping the muscles around the joints strong. You can use ibuprofen 600-800 mg every 8 hours in the short run or 2 Aleve twice a day or anti-inflammatory upper teeth. As long as you are not on your Lovenox.   Health Maintenance, Female Adopting a healthy lifestyle and getting preventive care can go a long way to promote health and wellness. Talk with your health care provider about what schedule of regular examinations is right for you. This is a good chance for you to check in with your provider about disease prevention and staying healthy. In between checkups, there are plenty of things you can do on your own. Experts have done a lot of research about which lifestyle changes and preventive measures are most likely to keep you healthy. Ask your health care provider for more information. WEIGHT AND DIET  Eat a healthy diet  Be sure to include plenty of vegetables, fruits, low-fat dairy products, and lean protein.  Do not eat a lot of foods high in solid fats, added sugars, or salt.  Get regular exercise. This is one of the most important things you can do for your health.  Most adults should exercise for at least 150 minutes each week. The exercise should increase your heart rate and make you sweat (moderate-intensity exercise).  Most adults should also do strengthening exercises at least twice a week. This is in addition to the moderate-intensity exercise.  Maintain a healthy weight  Body mass index (BMI) is a measurement that can be used to identify possible weight problems. It estimates body fat based on height and weight. Your health care provider can help determine your BMI and help you achieve or  maintain a healthy weight.  For females 41 years of age and older:   A BMI below 18.5 is considered underweight.  A BMI of 18.5 to 24.9 is normal.  A BMI of 25 to 29.9 is considered overweight.  A BMI of 30 and above is considered obese.  Watch levels of cholesterol and blood lipids  You should start having your blood tested for lipids and cholesterol at 41 years of age, then  have this test every 5 years.  You may need to have your cholesterol levels checked more often if:  Your lipid or cholesterol levels are high.  You are older than 41 years of age.  You are at high risk for heart disease.  CANCER SCREENING   Lung Cancer  Lung cancer screening is recommended for adults 46-32 years old who are at high risk for lung cancer because of a history of smoking.  A yearly low-dose CT scan of the lungs is recommended for people who:  Currently smoke.  Have quit within the past 15 years.  Have at least a 30-pack-year history of smoking. A pack year is smoking an average of one pack of cigarettes a day for 1 year.  Yearly screening should continue until it has been 15 years since you quit.  Yearly screening should stop if you develop a health problem that would prevent you from having lung cancer treatment.  Breast Cancer  Practice breast self-awareness. This means understanding how your breasts normally appear and feel.  It also means doing regular breast self-exams. Let your health care provider know about any changes, no matter how small.  If you are in your 20s or 30s, you should have a clinical breast exam (CBE) by a health care provider every 1-3 years as part of a regular health exam.  If you are 22 or older, have a CBE every year. Also consider having a breast X-ray (mammogram) every year.  If you have a family history of breast cancer, talk to your health care provider about genetic screening.  If you are at high risk for breast cancer, talk to your health care  provider about having an MRI and a mammogram every year.  Breast cancer gene (BRCA) assessment is recommended for women who have family members with BRCA-related cancers. BRCA-related cancers include:  Breast.  Ovarian.  Tubal.  Peritoneal cancers.  Results of the assessment will determine the need for genetic counseling and BRCA1 and BRCA2 testing. Cervical Cancer Your health care provider may recommend that you be screened regularly for cancer of the pelvic organs (ovaries, uterus, and vagina). This screening involves a pelvic examination, including checking for microscopic changes to the surface of your cervix (Pap test). You may be encouraged to have this screening done every 3 years, beginning at age 92.  For women ages 57-65, health care providers may recommend pelvic exams and Pap testing every 3 years, or they may recommend the Pap and pelvic exam, combined with testing for human papilloma virus (HPV), every 5 years. Some types of HPV increase your risk of cervical cancer. Testing for HPV may also be done on women of any age with unclear Pap test results.  Other health care providers may not recommend any screening for nonpregnant women who are considered low risk for pelvic cancer and who do not have symptoms. Ask your health care provider if a screening pelvic exam is right for you.  If you have had past treatment for cervical cancer or a condition that could lead to cancer, you need Pap tests and screening for cancer for at least 20 years after your treatment. If Pap tests have been discontinued, your risk factors (such as having a new sexual partner) need to be reassessed to determine if screening should resume. Some women have medical problems that increase the chance of getting cervical cancer. In these cases, your health care provider may recommend more frequent screening and Pap tests. Colorectal Cancer  This  type of cancer can be detected and often prevented.  Routine  colorectal cancer screening usually begins at 41 years of age and continues through 41 years of age.  Your health care provider may recommend screening at an earlier age if you have risk factors for colon cancer.  Your health care provider may also recommend using home test kits to check for hidden blood in the stool.  A small camera at the end of a tube can be used to examine your colon directly (sigmoidoscopy or colonoscopy). This is done to check for the earliest forms of colorectal cancer.  Routine screening usually begins at age 59.  Direct examination of the colon should be repeated every 5-10 years through 41 years of age. However, you may need to be screened more often if early forms of precancerous polyps or small growths are found. Skin Cancer  Check your skin from head to toe regularly.  Tell your health care provider about any new moles or changes in moles, especially if there is a change in a mole's shape or color.  Also tell your health care provider if you have a mole that is larger than the size of a pencil eraser.  Always use sunscreen. Apply sunscreen liberally and repeatedly throughout the day.  Protect yourself by wearing long sleeves, pants, a wide-brimmed hat, and sunglasses whenever you are outside. HEART DISEASE, DIABETES, AND HIGH BLOOD PRESSURE   High blood pressure causes heart disease and increases the risk of stroke. High blood pressure is more likely to develop in:  People who have blood pressure in the high end of the normal range (130-139/85-89 mm Hg).  People who are overweight or obese.  People who are African American.  If you are 50-85 years of age, have your blood pressure checked every 3-5 years. If you are 59 years of age or older, have your blood pressure checked every year. You should have your blood pressure measured twice--once when you are at a hospital or clinic, and once when you are not at a hospital or clinic. Record the average of the  two measurements. To check your blood pressure when you are not at a hospital or clinic, you can use:  An automated blood pressure machine at a pharmacy.  A home blood pressure monitor.  If you are between 10 years and 2 years old, ask your health care provider if you should take aspirin to prevent strokes.  Have regular diabetes screenings. This involves taking a blood sample to check your fasting blood sugar level.  If you are at a normal weight and have a low risk for diabetes, have this test once every three years after 41 years of age.  If you are overweight and have a high risk for diabetes, consider being tested at a younger age or more often. PREVENTING INFECTION  Hepatitis B  If you have a higher risk for hepatitis B, you should be screened for this virus. You are considered at high risk for hepatitis B if:  You were born in a country where hepatitis B is common. Ask your health care provider which countries are considered high risk.  Your parents were born in a high-risk country, and you have not been immunized against hepatitis B (hepatitis B vaccine).  You have HIV or AIDS.  You use needles to inject street drugs.  You live with someone who has hepatitis B.  You have had sex with someone who has hepatitis B.  You get hemodialysis treatment.  You take certain medicines for conditions, including cancer, organ transplantation, and autoimmune conditions. Hepatitis C  Blood testing is recommended for:  Everyone born from [birth year] through 1966.  Anyone with known risk factors for hepatitis C. Sexually transmitted infections (STIs)  You should be screened for sexually transmitted infections (STIs) including gonorrhea and chlamydia if:  You are sexually active and are younger than 41 years of age.  You are older than 41 years of age and your health care provider tells you that you are at risk for this type of infection.  Your sexual activity has changed since you were  last screened and you are at an increased risk for chlamydia or gonorrhea. Ask your health care provider if you are at risk.  If you do not have HIV, but are at risk, it may be recommended that you take a prescription medicine daily to prevent HIV infection. This is called pre-exposure prophylaxis (PrEP). You are considered at risk if:  You are sexually active and do not regularly use condoms or know the HIV status of your partner(s).  You take drugs by injection.  You are sexually active with a partner who has HIV. Talk with your health care provider about whether you are at high risk of being infected with HIV. If you choose to begin PrEP, you should first be tested for HIV. You should then be tested every 3 months for as long as you are taking PrEP.  PREGNANCY   If you are premenopausal and you may become pregnant, ask your health care provider about preconception counseling.  If you may become pregnant, take 400 to 800 micrograms (mcg) of folic acid every day.  If you want to prevent pregnancy, talk to your health care provider about birth control (contraception). OSTEOPOROSIS AND MENOPAUSE   Osteoporosis is a disease in which the bones lose minerals and strength with aging. This can result in serious bone fractures. Your risk for osteoporosis can be identified using a bone density scan.  If you are 43 years of age or older, or if you are at risk for osteoporosis and fractures, ask your health care provider if you should be screened.  Ask your health care provider whether you should take a calcium or vitamin D supplement to lower your risk for osteoporosis.  Menopause may have certain physical symptoms and risks.  Hormone replacement therapy may reduce some of these symptoms and risks. Talk to your health care provider about whether hormone replacement therapy is right for you.  HOME CARE INSTRUCTIONS   Schedule regular health, dental, and eye exams.  Stay current with your  immunizations.   Do not use any tobacco products including cigarettes, chewing tobacco, or electronic cigarettes.  If you are pregnant, do not drink alcohol.  If you are breastfeeding, limit how much and how often you drink alcohol.  Limit alcohol intake to no more than 1 drink per day for nonpregnant women. One drink equals 12 ounces of beer, 5 ounces of wine, or 1 ounces of hard liquor.  Do not use street drugs.  Do not share needles.  Ask your health care provider for help if you need support or information about quitting drugs.  Tell your health care provider if you often feel depressed.  Tell your health care provider if you have ever been abused or do not feel safe at home.   This information is not intended to replace advice given to you by your health care provider. Make sure you discuss  any questions you have with your health care provider.      Document Released: 10/02/2010 Document Revised: 04/09/2014 Document Reviewed: 02/18/2013 Elsevier Interactive Patient Education 2016 [employer]   Generic Knee Exercises EXERCISES RANGE OF MOTION (ROM) AND STRETCHING EXERCISES These exercises may help you when beginning to rehabilitate your injury. Your symptoms may resolve with or without further involvement from your physician, physical therapist, or athletic trainer. While completing these exercises, remember:   Restoring tissue flexibility helps normal motion to return to the joints. This allows healthier, less painful movement and activity.  An effective stretch should be held for at least 30 seconds.  A stretch should never be painful. You should only feel a gentle lengthening or release in the stretched tissue. STRETCH - Knee Extension, Prone  Lie on your stomach on a firm surface, such as a bed or countertop. Place your right / left knee and leg just beyond the edge of the surface. You may wish to place a towel under the far end of your right / left thigh for  comfort.  Relax your leg muscles and allow gravity to straighten your knee. Your clinician may advise you to add an ankle weight if more resistance is helpful for you.  You should feel a stretch in the back of your right / left knee. Hold this position for __________ seconds. Repeat __________ times. Complete this stretch __________ times per day. * Your physician, physical therapist, or athletic trainer may ask you to add ankle weight to enhance your stretch.  RANGE OF MOTION - Knee Flexion, Active  Lie on your back with both knees straight. (If this causes back discomfort, bend your opposite knee, placing your foot flat on the floor.)  Slowly slide your heel back toward your buttocks until you feel a gentle stretch in the front of your knee or thigh.  Hold for __________ seconds. Slowly slide your heel back to the starting position. Repeat __________ times. Complete this exercise __________ times per day.  STRETCH - Quadriceps, Prone   Lie on your stomach on a firm surface, such as a bed or padded floor.  Bend your right / left knee and grasp your ankle. If you are unable to reach your ankle or pant leg, use a belt around your foot to lengthen your reach.  Gently pull your heel toward your buttocks. Your knee should not slide out to the side. You should feel a stretch in the front of your thigh and/or knee.  Hold this position for __________ seconds. Repeat __________ times. Complete this stretch __________ times per day.  STRETCH - Hamstrings, Supine   Lie on your back. Loop a belt or towel over the ball of your right / left foot.  Straighten your right / left knee and slowly pull on the belt to raise your leg. Do not allow the right / left knee to bend. Keep your opposite leg flat on the floor.  Raise the leg until you feel a gentle stretch behind your right / left knee or thigh. Hold this position for __________ seconds. Repeat __________ times. Complete this stretch __________  times per day.  STRENGTHENING EXERCISES These exercises may help you when beginning to rehabilitate your injury. They may resolve your symptoms with or without further involvement from your physician, physical therapist, or athletic trainer. While completing these exercises, remember:   Muscles can gain both the endurance and the strength needed for everyday activities through controlled exercises.  Complete these exercises as instructed  by your physician, physical therapist, or athletic trainer. Progress the resistance and repetitions only as guided.  You may experience muscle soreness or fatigue, but the pain or discomfort you are trying to eliminate should never worsen during these exercises. If this pain does worsen, stop and make certain you are following the directions exactly. If the pain is still present after adjustments, discontinue the exercise until you can discuss the trouble with your clinician. STRENGTH - Quadriceps, Isometrics  Lie on your back with your right / left leg extended and your opposite knee bent.  Gradually tense the muscles in the front of your right / left thigh. You should see either your knee cap slide up toward your hip or increased dimpling just above the knee. This motion will push the back of the knee down toward the floor/mat/bed on which you are lying.  Hold the muscle as tight as you can without increasing your pain for __________ seconds.  Relax the muscles slowly and completely in between each repetition. Repeat __________ times. Complete this exercise __________ times per day.  STRENGTH - Quadriceps, Short Arcs   Lie on your back. Place a __________ inch towel roll under your knee so that the knee slightly bends.  Raise only your lower leg by tightening the muscles in the front of your thigh. Do not allow your thigh to rise.  Hold this position for __________ seconds. Repeat __________ times. Complete this exercise __________ times per day.    OPTIONAL ANKLE WEIGHTS: Begin with ____________________, but DO NOT exceed ____________________. Increase in 1 pound/0.5 kilogram increments.  STRENGTH - Quadriceps, Straight Leg Raises  Quality counts! Watch for signs that the quadriceps muscle is working to insure you are strengthening the correct muscles and not "cheating" by substituting with healthier muscles.  Lay on your back with your right / left leg extended and your opposite knee bent.  Tense the muscles in the front of your right / left thigh. You should see either your knee cap slide up or increased dimpling just above the knee. Your thigh may even quiver.  Tighten these muscles even more and raise your leg 4 to 6 inches off the floor. Hold for __________ seconds.  Keeping these muscles tense, lower your leg.  Relax the muscles slowly and completely in between each repetition. Repeat __________ times. Complete this exercise __________ times per day.  STRENGTH - Hamstring, Curls  Lay on your stomach with your legs extended. (If you lay on a bed, your feet may hang over the edge.)  Tighten the muscles in the back of your thigh to bend your right / left knee up to 90 degrees. Keep your hips flat on the bed/floor.  Hold this position for __________ seconds.  Slowly lower your leg back to the starting position. Repeat __________ times. Complete this exercise __________ times per day.  OPTIONAL ANKLE WEIGHTS: Begin with ____________________, but DO NOT exceed ____________________. Increase in 1 pound/0.5 kilogram increments.  STRENGTH - Quadriceps, Squats  Stand in a door frame so that your feet and knees are in line with the frame.  Use your hands for balance, not support, on the frame.  Slowly lower your weight, bending at the hips and knees. Keep your lower legs upright so that they are parallel with the door frame. Squat only within the range that does not increase your knee pain. Never let your hips drop below your  knees.  Slowly return upright, pushing with your legs, not pulling with your hands.  Repeat __________ times. Complete this exercise __________ times per day.  STRENGTH - Quadriceps, Wall Slides  Follow guidelines for form closely. Increased knee pain often results from poorly placed feet or knees.  Lean against a smooth wall or door and walk your feet out 18-24 inches. Place your feet hip-width apart.  Slowly slide down the wall or door until your knees bend __________ degrees.* Keep your knees over your heels, not your toes, and in line with your hips, not falling to either side.  Hold for __________ seconds. Stand up to rest for __________ seconds in between each repetition. Repeat __________ times. Complete this exercise __________ times per day. * Your physician, physical therapist, or athletic trainer will alter this angle based on your symptoms and progress.   This information is not intended to replace advice given to you by your health care provider. Make sure you discuss any questions you have with your health care provider.   Document Released: 01/31/2005 Document Revised: 04/09/2014 Document Reviewed: 07/01/2008 [employer] Interactive Patient Education 2016 [place]. Kadiatou Oplinger M.D.

## 2015-12-27 ENCOUNTER — Encounter: Payer: Self-pay | Admitting: Internal Medicine

## 2015-12-27 ENCOUNTER — Ambulatory Visit (INDEPENDENT_AMBULATORY_CARE_PROVIDER_SITE_OTHER): Payer: 59 | Admitting: Internal Medicine

## 2015-12-27 VITALS — BP 100/66 | Temp 97.9°F | Ht 64.25 in | Wt 205.6 lb

## 2015-12-27 DIAGNOSIS — L989 Disorder of the skin and subcutaneous tissue, unspecified: Secondary | ICD-10-CM | POA: Diagnosis not present

## 2015-12-27 DIAGNOSIS — Z23 Encounter for immunization: Secondary | ICD-10-CM | POA: Diagnosis not present

## 2015-12-27 DIAGNOSIS — D6859 Other primary thrombophilia: Secondary | ICD-10-CM

## 2015-12-27 DIAGNOSIS — E785 Hyperlipidemia, unspecified: Secondary | ICD-10-CM

## 2015-12-27 DIAGNOSIS — D6851 Activated protein C resistance: Secondary | ICD-10-CM

## 2015-12-27 DIAGNOSIS — Z0000 Encounter for general adult medical examination without abnormal findings: Secondary | ICD-10-CM | POA: Diagnosis not present

## 2015-12-27 DIAGNOSIS — Z6835 Body mass index (BMI) 35.0-35.9, adult: Secondary | ICD-10-CM

## 2015-12-27 DIAGNOSIS — M25561 Pain in right knee: Secondary | ICD-10-CM

## 2015-12-27 DIAGNOSIS — Z79899 Other long term (current) drug therapy: Secondary | ICD-10-CM | POA: Diagnosis not present

## 2015-12-27 DIAGNOSIS — Z86718 Personal history of other venous thrombosis and embolism: Secondary | ICD-10-CM

## 2015-12-27 NOTE — Patient Instructions (Signed)
Continue working on healthy lifestyle. Consider weigh watchers type monitoring decreasing calories portion size and be as physically active as possible. Resistance training can also help change basal metabolic rate. Agree get back with dermatology about the rash on her left leg. Simple knee exercises but avoiding squats and bands may help your knees by keeping the muscles around the joints strong. You can use ibuprofen 600-800 mg every 8 hours in the short run or 2 Aleve twice a day or anti-inflammatory upper teeth. As long as you are not on your Lovenox.   Health Maintenance, Female Adopting a healthy lifestyle and getting preventive care can go a long way to promote health and wellness. Talk with your health care provider about what schedule of regular examinations is right for you. This is a good chance for you to check in with your provider about disease prevention and staying healthy. In between checkups, there are plenty of things you can do on your own. Experts have done a lot of research about which lifestyle changes and preventive measures are most likely to keep you healthy. Ask your health care provider for more information. WEIGHT AND DIET  Eat a healthy diet  Be sure to include plenty of vegetables, fruits, low-fat dairy products, and lean protein.  Do not eat a lot of foods high in solid fats, added sugars, or salt.  Get regular exercise. This is one of the most important things you can do for your health.  Most adults should exercise for at least 150 minutes each week. The exercise should increase your heart rate and make you sweat (moderate-intensity exercise).  Most adults should also do strengthening exercises at least twice a week. This is in addition to the moderate-intensity exercise.  Maintain a healthy weight  Body mass index (BMI) is a measurement that can be used to identify possible weight problems. It estimates body fat based on height and weight. Your health care  provider can help determine your BMI and help you achieve or maintain a healthy weight.  For females 4 years of age and older:   A BMI below 18.5 is considered underweight.  A BMI of 18.5 to 24.9 is normal.  A BMI of 25 to 29.9 is considered overweight.  A BMI of 30 and above is considered obese.  Watch levels of cholesterol and blood lipids  You should start having your blood tested for lipids and cholesterol at 41 years of age, then have this test every 5 years.  You may need to have your cholesterol levels checked more often if:  Your lipid or cholesterol levels are high.  You are older than 41 years of age.  You are at high risk for heart disease.  CANCER SCREENING   Lung Cancer  Lung cancer screening is recommended for adults 28-30 years old who are at high risk for lung cancer because of a history of smoking.  A yearly low-dose CT scan of the lungs is recommended for people who:  Currently smoke.  Have quit within the past 15 years.  Have at least a 30-pack-year history of smoking. A pack year is smoking an average of one pack of cigarettes a day for 1 year.  Yearly screening should continue until it has been 15 years since you quit.  Yearly screening should stop if you develop a health problem that would prevent you from having lung cancer treatment.  Breast Cancer  Practice breast self-awareness. This means understanding how your breasts normally appear and feel.  It also means doing regular breast self-exams. Let your health care provider know about any changes, no matter how small.  If you are in your 20s or 30s, you should have a clinical breast exam (CBE) by a health care provider every 1-3 years as part of a regular health exam.  If you are 40 or older, have a CBE every year. Also consider having a breast X-ray (mammogram) every year.  If you have a family history of breast cancer, talk to your health care provider about genetic screening.  If you  are at high risk for breast cancer, talk to your health care provider about having an MRI and a mammogram every year.  Breast cancer gene (BRCA) assessment is recommended for women who have family members with BRCA-related cancers. BRCA-related cancers include:  Breast.  Ovarian.  Tubal.  Peritoneal cancers.  Results of the assessment will determine the need for genetic counseling and BRCA1 and BRCA2 testing. Cervical Cancer Your health care provider may recommend that you be screened regularly for cancer of the pelvic organs (ovaries, uterus, and vagina). This screening involves a pelvic examination, including checking for microscopic changes to the surface of your cervix (Pap test). You may be encouraged to have this screening done every 3 years, beginning at age 21.  For women ages 30-65, health care providers may recommend pelvic exams and Pap testing every 3 years, or they may recommend the Pap and pelvic exam, combined with testing for human papilloma virus (HPV), every 5 years. Some types of HPV increase your risk of cervical cancer. Testing for HPV may also be done on women of any age with unclear Pap test results.  Other health care providers may not recommend any screening for nonpregnant women who are considered low risk for pelvic cancer and who do not have symptoms. Ask your health care provider if a screening pelvic exam is right for you.  If you have had past treatment for cervical cancer or a condition that could lead to cancer, you need Pap tests and screening for cancer for at least 20 years after your treatment. If Pap tests have been discontinued, your risk factors (such as having a new sexual partner) need to be reassessed to determine if screening should resume. Some women have medical problems that increase the chance of getting cervical cancer. In these cases, your health care provider may recommend more frequent screening and Pap tests. Colorectal Cancer  This type of  cancer can be detected and often prevented.  Routine colorectal cancer screening usually begins at 41 years of age and continues through 41 years of age.  Your health care provider may recommend screening at an earlier age if you have risk factors for colon cancer.  Your health care provider may also recommend using home test kits to check for hidden blood in the stool.  A small camera at the end of a tube can be used to examine your colon directly (sigmoidoscopy or colonoscopy). This is done to check for the earliest forms of colorectal cancer.  Routine screening usually begins at age 50.  Direct examination of the colon should be repeated every 5-10 years through 41 years of age. However, you may need to be screened more often if early forms of precancerous polyps or small growths are found. Skin Cancer  Check your skin from head to toe regularly.  Tell your health care provider about any new moles or changes in moles, especially if there is a change in a   mole's shape or color.  Also tell your health care provider if you have a mole that is larger than the size of a pencil eraser.  Always use sunscreen. Apply sunscreen liberally and repeatedly throughout the day.  Protect yourself by wearing long sleeves, pants, a wide-brimmed hat, and sunglasses whenever you are outside. HEART DISEASE, DIABETES, AND HIGH BLOOD PRESSURE   High blood pressure causes heart disease and increases the risk of stroke. High blood pressure is more likely to develop in:  People who have blood pressure in the high end of the normal range (130-139/85-89 mm Hg).  People who are overweight or obese.  People who are African American.  If you are 18-39 years of age, have your blood pressure checked every 3-5 years. If you are 40 years of age or older, have your blood pressure checked every year. You should have your blood pressure measured twice--once when you are at a hospital or clinic, and once when you are  not at a hospital or clinic. Record the average of the two measurements. To check your blood pressure when you are not at a hospital or clinic, you can use:  An automated blood pressure machine at a pharmacy.  A home blood pressure monitor.  If you are between 55 years and 79 years old, ask your health care provider if you should take aspirin to prevent strokes.  Have regular diabetes screenings. This involves taking a blood sample to check your fasting blood sugar level.  If you are at a normal weight and have a low risk for diabetes, have this test once every three years after 41 years of age.  If you are overweight and have a high risk for diabetes, consider being tested at a younger age or more often. PREVENTING INFECTION  Hepatitis B  If you have a higher risk for hepatitis B, you should be screened for this virus. You are considered at high risk for hepatitis B if:  You were born in a country where hepatitis B is common. Ask your health care provider which countries are considered high risk.  Your parents were born in a high-risk country, and you have not been immunized against hepatitis B (hepatitis B vaccine).  You have HIV or AIDS.  You use needles to inject street drugs.  You live with someone who has hepatitis B.  You have had sex with someone who has hepatitis B.  You get hemodialysis treatment.  You take certain medicines for conditions, including cancer, organ transplantation, and autoimmune conditions. Hepatitis C  Blood testing is recommended for:  Everyone born from 1945 through 1965.  Anyone with known risk factors for hepatitis C. Sexually transmitted infections (STIs)  You should be screened for sexually transmitted infections (STIs) including gonorrhea and chlamydia if:  You are sexually active and are younger than 41 years of age.  You are older than 41 years of age and your health care provider tells you that you are at risk for this type of  infection.  Your sexual activity has changed since you were last screened and you are at an increased risk for chlamydia or gonorrhea. Ask your health care provider if you are at risk.  If you do not have HIV, but are at risk, it may be recommended that you take a prescription medicine daily to prevent HIV infection. This is called pre-exposure prophylaxis (PrEP). You are considered at risk if:  You are sexually active and do not regularly use condoms or know   the HIV status of your partner(s).  You take drugs by injection.  You are sexually active with a partner who has HIV. Talk with your health care provider about whether you are at high risk of being infected with HIV. If you choose to begin PrEP, you should first be tested for HIV. You should then be tested every 3 months for as long as you are taking PrEP.  PREGNANCY   If you are premenopausal and you may become pregnant, ask your health care provider about preconception counseling.  If you may become pregnant, take 400 to 800 micrograms (mcg) of folic acid every day.  If you want to prevent pregnancy, talk to your health care provider about birth control (contraception). OSTEOPOROSIS AND MENOPAUSE   Osteoporosis is a disease in which the bones lose minerals and strength with aging. This can result in serious bone fractures. Your risk for osteoporosis can be identified using a bone density scan.  If you are 8 years of age or older, or if you are at risk for osteoporosis and fractures, ask your health care provider if you should be screened.  Ask your health care provider whether you should take a calcium or vitamin D supplement to lower your risk for osteoporosis.  Menopause may have certain physical symptoms and risks.  Hormone replacement therapy may reduce some of these symptoms and risks. Talk to your health care provider about whether hormone replacement therapy is right for you.  HOME CARE INSTRUCTIONS   Schedule regular  health, dental, and eye exams.  Stay current with your immunizations.   Do not use any tobacco products including cigarettes, chewing tobacco, or electronic cigarettes.  If you are pregnant, do not drink alcohol.  If you are breastfeeding, limit how much and how often you drink alcohol.  Limit alcohol intake to no more than 1 drink per day for nonpregnant women. One drink equals 12 ounces of beer, 5 ounces of wine, or 1 ounces of hard liquor.  Do not use street drugs.  Do not share needles.  Ask your health care provider for help if you need support or information about quitting drugs.  Tell your health care provider if you often feel depressed.  Tell your health care provider if you have ever been abused or do not feel safe at home.   This information is not intended to replace advice given to you by your health care provider. Make sure you discuss any questions you have with your health care provider.      Document Released: 10/02/2010 Document Revised: 04/09/2014 Document Reviewed: 02/18/2013 Elsevier Interactive Patient Education 2016 [employer]   Generic Knee Exercises EXERCISES RANGE OF MOTION (ROM) AND STRETCHING EXERCISES These exercises may help you when beginning to rehabilitate your injury. Your symptoms may resolve with or without further involvement from your physician, physical therapist, or athletic trainer. While completing these exercises, remember:   Restoring tissue flexibility helps normal motion to return to the joints. This allows healthier, less painful movement and activity.  An effective stretch should be held for at least 30 seconds.  A stretch should never be painful. You should only feel a gentle lengthening or release in the stretched tissue. STRETCH - Knee Extension, Prone  Lie on your stomach on a firm surface, such as a bed or countertop. Place your right / left knee and leg just beyond the edge of the surface. You may wish to place a  towel under the far end of your right /  left thigh for comfort.  Relax your leg muscles and allow gravity to straighten your knee. Your clinician may advise you to add an ankle weight if more resistance is helpful for you.  You should feel a stretch in the back of your right / left knee. Hold this position for __________ seconds. Repeat __________ times. Complete this stretch __________ times per day. * Your physician, physical therapist, or athletic trainer may ask you to add ankle weight to enhance your stretch.  RANGE OF MOTION - Knee Flexion, Active  Lie on your back with both knees straight. (If this causes back discomfort, bend your opposite knee, placing your foot flat on the floor.)  Slowly slide your heel back toward your buttocks until you feel a gentle stretch in the front of your knee or thigh.  Hold for __________ seconds. Slowly slide your heel back to the starting position. Repeat __________ times. Complete this exercise __________ times per day.  STRETCH - Quadriceps, Prone   Lie on your stomach on a firm surface, such as a bed or padded floor.  Bend your right / left knee and grasp your ankle. If you are unable to reach your ankle or pant leg, use a belt around your foot to lengthen your reach.  Gently pull your heel toward your buttocks. Your knee should not slide out to the side. You should feel a stretch in the front of your thigh and/or knee.  Hold this position for __________ seconds. Repeat __________ times. Complete this stretch __________ times per day.  STRETCH - Hamstrings, Supine   Lie on your back. Loop a belt or towel over the ball of your right / left foot.  Straighten your right / left knee and slowly pull on the belt to raise your leg. Do not allow the right / left knee to bend. Keep your opposite leg flat on the floor.  Raise the leg until you feel a gentle stretch behind your right / left knee or thigh. Hold this position for __________  seconds. Repeat __________ times. Complete this stretch __________ times per day.  STRENGTHENING EXERCISES These exercises may help you when beginning to rehabilitate your injury. They may resolve your symptoms with or without further involvement from your physician, physical therapist, or athletic trainer. While completing these exercises, remember:   Muscles can gain both the endurance and the strength needed for everyday activities through controlled exercises.  Complete these exercises as instructed by your physician, physical therapist, or athletic trainer. Progress the resistance and repetitions only as guided.  You may experience muscle soreness or fatigue, but the pain or discomfort you are trying to eliminate should never worsen during these exercises. If this pain does worsen, stop and make certain you are following the directions exactly. If the pain is still present after adjustments, discontinue the exercise until you can discuss the trouble with your clinician. STRENGTH - Quadriceps, Isometrics  Lie on your back with your right / left leg extended and your opposite knee bent.  Gradually tense the muscles in the front of your right / left thigh. You should see either your knee cap slide up toward your hip or increased dimpling just above the knee. This motion will push the back of the knee down toward the floor/mat/bed on which you are lying.  Hold the muscle as tight as you can without increasing your pain for __________ seconds.  Relax the muscles slowly and completely in between each repetition. Repeat __________ times. Complete this exercise  __________ times per day.  STRENGTH - Quadriceps, Short Arcs   Lie on your back. Place a __________ inch towel roll under your knee so that the knee slightly bends.  Raise only your lower leg by tightening the muscles in the front of your thigh. Do not allow your thigh to rise.  Hold this position for __________ seconds. Repeat  __________ times. Complete this exercise __________ times per day.  OPTIONAL ANKLE WEIGHTS: Begin with ____________________, but DO NOT exceed ____________________. Increase in 1 pound/0.5 kilogram increments.  STRENGTH - Quadriceps, Straight Leg Raises  Quality counts! Watch for signs that the quadriceps muscle is working to insure you are strengthening the correct muscles and not "cheating" by substituting with healthier muscles.  Lay on your back with your right / left leg extended and your opposite knee bent.  Tense the muscles in the front of your right / left thigh. You should see either your knee cap slide up or increased dimpling just above the knee. Your thigh may even quiver.  Tighten these muscles even more and raise your leg 4 to 6 inches off the floor. Hold for __________ seconds.  Keeping these muscles tense, lower your leg.  Relax the muscles slowly and completely in between each repetition. Repeat __________ times. Complete this exercise __________ times per day.  STRENGTH - Hamstring, Curls  Lay on your stomach with your legs extended. (If you lay on a bed, your feet may hang over the edge.)  Tighten the muscles in the back of your thigh to bend your right / left knee up to 90 degrees. Keep your hips flat on the bed/floor.  Hold this position for __________ seconds.  Slowly lower your leg back to the starting position. Repeat __________ times. Complete this exercise __________ times per day.  OPTIONAL ANKLE WEIGHTS: Begin with ____________________, but DO NOT exceed ____________________. Increase in 1 pound/0.5 kilogram increments.  STRENGTH - Quadriceps, Squats  Stand in a door frame so that your feet and knees are in line with the frame.  Use your hands for balance, not support, on the frame.  Slowly lower your weight, bending at the hips and knees. Keep your lower legs upright so that they are parallel with the door frame. Squat only within the range that does not  increase your knee pain. Never let your hips drop below your knees.  Slowly return upright, pushing with your legs, not pulling with your hands. Repeat __________ times. Complete this exercise __________ times per day.  STRENGTH - Quadriceps, Wall Slides  Follow guidelines for form closely. Increased knee pain often results from poorly placed feet or knees.  Lean against a smooth wall or door and walk your feet out 18-24 inches. Place your feet hip-width apart.  Slowly slide down the wall or door until your knees bend __________ degrees.* Keep your knees over your heels, not your toes, and in line with your hips, not falling to either side.  Hold for __________ seconds. Stand up to rest for __________ seconds in between each repetition. Repeat __________ times. Complete this exercise __________ times per day. * Your physician, physical therapist, or athletic trainer will alter this angle based on your symptoms and progress.   This information is not intended to replace advice given to you by your health care provider. Make sure you discuss any questions you have with your health care provider.   Document Released: 01/31/2005 Document Revised: 04/09/2014 Document Reviewed: 07/01/2008 Elsevier Interactive Patient Education [employer].

## 2016-01-16 ENCOUNTER — Telehealth: Payer: Self-pay | Admitting: Internal Medicine

## 2016-01-16 DIAGNOSIS — M25562 Pain in left knee: Principal | ICD-10-CM

## 2016-01-16 DIAGNOSIS — M25561 Pain in right knee: Secondary | ICD-10-CM

## 2016-01-16 NOTE — Telephone Encounter (Signed)
I would suggest we get  You [facility] appt. If she agrees  Please do referral .

## 2016-01-16 NOTE — Telephone Encounter (Signed)
Pt state that her knees are still hurting and it is very difficulty for her to get up and down.

## 2016-01-16 NOTE — Telephone Encounter (Signed)
Simple knee exercises but avoiding squats and bands may help your knees by keeping the muscles around the joints strong. You can use ibuprofen 600-800 mg every 8 hours in the short run or 2 Aleve twice a day or anti-inflammatory upper teeth. As long as you are not on your Lovenox. - per last note   Spoke to the pt.  She is taking ibuprofen and naproxen for pain.  Alternating.  Only using once daily in the morning.  Pain is about the same overall.  Sometimes can be worse.  She has been trying the knee exercises but cannot do much.  Hurts her to move in those ways.  Pt would like to know what to do next.  Please advise.

## 2016-01-16 NOTE — Telephone Encounter (Signed)
Spoke to the pt and she agreed to sports medicine.  Referral placed in the system.

## 2016-01-23 ENCOUNTER — Encounter: Payer: Self-pay | Admitting: Sports Medicine

## 2016-01-23 ENCOUNTER — Ambulatory Visit (INDEPENDENT_AMBULATORY_CARE_PROVIDER_SITE_OTHER): Payer: 59 | Admitting: Sports Medicine

## 2016-01-23 ENCOUNTER — Ambulatory Visit
Admission: RE | Admit: 2016-01-23 | Discharge: 2016-01-23 | Disposition: A | Discharge status: Home / Self Care | Payer: 59 | Source: Ambulatory Visit | Attending: Sports Medicine | Admitting: Sports Medicine

## 2016-01-23 ENCOUNTER — Other Ambulatory Visit: Payer: Self-pay | Admitting: Sports Medicine

## 2016-01-23 VITALS — BP 116/65 | Ht 65.5 in | Wt 203.0 lb

## 2016-01-23 DIAGNOSIS — M25561 Pain in right knee: Secondary | ICD-10-CM

## 2016-01-23 DIAGNOSIS — M25562 Pain in left knee: Secondary | ICD-10-CM | POA: Diagnosis not present

## 2016-01-23 DIAGNOSIS — G8929 Other chronic pain: Secondary | ICD-10-CM | POA: Diagnosis not present

## 2016-01-24 ENCOUNTER — Telehealth: Payer: Self-pay | Admitting: Sports Medicine

## 2016-01-24 ENCOUNTER — Encounter: Payer: Self-pay | Admitting: Sports Medicine

## 2016-01-24 NOTE — Progress Notes (Signed)
   Subjective:    Patient ID: Autumn Cruz, female    DOB: 07-May-1975, 40 y.o.   MRN: [id number]  HPI chief complaint: Bilateral knee pain  Very pleasant 40 year old female comes in today complaining of long-standing chronic bilateral knee pain. She brought extensive records with her from her previous treating physicians in [place]. These records include MR arthrograms of both knees done in 2010. In reviewing the records, it looks like she has been diagnosed in the past with osteoarthritis and a small degenerative osteochondral defect in the right knee. Patient had Visco supplementation (Euflexa) in 2011 with excellent relief up until recently. Her pain has now returned. Her pain is worse in the right knee. She does get some intermittent swelling. Her pain is most noticeable with going up and down stairs. She alternates between naproxen sodium, ibuprofen, and Tylenol for pain. This does seem to help. She denies any recent trauma. No prior knee surgeries. No recent imaging.  Past medical history reviewed Medications reviewed Allergies reviewed    Review of Systems    as above Objective:   Physical Exam  Well-developed, well-nourished. No acute distress. Sitting comfortably in exam room. Vital signs reviewed  Left knee: Full range of motion. No effusion. There is some tethering of the patella laterally. Trace patellofemoral crepitus. Negative patellar grind. No joint line tenderness. Knee is stable to ligamentous testing. Neurovascularly intact distally.  Right knee: Full range of motion. No effusion. Some tethering of the patella laterally. Trace patellofemoral crepitus. Negative patellar grind. No joint line tenderness. Knee is stable to valgus and varus stressing. Neurovascularly intact distally.  MRI arthrogram of the left knee in 2010 is fairly unremarkable. MRI arthrogram of the right knee done in 2010 does show findings consistent with patellofemoral DJD. There is a mild amount of loss of  the articular cartilage of the lateral patellar facet with a small focal OCD seen in this area. Remainder of that study is unremarkable. Neither study shows any sort of meniscal pathology.      Assessment & Plan:   Bilateral knee pain, right greater than left, likely secondary to chondromalacia patella/early patellofemoral DJD  Patient definitely has findings consistent with early DJD of the right patellofemoral joint on her MRI from 2010. She probably has some mild patellofemoral DJD of the left knee as well. I would like to start with getting some updated x-rays and I'll call the patient with those results once available. Since she has had success with Visco supplementation in the past, I may recommend referral to one of the orthopedic groups for repeat injections. Alternatively, we could start with a cortisone injection first. We'll delineate a more definitive treatment plan after I review her x-rays.

## 2016-01-24 NOTE — Telephone Encounter (Signed)
I spoke with the patient on the phone today after reviewing x-rays of both knees. She does have some mild degenerative changes in both knees including at the patellofemoral joint. I discussed treatment options including physical therapy, Visco supplementation, and cortisone injection. She has not done physical therapy in the past for her knees so I definitely think we need to start with that. I will refer her to [facility] for this. She would like to think about Visco supplementation versus cortisone. She will follow-up with me in one month for reevaluation or sooner if she decides that she would like to proceed with the cortisone injection.

## 2016-01-25 ENCOUNTER — Encounter: Payer: Self-pay | Admitting: *Deleted

## 2016-01-25 ENCOUNTER — Other Ambulatory Visit: Payer: Self-pay | Admitting: *Deleted

## 2016-01-25 DIAGNOSIS — M25561 Pain in right knee: Secondary | ICD-10-CM

## 2016-01-25 DIAGNOSIS — M25562 Pain in left knee: Principal | ICD-10-CM

## 2016-01-25 NOTE — Progress Notes (Signed)
Faxed notes to [place] at [facility] at [phone].  They will call and set the pt up for PT

## 2016-01-30 NOTE — Progress Notes (Signed)
Autumn Cruz D.O. [facility] [address] [place], [place] [address] Phone: [phone] Subjective:    I'm seeing this patient by the request  of:  Autumn Cruz,Autumn KOTVAN, MD  CC: Knee pain bilateral.   UXL:[id number]:Subjective  Autumn Cruz is a 40 y.o. female coming in with complaint of bilateral knee pain.  Patient states this is been a chronic problem. Has been treated by multiple different physicians down in [place] and has moved appear recently. Patient to the prone record since 2010. Has had osteoarthritic changes for multiple years and did respond well to the viscous supplementation in 2011. Patient states that the pain is mostly in the right knee at this point. Associated with intermittent swelling, worse with going up or down stairs. Has been trying over-the-counter anti-inflammatories with mild improvement. No recent trauma. Patient has never had surgery in the knees. Has done PT in the past.    patient did have x-rays recently. X-rays 01/23/2016 show mild osteophytic changes of the left knee as well as the right knee   Past Medical History:  Diagnosis Date  . DVT (deep venous thrombosis) (HCC)   . Gestational diabetes   . High cholesterol   . Hx of thrombosis 2014   Left lower extrm.  Marland Kitchen. Positive TB test   . Post-phlebitic syndrome 2014   Left  LE  . Thrombosis    Left leg preg tulip filter then removed.   . Tuberculosis    Positive test   Past Surgical History:  Procedure Laterality Date  . CESAREAN SECTION  2010 & 2012   Social History   Social History  . Marital status: Married    Spouse name: Autumn Cruz  . Number of children: 2  . Years of education: N/A   Occupational History  . Homemaker    Social History Main Topics  . Smoking status: Never Smoker  . Smokeless tobacco: Never Used  . Alcohol use No  . Drug use: No  . Sexual activity: Not Asked   Other Topics Concern  . None   Social History Narrative   Usually gets 6-7 hours of sleep per night     4 people living in the home.   Originally from [place] has a bachelor's degree some training in nursing and alternative medicine.   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   Negative tobacco alcohol some caffeine   Gravida 2 para 2 last Pap 2013   Think she had T. 2012   No Known Allergies Family History  Problem Relation Age of Onset  . Heart disease Mother     Blockage/Main artery  . Diabetes Mother   . Coronary artery disease Mother   . Hyperlipidemia Mother   . Hypertension Father   . Deep vein thrombosis Father     Past medical history, social, surgical and family history all reviewed in electronic medical record.  No pertanent information unless stated regarding to the chief complaint.   Review of Systems: No headache, visual changes, nausea, vomiting, diarrhea, constipation, dizziness, abdominal pain, skin rash, fevers, chills, night sweats, weight loss, swollen lymph nodes, body aches, joint swelling, muscle aches, chest pain, shortness of breath, mood changes.   Objective  Blood pressure 106/78, pulse 76, height 5' 5.5" (1.664 m), weight 202 lb (91.6 kg), SpO2 98 %.  General: No apparent distress alert and oriented x3 mood and  affect normal, dressed appropriately.  HEENT: Pupils equal, extraocular movements intact  Respiratory: Patient's speak in full sentences and does not appear short of breath  Cardiovascular: No lower extremity edema, non tender, no erythema  Skin: Warm dry intact with no signs of infection or rash on extremities or on axial skeleton.  Abdomen: Soft nontender  Neuro: Cranial nerves II through XII are intact, neurovascularly intact in all extremities with 2+ DTRs and 2+ pulses.  Lymph: No lymphadenopathy of posterior or anterior cervical chain or axillae bilaterally.  Gait Mild antalgic gait MSK:  Non tender with full range of motion and good  stability and symmetric strength and tone of shoulders, elbows, wrist, hip, and ankles bilaterally.  Knee:Bilateral Mild valgus deformity of the knees bilaterally. Tender to palpation on the medial joint line ROM full in flexion and extension and lower leg rotation. Ligaments with solid consistent endpoints including ACL, PCL, LCL, MCL. Negative Mcmurray's, Apley's, and Thessalonian tests. Severely painful patellar compression. Patellar glide with moderate crepitus. Patellar and quadriceps tendons unremarkable. Hamstring and quadriceps strength is normal.   After informed written and verbal consent, patient was seated on exam table. Right knee was prepped with alcohol swab and utilizing anterolateral approach, patient's right knee space was injected with 4:1  marcaine 0.5%: Kenalog 40mg /dL. Patient tolerated the procedure well without immediate complications. After informed written and verbal consent, patient was seated on exam table. Left knee was prepped with alcohol swab and utilizing anterolateral approach, patient's left knee space was injected with 4:1  marcaine 0.5%: Kenalog 40mg /dL. Patient tolerated the procedure well without immediate complications.   Impression and Recommendations:     This case required medical decision making of moderate complexity.      Note: This dictation was prepared with Dragon dictation along with smaller phrase technology. Any transcriptional errors that result from this process are unintentional.

## 2016-01-31 ENCOUNTER — Encounter: Payer: Self-pay | Admitting: Family Medicine

## 2016-01-31 ENCOUNTER — Ambulatory Visit (INDEPENDENT_AMBULATORY_CARE_PROVIDER_SITE_OTHER): Payer: 59 | Admitting: Family Medicine

## 2016-01-31 DIAGNOSIS — M170 Bilateral primary osteoarthritis of knee: Secondary | ICD-10-CM

## 2016-01-31 MED ORDER — DICLOFENAC SODIUM 2 % TD SOLN: 2.0000 "application " | Freq: Two times a day (BID) | TRANSDERMAL | 3 refills | Status: DC

## 2016-01-31 NOTE — Assessment & Plan Note (Signed)
Patient bilateral injections today. Has done all other conservative therapy. Encourage her to do the home exercises, icing protocol, which activities to do a regular basis. I do not believe that oral anti-inflammatories would be good with patient's past medical history. Given topical anti-inflammatories that I think will be better. We discussed continuing to be active. Encourage weight loss. Follow-up again in 4 weeks. At that time if worsening symptoms she could be a candidate for viscous supple mentation.

## 2016-01-31 NOTE — Patient Instructions (Addendum)
Good to see you  Ice 20 minutes 2 times daily. Usually after activity and before bed. Stay active pennsaid pinkie amount topically 2 times daily as needed.  Vitamin D 2000 Iu daily  Turmeric 500mg  daily  Tart cherry extract at night any dose.  Glucosamine 1500mg  daily  See me again in 2 weeks we can start synvisc.

## 2016-02-07 ENCOUNTER — Ambulatory Visit: Payer: 59 | Admitting: Sports Medicine

## 2016-02-13 ENCOUNTER — Ambulatory Visit (INDEPENDENT_AMBULATORY_CARE_PROVIDER_SITE_OTHER): Payer: 59 | Admitting: Sports Medicine

## 2016-02-13 ENCOUNTER — Encounter: Payer: Self-pay | Admitting: Sports Medicine

## 2016-02-13 VITALS — BP 127/70 | Ht 65.5 in | Wt 202.0 lb

## 2016-02-13 DIAGNOSIS — M170 Bilateral primary osteoarthritis of knee: Secondary | ICD-10-CM

## 2016-02-13 NOTE — Progress Notes (Signed)
   Subjective:    Patient ID: Autumn Cruz, female    DOB: Aug 28, 1975, 40 y.o.   MRN: [id number]  HPI   Patient comes in today for follow-up on bilateral knee pain. Patient saw Dr. Katrinka BlazingSmith on October 31. He injected her right knee with cortisone. As a result, her symptoms have improved. She has also started physical therapy. She has a follow-up appointment with Dr. Katrinka BlazingSmith later this week to discuss Visco supplementation.Recent x-rays of both knees showed mild degenerative changes.    Review of Systems    as above Objective:   Physical Exam  Examination of each knee shows full range of motion. No effusion. Good joint stability. Negative McMurray's. Neurovascularly intact distally.       Assessment & Plan:   Bilateral knee pain secondary to mild DJD  Since the patient has had a positive response to a recent cortisone injection as well as physical therapy, I've recommended that she consider holding off on Visco supplementation for the time being. She may want to follow-up with Dr. Katrinka BlazingSmith later this week as scheduled to discuss this further with him. She understands the importance of continuing with physical therapy until she is ready to wean to a home exercise program. Follow-up with me as needed.

## 2016-02-14 ENCOUNTER — Ambulatory Visit: Payer: 59 | Admitting: Family Medicine

## 2016-02-14 NOTE — Progress Notes (Signed)
Autumn Cruz D.O. [facility] [address] [place], [place] [address] Phone: [phone] Subjective:    I'm seeing this patient by the request  of:  Autumn Cruz,Autumn KOTVAN, MD  CC: Knee pain bilateral f/u  [id number]:Subjective  Autumn Cruz is a 40 y.o. female coming in with complaint of bilateral knee pain.  Patient states this is been a chronic problem. Patient was seen by me 2 weeks ago and was given an injection in the knees bilaterally for osteophytic changes. Patient was to do conservative therapy including topical anti-inflammatories and icing protocol. Patient states he was doing better at first but unfortunately feels like she is having some worsening pain. Did see another provider the day and was telling she was better. States that in certain circumstances she is better but still has the pain that is still affecting daily activities. Sometimes feels like they're going to give out.   patient did have x-rays recently. X-rays 01/23/2016 show mild osteophytic changes of the left knee as well as the right knee   Past Medical History:  Diagnosis Date  . DVT (deep venous thrombosis) (HCC)   . Gestational diabetes   . High cholesterol   . Hx of thrombosis 2014   Left lower extrm.  Marland Kitchen. Positive TB test   . Post-phlebitic syndrome 2014   Left  LE  . Thrombosis    Left leg preg tulip filter then removed.   . Tuberculosis    Positive test   Past Surgical History:  Procedure Laterality Date  . CESAREAN SECTION  2010 & 2012   Social History   Social History  . Marital status: Married    Spouse name: Autumn Cruz  . Number of children: 2  . Years of education: N/A   Occupational History  . Homemaker    Social History Main Topics  . Smoking status: Never Smoker  . Smokeless tobacco: Never Used  . Alcohol use No  . Drug use: No  . Sexual activity: Not Asked   Other Topics Concern  . None   Social History Narrative   Usually gets 6-7 hours of sleep per night   4 people  living in the home.   Originally from [place] has a bachelor's degree some training in nursing and alternative medicine.   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   Negative tobacco alcohol some caffeine   Gravida 2 para 2 last Pap 2013   Think she had T. 2012   No Known Allergies Family History  Problem Relation Age of Onset  . Heart disease Mother     Blockage/Main artery  . Diabetes Mother   . Coronary artery disease Mother   . Hyperlipidemia Mother   . Hypertension Father   . Deep vein thrombosis Father     Past medical history, social, surgical and family history all reviewed in electronic medical record.  No pertanent information unless stated regarding to the chief complaint.   Review of Systems: No headache, visual changes, nausea, vomiting, diarrhea, constipation, dizziness, abdominal pain, skin rash, fevers, chills, night sweats, weight loss, swollen lymph nodes, body aches, joint swelling, muscle aches, chest pain, shortness of breath, mood changes.   Objective  Blood pressure 114/72, pulse 78, height 5' 5.5" (1.664 m), weight 203 lb (92.1 kg), SpO2 97 %.  Systems examined below as of 02/15/16 General: NAD A&O x3 mood, affect  normal  HEENT: Pupils equal, extraocular movements intact no nystagmus Respiratory: not short of breath at rest or with speaking Cardiovascular: No lower extremity edema, non tender Skin: Warm dry intact with no signs of infection or rash on extremities or on axial skeleton. Abdomen: Soft nontender, no masses Neuro: Cranial nerves  intact, neurovascularly intact in all extremities with 2+ DTRs and 2+ pulses. Lymph: No lymphadenopathy appreciated today  Gait normal with good balance and coordination.  MSK: Non tender with full range of motion and good stability and symmetric strength and tone of shoulders, elbows, wrist, hips and ankles  bilaterally.   Knee:Bilateral Mild valgus deformity of the knees bilaterally. Tender to palpation on the medial joint line Still present ROM full in flexion and extension and lower leg rotation. Ligaments with solid consistent endpoints including ACL, PCL, LCL, MCL. Negative Mcmurray's, Apley's, and Thessalonian tests. Continued painful patellar compression. Patellar glide with moderate crepitus. Patellar and quadriceps tendons unremarkable. Hamstring and quadriceps strength is normal.  Minimal change  After informed written and verbal consent, patient was seated on exam table. Right knee was prepped with alcohol swab and utilizing anterolateral approach, patient's right knee space was injected with 16 mg/2.5 mL of Synvisc (sodium hyaluronate) in a prefilled syringe was injected easily into the knee through a 22-gauge needle. Patient tolerated the procedure well without immediate complications.  After informed written and verbal consent, patient was seated on exam table. Left knee was prepped with alcohol swab and utilizing anterolateral approach, patient's left knee space was injected with 16 mg/2.5 mL of Synvisc (sodium hyaluronate) in a prefilled syringe was injected easily into the knee through a 22-gauge needle.. Patient tolerated the procedure well without immediate complications.    Impression and Recommendations:     This case required medical decision making of moderate complexity.      Note: This dictation was prepared with Dragon dictation along with smaller phrase technology. Any transcriptional errors that result from this process are unintentional.

## 2016-02-15 ENCOUNTER — Ambulatory Visit: Payer: 59 | Admitting: Sports Medicine

## 2016-02-15 ENCOUNTER — Ambulatory Visit (INDEPENDENT_AMBULATORY_CARE_PROVIDER_SITE_OTHER): Payer: 59 | Admitting: Family Medicine

## 2016-02-15 ENCOUNTER — Encounter: Payer: Self-pay | Admitting: Family Medicine

## 2016-02-15 DIAGNOSIS — M170 Bilateral primary osteoarthritis of knee: Secondary | ICD-10-CM | POA: Diagnosis not present

## 2016-02-15 NOTE — Assessment & Plan Note (Addendum)
Patient is sent this is supplementation. We discussed icing regimen and home exercises. Patient will continue with the other conservative therapy. Did not make any significant improvement but was seemingly stable. Still affecting daily activities so we didn't go more aggressive at this time. Patient come back in 1 week for second in a series of 3 injections in the knees bilaterally. Spent  25 minutes with patient face-to-face and had greater than 50% of counseling including as described above in assessment and plan.

## 2016-02-15 NOTE — Patient Instructions (Signed)
Good to see you  We started the injections.  Ice is your friend  Keep doing everything else you are doing.  We will see you again next week or so for #2 out of 3.

## 2016-02-20 ENCOUNTER — Ambulatory Visit: Payer: 59 | Admitting: Sports Medicine

## 2016-02-21 ENCOUNTER — Ambulatory Visit: Payer: 59 | Admitting: Sports Medicine

## 2016-02-22 ENCOUNTER — Encounter: Payer: Self-pay | Admitting: Family Medicine

## 2016-02-22 ENCOUNTER — Ambulatory Visit (INDEPENDENT_AMBULATORY_CARE_PROVIDER_SITE_OTHER): Payer: 59 | Admitting: Family Medicine

## 2016-02-22 DIAGNOSIS — M170 Bilateral primary osteoarthritis of knee: Secondary | ICD-10-CM | POA: Diagnosis not present

## 2016-02-22 NOTE — Assessment & Plan Note (Signed)
Patient did have bilateral injections given today. 2 down in a series of 3. Encourage her to continue conservative therapy. Follow-up again in 1 week for third and final injections.

## 2016-02-22 NOTE — Patient Instructions (Signed)
Down one to go  Continue everything you are doing Have a great holiday! See you next week for the grand finale.

## 2016-02-22 NOTE — Progress Notes (Signed)
Tawana ScaleZach Smith D.O. [facility] [address] [place], [place] [address] Phone: [phone] Subjective:    I'm seeing this patient by the request  of:  Lorretta HarpPANOSH,WANDA KOTVAN, MD  CC: Knee pain bilateral f/u  [id number]:Subjective  Autumn Cruz is a 40 y.o. female coming in with complaint of bilateral knee pain.  Patient states this is been a chronic problem. Patient was found to have mild arthritic changes. Failed all other conservative therapy. Here for second in a series of 3 injections for viscous supplementation. Has noticed some improvement are ready.   patient did have x-rays recently. X-rays 01/23/2016 show mild osteophytic changes of the left knee as well as the right knee   Past Medical History:  Diagnosis Date  . DVT (deep venous thrombosis) (HCC)   . Gestational diabetes   . High cholesterol   . Hx of thrombosis 2014   Left lower extrm.  Marland Kitchen. Positive TB test   . Post-phlebitic syndrome 2014   Left  LE  . Thrombosis    Left leg preg tulip filter then removed.   . Tuberculosis    Positive test   Past Surgical History:  Procedure Laterality Date  . CESAREAN SECTION  2010 & 2012   Social History   Social History  . Marital status: Married    Spouse name: [name]  . Number of children: 2  . Years of education: N/A   Occupational History  . Homemaker    Social History Main Topics  . Smoking status: Never Smoker  . Smokeless tobacco: Never Used  . Alcohol use No  . Drug use: No  . Sexual activity: Not Asked   Other Topics Concern  . None   Social History Narrative   Usually gets 6-7 hours of sleep per night   4 people living in the home.   Originally from [place] has a bachelor's degree some training in nursing and alternative medicine.   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   Negative tobacco alcohol some caffeine   Gravida 2 para 2 last  Pap 2013   Think she had T. 2012   No Known Allergies Family History  Problem Relation Age of Onset  . Heart disease Mother     Blockage/Main artery  . Diabetes Mother   . Coronary artery disease Mother   . Hyperlipidemia Mother   . Hypertension Father   . Deep vein thrombosis Father     Past medical history, social, surgical and family history all reviewed in electronic medical record.  No pertanent information unless stated regarding to the chief complaint.   Review of Systems: No headache, visual changes, nausea, vomiting, diarrhea, constipation, dizziness, abdominal pain, skin rash, fevers, chills, night sweats, weight loss, swollen lymph nodes, body aches, joint swelling, muscle aches, chest pain, shortness of breath, mood changes.   Objective  Blood pressure 116/80, pulse 96, height 5\' 5"  (1.651 m), weight 203 lb 6.4 oz (92.3 kg).  Systems examined below as of 02/22/16 General: NAD A&O x3 mood, affect normal  HEENT: Pupils equal, extraocular movements intact no nystagmus Respiratory: not short of breath at rest or with speaking Cardiovascular: No lower extremity edema, non tender Skin: Warm dry intact with no signs of infection or rash on extremities or on axial skeleton. Abdomen: Soft nontender, no masses Neuro: Cranial nerves  intact, neurovascularly intact in all extremities  with 2+ DTRs and 2+ pulses. Lymph: No lymphadenopathy appreciated today  Gait normal with good balance and coordination.  MSK: Non tender with full range of motion and good stability and symmetric strength and tone of shoulders, elbows, wrist, hips and ankles bilaterally.   Knee:Bilateral Mild valgus deformity of the knees bilaterally. Tender to palpation on the medial joint line Still present ROM full in flexion and extension and lower leg rotation. Ligaments with solid consistent endpoints including ACL, PCL, LCL, MCL. Negative Mcmurray's, Apley's, and Thessalonian tests. Continued painful  patellar compression. Patellar glide with moderate crepitus. Patellar and quadriceps tendons unremarkable. Hamstring and quadriceps strength is normal.  Minimal change from previous exam   After informed written and verbal consent, patient was seated on exam table. Right knee was prepped with alcohol swab and utilizing anterolateral approach, patient's right knee space was injected with 16 mg/2.5 mL of Synvisc (sodium hyaluronate) in a prefilled syringe was injected easily into the knee through a 22-gauge needle.. Patient tolerated the procedure well without immediate complications.  After informed written and verbal consent, patient was seated on exam table. Left knee was prepped with alcohol swab and utilizing anterolateral approach, patient's left knee space was injected with 16 mg/2.5 mL of Synvisc (sodium hyaluronate) in a prefilled syringe was injected easily into the knee through a 22-gauge needle.. Patient tolerated the procedure well without immediate complications.    Impression and Recommendations:     This case required medical decision making of moderate complexity.      Note: This dictation was prepared with Dragon dictation along with smaller phrase technology. Any transcriptional errors that result from this process are unintentional.

## 2016-03-04 NOTE — Progress Notes (Addendum)
Tawana ScaleZach Tityana Pagan D.O. [facility] [address] [place], [place] [address] Phone: [phone] Subjective:    I'm seeing this patient by the request  of:  Lorretta HarpPANOSH,WANDA KOTVAN, MD  CC: Knee pain bilateral f/u  BJY:[id number]:Subjective  Autumn Cruz is a 40 y.o. female coming in with complaint of bilateral knee pain.  Patient states this is been a chronic problem. Patient was found to have mild arthritic changes. Failed all other conservative therapy. Third in a series of 3 injections for the knees. Patient states She is feeling significantly better but then in physical therapy started having worsening symptoms again. Patient stopped going at this time. States that she had 2 days where she was very difficult to even ambulate. Patient states sleeping well at that time. Has not notice any new symptoms just was worsening of previous symptoms.   patient did have x-rays recently. X-rays 01/23/2016 show mild osteophytic changes of the left knee as well as the right knee   Past Medical History:  Diagnosis Date  . DVT (deep venous thrombosis) (HCC)   . Gestational diabetes   . High cholesterol   . Hx of thrombosis 2014   Left lower extrm.  Marland Kitchen. Positive TB test   . Post-phlebitic syndrome 2014   Left  LE  . Thrombosis    Left leg preg tulip filter then removed.   . Tuberculosis    Positive test   Past Surgical History:  Procedure Laterality Date  . CESAREAN SECTION  2010 & 2012   Social History   Social History  . Marital status: Married    Spouse name: [name]  . Number of children: 2  . Years of education: N/A   Occupational History  . Homemaker    Social History Main Topics  . Smoking status: Never Smoker  . Smokeless tobacco: Never Used  . Alcohol use No  . Drug use: No  . Sexual activity: Not Asked   Other Topics Concern  . None   Social History Narrative   Usually gets 6-7 hours of sleep per night   4 people living in the home.   Originally from [place] has a  bachelor's degree some training in nursing and alternative medicine.   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   Negative tobacco alcohol some caffeine   Gravida 2 para 2 last Pap 2013   Think she had T. 2012   No Known Allergies Family History  Problem Relation Age of Onset  . Heart disease Mother     Blockage/Main artery  . Diabetes Mother   . Coronary artery disease Mother   . Hyperlipidemia Mother   . Hypertension Father   . Deep vein thrombosis Father     Past medical history, social, surgical and family history all reviewed in electronic medical record.  No pertanent information unless stated regarding to the chief complaint.   Review of Systems: No headache, visual changes, nausea, vomiting, diarrhea, constipation, dizziness, abdominal pain, skin rash, fevers, chills, night sweats, weight loss, swollen lymph nodes, body aches, joint swelling, muscle aches, chest pain, shortness of breath, mood changes.   Objective  Blood pressure 118/80, pulse 72, height 5\' 5"  (1.651 m), weight 203 lb (92.1 kg), SpO2 98 %.  Systems examined below as of 03/05/16 General: NAD A&O x3 mood, affect normal  HEENT: Pupils equal, extraocular movements intact no nystagmus  Respiratory: not short of breath at rest or with speaking Cardiovascular: No lower extremity edema, non tender Skin: Warm dry intact with no signs of infection or rash on extremities or on axial skeleton. Abdomen: Soft nontender, no masses Neuro: Cranial nerves  intact, neurovascularly intact in all extremities with 2+ DTRs and 2+ pulses. Lymph: No lymphadenopathy appreciated today  Gait Mild antalgic gait  MSK: Non tender with full range of motion and good stability and symmetric strength and tone of shoulders, elbows, wrist,  hips and ankles bilaterally.  No significant change from previous exam  Knee:Bilateral Mild  valgus deformity of the knees bilaterally. Tender to palpation on the medial joint line Still present ROM full in flexion and extension and lower leg rotation. Ligaments with solid consistent endpoints including ACL, PCL, LCL, MCL. Negative Mcmurray's, Apley's, and Thessalonian tests. Continued painful patellar compression. Patellar glide with moderate crepitus. Patellar and quadriceps tendons unremarkable. Hamstring and quadriceps strength is normal.  No change from previous exam   After informed written and verbal consent, patient was seated on exam table. Right knee was prepped with alcohol swab and utilizing anterolateral approach, patient's right knee space was injected with 16 mg/2.5 mL of Synvisc (sodium hyaluronate) in a prefilled syringe was injected easily into the knee through a 22-gauge needle. Patient tolerated the procedure well without immediate complications.  After informed written and verbal consent, patient was seated on exam table. Left knee was prepped with alcohol swab and utilizing anterolateral approach, patient's left knee space was injected with 16 mg/2.5 mL of Synvisc (sodium hyaluronate) in a prefilled syringe was injected easily into the knee through a 22-gauge needle.. Patient tolerated the procedure well without immediate complications.    Impression and Recommendations:     This case required medical decision making of moderate complexity.      Note: This dictation was prepared with Dragon dictation along with smaller phrase technology. Any transcriptional errors that result from this process are unintentional.

## 2016-03-05 ENCOUNTER — Encounter: Payer: Self-pay | Admitting: Family Medicine

## 2016-03-05 ENCOUNTER — Ambulatory Visit (INDEPENDENT_AMBULATORY_CARE_PROVIDER_SITE_OTHER): Payer: 59 | Admitting: Family Medicine

## 2016-03-05 DIAGNOSIS — M170 Bilateral primary osteoarthritis of knee: Secondary | ICD-10-CM

## 2016-03-05 NOTE — Patient Instructions (Addendum)
Good to see you Autumn Cruz is your friend.  You get a break from me at this time.  See me again in 4 weeks to see how you are doing!  Happy holidays!

## 2016-03-05 NOTE — Assessment & Plan Note (Signed)
Bilateral Synvisc series have been completed today. Continue conservative therapy. Follow-up again in 4 weeks

## 2016-04-03 ENCOUNTER — Ambulatory Visit: Payer: 59 | Admitting: Family Medicine

## 2016-04-04 ENCOUNTER — Telehealth: Payer: Self-pay | Admitting: *Deleted

## 2016-04-04 NOTE — Telephone Encounter (Signed)
-----   Message from Lizbeth BarkMelanie L Ceresi sent at 04/04/2016 10:35 AM EST ----- Regarding: FW: phone message Pt called stating she is in a lot of a pain is asking for a call back. She wants to talk to you before scheduling an appt. ----- Message ----- From: Lizbeth BarkMelanie L Ceresi Sent: 04/03/2016  10:48 AM To: Frankey Poothea N Katai Marsico, RN Subject: phone message                                  Patient wants to discuss pain/swelling she is having in her rt knee. She has had 4-5 PT sessions.

## 2016-04-04 NOTE — Telephone Encounter (Signed)
Spoke with patient about her knee pain.  While she was in [place] over the holidays she had an MRI performed there which she said noted a meniscal tear.  I said for her to bring those results and images to her appt scheduled with Dr. Katrinka BlazingSmith next week and his office will determine next plan of care.

## 2016-04-05 ENCOUNTER — Telehealth: Payer: Self-pay | Admitting: Internal Medicine

## 2016-04-05 NOTE — Telephone Encounter (Signed)
Pt called in and said they she had an MRI in [place].  She wants to drop of cd for Dr Katrinka Blazingsmith to look at before her appt on Monday.  Can you call her

## 2016-04-05 NOTE — Telephone Encounter (Signed)
lmovm letting pt know she can drop CD off if she would like.

## 2016-04-07 NOTE — Progress Notes (Signed)
Autumn Cruz [address] [place], [place] [address] Phone: [phone] Subjective:       CC: bilateral knee pain.  GLO:[id number]  Autumn Cruz is a 41 y.o. female coming in with complaint of bilateral knee pain. Patient was given viscous supplementation and finished that almost 6 weeks ago. Patient seems to be doing better. When out of the country and started having worsening pain. Will patient was out of the country did have an MRI. Patient brings disc today. Patient states Still having the pain. Patient states though that since she has been back in the country she has been feeling better. Still having pain only in the right knee. Feels of the left knee is completely better at this time.   reviewing patient's MRI. MRI did show the patient did have a posterior degenerative meniscal tear laterally as well as medial  Past Medical History:  Diagnosis Date  . DVT (deep venous thrombosis) (HCC)   . Gestational diabetes   . High cholesterol   . Hx of thrombosis 2014   Left lower extrm.  Marland Kitchen Positive TB test   . Post-phlebitic syndrome 2014   Left  LE  . Thrombosis    Left leg preg tulip filter then removed.   . Tuberculosis    Positive test   Past Surgical History:  Procedure Laterality Date  . CESAREAN SECTION  2010 & 2012   Social History   Social History  . Marital status: Married    Spouse name: Autumn Cruz  . Number of children: 2  . Years of education: N/A   Occupational History  . Homemaker    Social History Main Topics  . Smoking status: Never Smoker  . Smokeless tobacco: Never Used  . Alcohol use No  . Drug use: No  . Sexual activity: Not on file   Other Topics Concern  . Not on file   Social History Narrative   Usually gets 6-7 hours of sleep per night   4 people living in the home.   Originally from [place] has a bachelor's degree some training in nursing and alternative [place].   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   Negative tobacco alcohol some caffeine   Gravida 2 para 2 last Pap 2013   Think she had T. 2012   No Known Allergies Family History  Problem Relation Age of Onset  . Heart disease Mother     Blockage/Main artery  . Diabetes Mother   . Coronary artery disease Mother   . Hyperlipidemia Mother   . Hypertension Father   . Deep vein thrombosis Father     Past medical history, social, surgical and family history all reviewed in electronic medical record.  No pertanent information unless stated regarding to the chief complaint.   Review of Systems:Review of systems updated and as accurate as of 04/09/16  No headache, visual changes, nausea, vomiting, diarrhea, constipation, dizziness, abdominal pain, skin rash, fevers, chills, night sweats, weight loss, swollen lymph nodes, body aches, joint swelling, muscle aches, chest pain, shortness of breath, mood changes.   Objective  Blood pressure 118/82, pulse 74, height 5' 5.5" (1.664 m), weight 203 lb (92.1 kg). Systems examined below as of 04/09/16   General: No apparent distress alert and oriented x3 mood and affect normal, dressed appropriately.  HEENT: Pupils equal, extraocular movements intact  Respiratory: Patient's speak in full sentences and does not appear short of breath  Cardiovascular: No lower extremity edema, non tender, no erythema  Skin: Warm dry intact with no signs of infection or rash on extremities or on axial skeleton.  Abdomen: Soft nontender  Neuro: Cranial nerves II through XII are intact, neurovascularly intact in all extremities with 2+ DTRs and 2+ pulses.  Lymph: No lymphadenopathy of posterior or anterior cervical chain or axillae bilaterally.  Gait normal with good balance and coordination.  MSK:  Non tender with full range of motion and good stability and symmetric strength and tone of shoulders, elbows,  wrist, hip, and ankles bilaterally.  Knee:bilateral  Normal to inspection with no erythema or effusion or obvious bony abnormalities. TTP on the medial joint line. Marland Kitchen. ROM full in flexion and extension and lower leg rotation. Ligaments with solid consistent endpoints including ACL, PCL, LCL, MCL. Negative Mcmurray's, Apley's, and Thessalonian tests. Non painful patellar compression. Patellar glide with mild crepitus. Patellar and quadriceps tendons unremarkable. Hamstring and quadriceps strength is normal.     Impression and Recommendations:     This case required medical decision making of moderate complexity.      Note: This dictation was prepared with Dragon dictation along with smaller phrase technology. Any transcriptional errors that result from this process are unintentional.

## 2016-04-09 ENCOUNTER — Encounter: Payer: Self-pay | Admitting: Family Medicine

## 2016-04-09 ENCOUNTER — Ambulatory Visit (INDEPENDENT_AMBULATORY_CARE_PROVIDER_SITE_OTHER): Payer: 59 | Admitting: Family Medicine

## 2016-04-09 DIAGNOSIS — M23203 Derangement of unspecified medial meniscus due to old tear or injury, right knee: Secondary | ICD-10-CM | POA: Diagnosis not present

## 2016-04-09 NOTE — Patient Instructions (Signed)
Good to see you  Ice is your friend  Dr. Luiz BlareGraves office will be calling you  You keep doing your exercises See me again if you have questions or if we need to repeat injections.

## 2016-04-09 NOTE — Assessment & Plan Note (Signed)
This is diagnosed on MRI. Discussed with patient at great length. With degenerative meniscal tears discussed different options. Patient has been doing formal physical therapy, home exercises and has found both steroid injection as well as viscous supplementation. Patient like to be referred to an orthopedic surgeon to know her other options. We discussed in great length about other treatment options. Patient will come back again if we can help r decide another injection.  Spent  25 minutes with patient face-to-face and had greater than 50% of counseling including as described above in assessment and plan.

## 2016-04-17 ENCOUNTER — Telehealth: Payer: Self-pay | Admitting: Family Medicine

## 2016-04-17 DIAGNOSIS — M23203 Derangement of unspecified medial meniscus due to old tear or injury, right knee: Secondary | ICD-10-CM

## 2016-04-17 NOTE — Telephone Encounter (Signed)
Pt is scheduled 1.23.18 @ 12:30pm. Pt made aware.

## 2016-04-17 NOTE — Telephone Encounter (Signed)
Patient is checking on Surgeon referral.

## 2016-04-24 DIAGNOSIS — M25562 Pain in left knee: Secondary | ICD-10-CM | POA: Diagnosis not present

## 2016-04-24 DIAGNOSIS — M25561 Pain in right knee: Secondary | ICD-10-CM | POA: Diagnosis not present

## 2016-05-14 DIAGNOSIS — M25561 Pain in right knee: Secondary | ICD-10-CM | POA: Diagnosis not present

## 2016-05-21 ENCOUNTER — Encounter: Payer: Self-pay | Admitting: *Deleted

## 2016-05-21 ENCOUNTER — Telehealth: Payer: Self-pay | Admitting: *Deleted

## 2016-05-21 DIAGNOSIS — H5213 Myopia, bilateral: Secondary | ICD-10-CM | POA: Diagnosis not present

## 2016-05-21 NOTE — Telephone Encounter (Signed)
OK by me to try.

## 2016-05-21 NOTE — Telephone Encounter (Signed)
Rec'd call stating needing to get a letter stating that she need to take the water fitness class it would beneficial to the medical meniscus tear that she has. By having this letter she can use her saving card to pay for the class...Raechel Chute/lmb

## 2016-05-21 NOTE — Telephone Encounter (Signed)
Called patient to let her know letter is at front desk for her to pick up.

## 2016-05-28 NOTE — Progress Notes (Signed)
[facility] [address] [place], [place] [address] Phone: [phone] Subjective:       CC: bilateral knee pain f/u  BJY:[id number]  Autumn Cruz is a 41 y.o. female coming in with complaint of bilateral knee pain. Patient was given viscous supplementation. She did not have any significant improvement and didn't have the degenerative meniscal tear. Patient's on the provider for possible surgical intervention. Patient states She did not go through with the surgical intervention. Was given another injection. This was 6 weeks ago. Patient states that the pain is starting worsening in. Would like to potentially try another injection and avoiding certain activities. Patient is going to start doing more water aerobic she states. Patient is more motivated to lose weight and wants to see if she can get this better otherwise.   reviewing patient's MRI. MRI did show the patient did have a posterior degenerative meniscal tear laterally as well as medial  Past Medical History:  Diagnosis Date  . DVT (deep venous thrombosis) (HCC)   . Gestational diabetes   . High cholesterol   . Hx of thrombosis 2014   Left lower extrm.  Marland Kitchen Positive TB test   . Post-phlebitic syndrome 2014   Left  LE  . Thrombosis    Left leg preg tulip filter then removed.   . Tuberculosis    Positive test   Past Surgical History:  Procedure Laterality Date  . CESAREAN SECTION  2010 & 2012   Social History   Social History  . Marital status: Married    Spouse name: [name]  . Number of children: 2  . Years of education: N/A   Occupational History  . Homemaker    Social History Main Topics  . Smoking status: Never Smoker  . Smokeless tobacco: Never Used  . Alcohol use No  . Drug use: No  . Sexual activity: Not on file   Other Topics Concern  . Not on file   Social History Narrative   Usually gets 6-7 hours of sleep per night   4 people living in the home.   Originally  from [place] has a bachelor's degree some training in nursing and alternative medicine.   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   Negative tobacco alcohol some caffeine   Gravida 2 para 2 last Pap 2013   Think she had T. 2012   No Known Allergies Family History  Problem Relation Age of Onset  . Heart disease Mother     Blockage/Main artery  . Diabetes Mother   . Coronary artery disease Mother   . Hyperlipidemia Mother   . Hypertension Father   . Deep vein thrombosis Father     Past medical history, social, surgical and family history all reviewed in electronic medical record.  No pertanent information unless stated regarding to the chief complaint.   Review of Systems: No headache, visual changes, nausea, vomiting, diarrhea, constipation, dizziness, abdominal pain, skin rash, fevers, chills, night sweats, weight loss, swollen lymph nodes, body aches, joint swelling, muscle aches, chest pain, shortness of breath, mood changes.    Objective  There were no vitals taken for this visit. Systems examined below as of 05/28/16   Systems examined below as of 05/29/16 General: NAD A&O x3 mood, affect normal  HEENT: Pupils equal, extraocular movements intact no nystagmus Respiratory: not short of  breath at rest or with speaking Cardiovascular: No lower extremity edema, non tender Skin: Warm dry intact with no signs of infection or rash on extremities or on axial skeleton. Abdomen: Soft nontender, no masses Neuro: Cranial nerves  intact, neurovascularly intact in all extremities with 2+ DTRs and 2+ pulses. Lymph: No lymphadenopathy appreciated today  Gait normal with good balance and coordination.  MSK: Non tender with full range of motion and good stability and symmetric strength and tone of shoulders, elbows, wrist,  hips and ankles bilaterally.    Knee: Right Normal to  inspection with no erythema or effusion or obvious bony abnormalities. TTP on the medial joint line. Marland Kitchen. ROM full in flexion and extension and lower leg rotation. Ligaments with solid consistent endpoints including ACL, PCL, LCL, MCL. Positive Mcmurray's, Apley's, and Thessalonian tests. Positive painful patellar compression. Patellar glide with mild crepitus. Patellar and quadriceps tendons unremarkable. Hamstring and quadriceps strength is normal.     After informed written and verbal consent, patient was seated on exam table. Right knee was prepped with alcohol swab and utilizing anterolateral approach, patient's right knee space was injected with 4:1  marcaine 0.5%: Kenalog 40mg /dL. Patient tolerated the procedure well without immediate complications. Impression and Recommendations:     This case required medical decision making of moderate complexity.      Note: This dictation was prepared with Dragon dictation along with smaller phrase technology. Any transcriptional errors that result from this process are unintentional.

## 2016-05-29 ENCOUNTER — Ambulatory Visit (INDEPENDENT_AMBULATORY_CARE_PROVIDER_SITE_OTHER): Payer: 59 | Admitting: Family Medicine

## 2016-05-29 ENCOUNTER — Encounter: Payer: Self-pay | Admitting: Family Medicine

## 2016-05-29 DIAGNOSIS — M23203 Derangement of unspecified medial meniscus due to old tear or injury, right knee: Secondary | ICD-10-CM

## 2016-05-29 NOTE — Patient Instructions (Signed)
Good to see you  Gustavus Bryantce is your friend.  Get in the water Weight loss would be great  We tried another injection; Think about the PRP  You know where I am if you need me.

## 2016-05-29 NOTE — Assessment & Plan Note (Addendum)
Worsening symptoms. Patient given another injection today. Discussed with patient at great length. We discussed other potential treatment options. Patient understood the risks of doing steroid injections in close succession. Patient states that at this time and is what she wants to do patient states that if any worsening symptoms she needs consider surgical intervention. 15 consider also possible Synvisc intervention if necessary again. Encourage her to weight loss. Follow-up as needed.

## 2016-05-30 DIAGNOSIS — H35711 Central serous chorioretinopathy, right eye: Secondary | ICD-10-CM | POA: Diagnosis not present

## 2016-06-06 DIAGNOSIS — L430 Hypertrophic lichen planus: Secondary | ICD-10-CM | POA: Diagnosis not present

## 2016-06-15 DIAGNOSIS — Z1231 Encounter for screening mammogram for malignant neoplasm of breast: Secondary | ICD-10-CM | POA: Diagnosis not present

## 2016-07-04 ENCOUNTER — Ambulatory Visit: Payer: 59 | Admitting: Family Medicine

## 2016-07-10 ENCOUNTER — Encounter: Payer: Self-pay | Admitting: Family Medicine

## 2016-07-10 ENCOUNTER — Ambulatory Visit: Payer: Self-pay

## 2016-07-10 ENCOUNTER — Ambulatory Visit (INDEPENDENT_AMBULATORY_CARE_PROVIDER_SITE_OTHER): Payer: 59 | Admitting: Family Medicine

## 2016-07-10 VITALS — BP 104/70 | HR 88 | Resp 16 | Wt 201.8 lb

## 2016-07-10 DIAGNOSIS — M23203 Derangement of unspecified medial meniscus due to old tear or injury, right knee: Secondary | ICD-10-CM

## 2016-07-10 DIAGNOSIS — M25561 Pain in right knee: Secondary | ICD-10-CM

## 2016-07-10 NOTE — Progress Notes (Signed)
Pre-visit discussion using our clinic review tool. No additional management support is needed unless otherwise documented below in the visit note.  

## 2016-07-10 NOTE — Patient Instructions (Signed)
Good to see you  Please follow the chart Try no icing or ibuprofen for at least 48 hours.  Increase activity as it states on the sheet  See me again in 3 weeks.

## 2016-07-10 NOTE — Progress Notes (Signed)
Autumn Cruz, Autumn Cruz Phone: [phone] Subjective:       CC: bilateral knee pain f/u  [id number]  Autumn Cruz is a 41 y.o. female coming in with complaint of bilateral knee pain. Patient was given viscous supplementation. She did not have any significant improvement and didn't have the degenerative meniscal tear. Patient's on the provider for possible surgical intervention. Patient states She did not go through with the surgical intervention. Patient wanted to exhaust all other conservative therapy. Was given an injection back in February. Patient decided that she would like to try a PRP injection.   reviewing patient's MRI. MRI did show the patient did have a posterior degenerative meniscal tear laterally as well as medial  Past Medical History:  Diagnosis Date  . DVT (deep venous thrombosis) (HCC)   . Gestational diabetes   . High cholesterol   . Hx of thrombosis 2014   Left lower extrm.  Marland Kitchen Positive TB test   . Post-phlebitic syndrome 2014   Left  LE  . Thrombosis    Left leg preg tulip filter then removed.   . Tuberculosis    Positive test   Past Surgical History:  Procedure Laterality Date  . CESAREAN SECTION  2010 & 2012   Social History   Social History  . Marital status: Married    Spouse name: [name]  . Number of children: 2  . Years of education: N/A   Occupational History  . Homemaker    Social History Main Topics  . Smoking status: Never Smoker  . Smokeless tobacco: Never Used  . Alcohol use No  . Drug use: No  . Sexual activity: Not Asked   Other Topics Concern  . None   Social History Narrative   Usually gets 6-7 hours of sleep per night   4 people living in the home.   Originally from [place] has a bachelor's degree some training in nursing and alternative [place].   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   Negative tobacco alcohol some caffeine   Gravida 2 para 2 last Pap 2013   Think she had T. 2012   No Known Allergies Family History  Problem Relation Age of Onset  . Heart disease Mother     Blockage/Main artery  . Diabetes Mother   . Coronary artery disease Mother   . Hyperlipidemia Mother   . Hypertension Father   . Deep vein thrombosis Father     Past medical history, social, surgical and family history all reviewed in electronic medical record.  No pertanent information unless stated regarding to the chief complaint.   Review of Systems: No headache, visual changes, nausea, vomiting, diarrhea, constipation, dizziness, abdominal pain, skin rash, fevers, chills, night sweats, weight loss, swollen lymph nodes, body aches, joint swelling, muscle aches, chest pain, shortness of breath, mood changes.    Objective  Blood pressure 104/70, pulse 88, resp. rate 16, weight 201 lb 12 oz (91.5 kg), SpO2 95 %.   Systems examined below as of 07/10/16 General: NAD A&O x3 mood, affect normal  HEENT: Pupils equal, extraocular movements intact no nystagmus Respiratory: not short of breath at rest or with speaking Cardiovascular: No lower extremity edema, non tender Skin: Warm dry intact with no signs of infection or rash on extremities or on axial skeleton. Abdomen:  Soft nontender, no masses Neuro: Cranial nerves  intact, neurovascularly intact in all extremities with 2+ DTRs and 2+ pulses. Lymph: No lymphadenopathy appreciated today  Gait normal with good balance and coordination.  MSK: Non tender with full range of motion and good stability and symmetric strength and tone of shoulders, elbows, wrist,  hips and ankles bilaterally.   Knee: Right Normal to inspection with no erythema or effusion or obvious bony abnormalities. TTP on the medial joint line. Marland Kitchen ROM full in flexion and extension and lower leg rotation. Ligaments with solid consistent  endpoints including ACL, PCL, LCL, MCL. Positive Mcmurray's, Apley's, and Thessalonian tests Still noted Positive painful patellar compression. Patellar glide with mild crepitus. Patellar and quadriceps tendons unremarkable. Hamstring and quadriceps strength is normal.  Contralateral knee unremarkable    After informed written and verbal consent, patient was seated on exam table. Right knee was prepped with alcohol swab and utilizing anterolateral approach, patient's right knee space was injected with 2 mL of 0.5% Marcaine and then 3 mL of pre-centrifuged PRP. Patient tolerated the procedure well without immediate complications. Impression and Recommendations:     This case required medical decision making of moderate complexity.      Note: This dictation was prepared with Dragon dictation along with smaller phrase technology. Any transcriptional errors that result from this process are unintentional.

## 2016-07-10 NOTE — Assessment & Plan Note (Signed)
Patient given PRP injection today. Tolerated the procedure well. Patient's is going to do the return to sports protocol. Patient will do this over the course the next 6 weeks. Patient went over home exercises today. Patient wants also get aquatic therapy at home. We will attempt to do this. Follow-up again with me in 3 weeks

## 2016-07-11 ENCOUNTER — Telehealth: Payer: Self-pay | Admitting: Internal Medicine

## 2016-07-11 NOTE — Telephone Encounter (Signed)
Spoke with patient. Told her that she is likely having an inflammatory reaction to the injection that should calm down by the end of the week. Told patient she can use tylenol and put compression on knee for any swelling she is having. She asked if she would be ok to walk around the zoo and I explained that we want her to minimize physical activity in her routine for one week and if she could get a scooter she would be ok to go to the zoo. I also recommend that she perform the hamstring and quad stretch to help with increasing range of motion in her knee post-injection. Patient voices understanding.

## 2016-07-11 NOTE — Telephone Encounter (Signed)
Pt had injection yesterday in knee, Pt states she is in pain, states she cannot bend her knee. Wants to know when it will go away.

## 2016-07-16 ENCOUNTER — Encounter: Payer: Self-pay | Admitting: Oncology

## 2016-07-16 ENCOUNTER — Ambulatory Visit (INDEPENDENT_AMBULATORY_CARE_PROVIDER_SITE_OTHER): Payer: 59 | Admitting: Oncology

## 2016-07-16 VITALS — BP 112/60 | HR 72 | Temp 97.6°F | Ht 64.0 in | Wt 205.5 lb

## 2016-07-16 DIAGNOSIS — Z86718 Personal history of other venous thrombosis and embolism: Secondary | ICD-10-CM | POA: Diagnosis not present

## 2016-07-16 DIAGNOSIS — D688 Other specified coagulation defects: Secondary | ICD-10-CM | POA: Diagnosis not present

## 2016-07-16 DIAGNOSIS — S83206D Unspecified tear of unspecified meniscus, current injury, right knee, subsequent encounter: Secondary | ICD-10-CM | POA: Diagnosis not present

## 2016-07-16 DIAGNOSIS — Z6835 Body mass index (BMI) 35.0-35.9, adult: Secondary | ICD-10-CM | POA: Diagnosis not present

## 2016-07-16 DIAGNOSIS — D6851 Activated protein C resistance: Secondary | ICD-10-CM | POA: Diagnosis not present

## 2016-07-16 DIAGNOSIS — Z8672 Personal history of thrombophlebitis: Secondary | ICD-10-CM | POA: Diagnosis not present

## 2016-07-16 DIAGNOSIS — D6859 Other primary thrombophilia: Secondary | ICD-10-CM

## 2016-07-16 DIAGNOSIS — Z7901 Long term (current) use of anticoagulants: Secondary | ICD-10-CM

## 2016-07-16 DIAGNOSIS — X58XXXD Exposure to other specified factors, subsequent encounter: Secondary | ICD-10-CM | POA: Diagnosis not present

## 2016-07-16 DIAGNOSIS — E669 Obesity, unspecified: Secondary | ICD-10-CM

## 2016-07-16 NOTE — Patient Instructions (Signed)
Return visit 1 year 

## 2016-07-16 NOTE — Progress Notes (Signed)
Hematology and Oncology Follow Up Visit  Autumn Cruz [id number] 1975/08/20 40 y.o. 07/16/2016 10:11 AM   Principle Diagnosis: Encounter Diagnoses  Name Primary?  . Factor V Leiden mutation (HCC) Yes  . Hypercoagulable state (HCC)   . Long term (current) use of anticoagulants   Clinical summary: Pleasant 41 year old woman originally from [place] who I first saw for an office consultation   in August 2014 when she moved here from [place]. She had a history of a left pelvic DVT at week 24 of her first pregnancy in 2010. She was subsequently found to be a heterozygote for the factor V Leiden gene mutation and a homozygote for the PAI-1 gene. She was fully anticoagulated during her second pregnancy in 2012 and had no complications. She was subsequently put on long-term prophylactic Lovenox 40 mg daily. She had symptoms of a mild, chronic, postphlebitic syndrome with intermittent swelling and discomfort of her left lower extremity. I did not feel that she needed to be on long-term anticoagulation. I did not think that the PAI-1 gene polymorphism contributed in a significant way to her thrombotic events. I recommended that she use low-dose aspirin prophylaxis.  I saw her for an unscheduled visit on 08/03/2014 with an approximate 6 week history of increasing discomfort of her left thigh and calf primarily when she was standing. She had mild left calf tenderness on exam but no significant discrepancy in the circumference of the calf or ankle compare with the right side. D-dimer in normal range at 0.39. Vascular ultrasound of the lower extremity did not show any recurrent clot. Findings consistent with a mild postphlebitic syndrome. Symptoms resolved on their own. I did initially recommend chronic low-dose aspirin as thromboprophylaxis.  Interim History:   She has been having some orthopedic problems with her right knee.  She had a torn meniscus.  She is trying to avoid any surgery.  She received initial  steroid injections and then was using topical steroids and Voltaren gel.  Recently she had a injection of platelet rich plasma?  By a Dr. Katrinka Blazing, sports medicine.  This did give her some relief. She has had some intermittent calf pain but nothing persistent or progressive. She denies any dyspnea, chest pain, palpitations. Her children are growing up fast.  Her son is 8 her daughter is 6.  She is not considering having any additional children.  Her menstrual cycles are still regular. 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  Medications: reviewed  Allergies: No Known Allergies  Review of Systems: See interim history Remaining ROS negative:   Physical Exam: Blood pressure 112/60, pulse 72, temperature 97.6 F (36.4 C), temperature source Oral, height  (1.626 m), weight 205 lb 8 oz (93.2 kg), SpO2 100 %. Wt Readings from Last 3 Encounters:  07/16/16 205 lb 8 oz (93.2 kg)  07/10/16 201 lb 12 oz (91.5 kg)  05/29/16 202 lb (91.6 kg)     General appearance: Overweight [place] woman BMI 35 HENNT: Pharynx no erythema, exudate, mass, or ulcer. No thyromegaly or thyroid nodules Lymph nodes: No cervical, supraclavicular, or axillary lymphadenopathy Breasts:  Lungs: Clear to auscultation, resonant to percussion throughout Heart: Regular rhythm, no murmur, no gallop, no rub, no click, no edema Abdomen: Soft, nontender, normal bowel sounds, no mass, no organomegaly Extremities: No edema, no calf tenderness Musculoskeletal: no joint deformities Left calf 47 cm, right calf 45 both nontender. GU:  Vascular: Carotid pulses 2+, Neurologic: Alert, oriented, PERRLA,  cranial nerves grossly normal, motor strength 5 over 5,  upper body coordination normal, gait normal, Skin: No rash or ecchymosis  Lab Results: CBC W/Diff    Component Value Date/Time   WBC 7.0 12/20/2015 0831   RBC 4.24 12/20/2015 0831   HGB 13.3 12/20/2015 0831    HGB 13.2 11/05/2012 1103   HCT 38.8 12/20/2015 0831   HCT 40.2 11/05/2012 1103   PLT 196.0 12/20/2015 0831   PLT 221 11/05/2012 1103   MCV 91.5 12/20/2015 0831   MCV 93.1 11/05/2012 1103   MCH 30.6 11/05/2012 1103   MCHC 34.1 12/20/2015 0831   RDW 12.2 12/20/2015 0831   RDW 12.4 11/05/2012 1103   LYMPHSABS 2.7 12/20/2015 0831   LYMPHSABS 2.9 11/05/2012 1103   MONOABS 0.4 12/20/2015 0831   MONOABS 0.5 11/05/2012 1103   EOSABS 0.1 12/20/2015 0831   EOSABS 0.1 11/05/2012 1103   BASOSABS 0.0 12/20/2015 0831   BASOSABS 0.0 11/05/2012 1103     Chemistry      Component Value Date/Time   NA 139 12/20/2015 0831   NA 140 11/05/2012 1104   K 3.9 12/20/2015 0831   K 3.6 11/05/2012 1104   CL 104 12/20/2015 0831   CO2 29 12/20/2015 0831   CO2 26 11/05/2012 1104   BUN 9 12/20/2015 0831   BUN 10.7 11/05/2012 1104   CREATININE 0.81 12/20/2015 0831   CREATININE 0.8 11/05/2012 1104      Component Value Date/Time   CALCIUM 8.8 12/20/2015 0831   CALCIUM 9.4 11/05/2012 1104   ALKPHOS 59 12/20/2015 0831   ALKPHOS 72 11/05/2012 1104   AST 19 12/20/2015 0831   AST 14 11/05/2012 1104   ALT 22 12/20/2015 0831   ALT 20 11/05/2012 1104   BILITOT 1.0 12/20/2015 0831   BILITOT 0.87 11/05/2012 1104       Radiological Studies: [place] Limited Joint Space Structures Low Right  Result Date: 07/15/2016 No images stored.    Impression:  #1. Coagulopathy secondary to factor V Leiden heterozygote status with possibly some contribution from PAI-1 mutation. We discussed ongoing use of low-dose aspirin.  Although we know aspirin reduces venous thrombosis by about 30%, no prospective studies have been done to evaluate aspirin prophylaxis after provoked DVTs related to factor V Leiden deficiency.  I told her I did not feel strongly that she needed to continue the aspirin.  However now that she is having orthopedic problems with her knee, nonsteroidal anti-inflammatory or aspirin may be helpful to relieve  symptoms. She will still need thromboprophylaxis if she travels any long distances such as going back to [place].  #2. Proximal pelvic thrombosis during first pregnancy secondary to #1. Likely a May Thurner syndrome.  #3.  History of mild, intermittent, post phlebitis syndrome secondary to #2. Currently stable with no active phlebitis.  CC: Patient Care Team: Madelin Headings, MD as PCP - General (Internal Medicine) Levert Feinstein, MD as Consulting Physician (Oncology) Sherren Kerns, MD as Consulting Physician (Vascular Surgery) Carrington Clamp, MD as Consulting Physician (Obstetrics and Gynecology)   Cephas Darby, MD, FACP  Hematology-Oncology/Internal Medicine     4/16/201810:11 AM

## 2016-08-02 ENCOUNTER — Ambulatory Visit: Payer: 59 | Admitting: Family Medicine

## 2016-08-20 NOTE — Progress Notes (Signed)
[name] [address] [place], [place] [address] Phone: [phone] Subjective:    I'm seeing this patient by the request  of:    CC: Right knee pain follow-up  [id number]  Autumn Cruz is a 41 y.o. female coming in with complaint of right knee pain. It is unknown arthritic changes as well as meniscal degenerative changes. This is been diagnosed with advanced imaging. Patient has had viscous supplementation previously. Patient was having worsening pain of the right knee and was given PRP injections. This is 3 weeks ago. Has started to protocol. Patient states  Doing approximately 70% better. Patient states when the regular daily activities very minimal pain at all. Patient states that she has noticed increasing and muscle strength as well. Patient feels like she is making significant progress. More than she has in multiple months.     Past Medical History:  Diagnosis Date  . DVT (deep venous thrombosis) (HCC)   . Gestational diabetes   . High cholesterol   . Hx of thrombosis 2014   Left lower extrm.  Marland Kitchen Positive TB test   . Post-phlebitic syndrome 2014   Left  LE  . Thrombosis    Left leg preg tulip filter then removed.   . Tuberculosis    Positive test   Past Surgical History:  Procedure Laterality Date  . CESAREAN SECTION  2010 & 2012   Social History   Social History  . Marital status: Married    Spouse name: [name]  . Number of children: 2  . Years of education: N/A   Occupational History  . Homemaker    Social History Main Topics  . Smoking status: Never Smoker  . Smokeless tobacco: Never Used  . Alcohol use No  . Drug use: No  . Sexual activity: Not on file   Other Topics Concern  . Not on file   Social History Narrative   Usually gets 6-7 hours of sleep per night   4 people living in the home.   Originally from [place] has a bachelor's degree some training in nursing and alternative medicine.   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   Negative tobacco alcohol some caffeine   Gravida 2 para 2 last Pap 2013   Think she had T. 2012   No Known Allergies Family History  Problem Relation Age of Onset  . Heart disease Mother        Blockage/Main artery  . Diabetes Mother   . Coronary artery disease Mother   . Hyperlipidemia Mother   . Hypertension Father   . Deep vein thrombosis Father     Past medical history, social, surgical and family history all reviewed in electronic medical record.  No pertanent information unless stated regarding to the chief complaint.   Review of Systems: No headache, visual changes, nausea, vomiting, diarrhea, constipation, dizziness, abdominal pain, skin rash, fevers, chills, night sweats, weight loss, swollen lymph nodes, body aches, joint swelling, muscle aches, chest pain, shortness of breath, mood changes.    Objective  There were no vitals taken for this visit. Systems examined below as of 08/20/16   Systems examined below as of 08/21/16 General: NAD A&O x3 mood, affect normal  HEENT: Pupils equal, extraocular movements intact no nystagmus Respiratory: not short of breath at rest or with speaking Cardiovascular: No lower extremity  edema, non tender Skin: Warm dry intact with no signs of infection or rash on extremities or on axial skeleton. Abdomen: Soft nontender, no masses Neuro: Cranial nerves  intact, neurovascularly intact in all extremities with 2+ DTRs and 2+ pulses. Lymph: No lymphadenopathy appreciated today  Gait normal with good balance and coordination.  MSK: Non tender with full range of motion and good stability and symmetric strength and tone of shoulders, elbows, wrist,  hips and ankles bilaterally.   Knee: Right  Normal to inspection with no erythema or effusion or obvious bony abnormalities. Palpation normal with no warmth, joint  line tenderness, patellar tenderness, or condyle tenderness. ROM full in flexion and extension and lower leg rotation. Ligaments with solid consistent endpoints including ACL, PCL, LCL, MCL. Mild positive Mcmurray's, Apley's, and Thessalonian tests. Non painful patellar compression. Patellar glide without crepitus. Patellar and quadriceps tendons unremarkable. Hamstring and quadriceps strength is normal.  Contralateral knee unremarkable.   Impression and Recommendations:     This case required medical decision making of moderate complexity.      Note: This dictation was prepared with Dragon dictation along with smaller phrase technology. Any transcriptional errors that result from this process are unintentional.

## 2016-08-21 ENCOUNTER — Ambulatory Visit (INDEPENDENT_AMBULATORY_CARE_PROVIDER_SITE_OTHER): Payer: 59 | Admitting: Family Medicine

## 2016-08-21 ENCOUNTER — Encounter: Payer: Self-pay | Admitting: Family Medicine

## 2016-08-21 DIAGNOSIS — M23203 Derangement of unspecified medial meniscus due to old tear or injury, right knee: Secondary | ICD-10-CM

## 2016-08-21 NOTE — Assessment & Plan Note (Signed)
Intent in PRP. Doing somewhat better. I do think that the underlying arthritis likely contributed to patient having recurrent pain at some point but as long as patient does well with conservative therapy will continue. Discusses the possibility of following up again in 6-8 weeks. Continue

## 2016-08-29 ENCOUNTER — Other Ambulatory Visit: Payer: Self-pay | Admitting: Obstetrics and Gynecology

## 2016-08-29 DIAGNOSIS — Z01419 Encounter for gynecological examination (general) (routine) without abnormal findings: Secondary | ICD-10-CM | POA: Diagnosis not present

## 2016-08-29 DIAGNOSIS — Z8632 Personal history of gestational diabetes: Secondary | ICD-10-CM | POA: Diagnosis not present

## 2016-08-29 DIAGNOSIS — Z124 Encounter for screening for malignant neoplasm of cervix: Secondary | ICD-10-CM | POA: Diagnosis not present

## 2016-08-29 LAB — CBC AND DIFFERENTIAL
HEMATOCRIT: 43 % (ref 36–46)
Hemoglobin: 14.3 g/dL (ref 12.0–16.0)
PLATELETS: 225 10*3/uL (ref 150–399)
WBC: 7.1 10*3/mL

## 2016-08-29 LAB — LIPID PANEL
CHOLESTEROL: 230 mg/dL — AB (ref 0–200)
HDL: 44 mg/dL (ref 35–70)
LDL Cholesterol: 170 mg/dL
Triglycerides: 79 mg/dL (ref 40–160)

## 2016-08-29 LAB — HEPATIC FUNCTION PANEL
ALT: 17 U/L (ref 7–35)
AST: 14 U/L (ref 13–35)
Alkaline Phosphatase: 59 U/L (ref 25–125)
Bilirubin, Total: 1.2 mg/dL

## 2016-08-29 LAB — HEMOGLOBIN A1C: Hemoglobin A1C: 5.3

## 2016-08-29 LAB — TSH: TSH: 0.57 u[IU]/mL (ref 0.41–5.90)

## 2016-08-29 LAB — BASIC METABOLIC PANEL
BUN: 9 mg/dL (ref 4–21)
Creatinine: 0.9 mg/dL (ref 0.5–1.1)
Glucose: 101 mg/dL
Potassium: 4.2 mmol/L (ref 3.4–5.3)
Sodium: 138 mmol/L (ref 137–147)

## 2016-08-31 LAB — CYTOLOGY - PAP

## 2016-09-03 ENCOUNTER — Encounter: Payer: Self-pay | Admitting: Family Medicine

## 2016-11-26 ENCOUNTER — Telehealth: Payer: Self-pay | Admitting: Family Medicine

## 2016-11-26 NOTE — Telephone Encounter (Signed)
Patient states Dr. Katrinka Blazing was treating her right knee.  Patient states now her left knee is acting up.  Patient is requesting an order to be placed for an xray before an appt with Dr. Katrinka Blazing.  I have patient scheduled for 9/6 with Dr. Katrinka Blazing.  Patient is requesting to be worked in sooner if possible on a day before 2pm.

## 2016-11-26 NOTE — Telephone Encounter (Signed)
Lets put her in one of the openings on Friday

## 2016-11-27 ENCOUNTER — Encounter: Payer: Self-pay | Admitting: Internal Medicine

## 2016-11-27 ENCOUNTER — Ambulatory Visit (INDEPENDENT_AMBULATORY_CARE_PROVIDER_SITE_OTHER): Payer: 59 | Admitting: Internal Medicine

## 2016-11-27 VITALS — BP 106/74 | HR 91 | Temp 98.0°F | Wt 207.6 lb

## 2016-11-27 DIAGNOSIS — R200 Anesthesia of skin: Secondary | ICD-10-CM

## 2016-11-27 DIAGNOSIS — Z86718 Personal history of other venous thrombosis and embolism: Secondary | ICD-10-CM

## 2016-11-27 DIAGNOSIS — R29898 Other symptoms and signs involving the musculoskeletal system: Secondary | ICD-10-CM | POA: Diagnosis not present

## 2016-11-27 DIAGNOSIS — M549 Dorsalgia, unspecified: Secondary | ICD-10-CM

## 2016-11-27 DIAGNOSIS — D6851 Activated protein C resistance: Secondary | ICD-10-CM | POA: Diagnosis not present

## 2016-11-27 DIAGNOSIS — M436 Torticollis: Secondary | ICD-10-CM | POA: Diagnosis not present

## 2016-11-27 DIAGNOSIS — D6859 Other primary thrombophilia: Secondary | ICD-10-CM

## 2016-11-27 DIAGNOSIS — R29818 Other symptoms and signs involving the nervous system: Secondary | ICD-10-CM

## 2016-11-27 DIAGNOSIS — Z8249 Family history of ischemic heart disease and other diseases of the circulatory system: Secondary | ICD-10-CM | POA: Diagnosis not present

## 2016-11-27 NOTE — Patient Instructions (Addendum)
Keep appt with  Cardiology  But this acts more like   Radicular sx from the spine  c spine  Consider ation  Of neck mri.  And Chest x ray   Sometimes we add  Prednisone to see if   Radicular pain gets better .     Cervical Radiculopathy Cervical radiculopathy happens when a nerve in the neck (cervical nerve) is pinched or bruised. This condition can develop because of an injury or as part of the normal aging process. Pressure on the cervical nerves can cause pain or numbness that runs from the neck all the way down into the arm and fingers. Usually, this condition gets better with rest. Treatment may be needed if the condition does not improve. What are the causes? This condition may be caused by:  Injury.  Slipped (herniated) disk.  Muscle tightness in the neck because of overuse.  Arthritis.  Breakdown or degeneration in the bones and joints of the spine (spondylosis) due to aging.  Bone spurs that may develop near the cervical nerves.  What are the signs or symptoms? Symptoms of this condition include:  Pain that runs from the neck to the arm and hand. The pain can be severe or irritating. It may be worse when the neck is moved.  Numbness or weakness in the affected arm and hand.  How is this diagnosed? This condition may be diagnosed based on symptoms, medical history, and a physical exam. You may also have tests, including:  X-rays.  CT scan.  MRI.  Electromyogram (EMG).  Nerve conduction tests.  How is this treated? In many cases, treatment is not needed for this condition. With rest, the condition usually gets better over time. If treatment is needed, options may include:  Wearing a soft neck collar for short periods of time.  Physical therapy to strengthen your neck muscles.  Medicines, such as NSAIDs, oral corticosteroids, or spinal injections.  Surgery. This may be needed if other treatments do not help. Various types of surgery may be done depending on  the cause of your problems.  Follow these instructions at home: Managing pain  Take over-the-counter and prescription medicines only as told by your health care provider.  If directed, apply ice to the affected area. ? Put ice in a plastic bag. ? Place a towel between your skin and the bag. ? Leave the ice on for 20 minutes, 2-3 times per day.  If ice does not help, you can try using heat. Take a warm shower or warm bath, or use a heat pack as told by your health care provider.  Try a gentle neck and shoulder massage to help relieve symptoms. Activity  Rest as needed. Follow instructions from your health care provider about any restrictions on activities.  Do stretching and strengthening exercises as told by your health care provider or physical therapist. General instructions  If you were given a soft collar, wear it as told by your health care provider.  Use a flat pillow when you sleep.  Keep all follow-up visits as told by your health care provider. This is important. Contact a health care provider if:  Your condition does not improve with treatment. Get help right away if:  Your pain gets much worse and cannot be controlled with medicines.  You have weakness or numbness in your hand, arm, face, or leg.  You have a high fever.  You have a stiff, rigid neck.  You lose control of your bowels or your  bladder (have incontinence).  You have trouble with walking, balance, or speaking. This information is not intended to replace advice given to you by your health care provider. Make sure you discuss any questions you have with your health care provider. Document Released: 12/12/2000 Document Revised: 08/25/2015 Document Reviewed: 05/13/2014 Elsevier Interactive Patient Education  2018 Elsevier Inc.     Radicular Pain Radicular pain is a type of pain that spreads from your back or neck along a spinal nerve. Spinal nerves are nerves that leave the spinal cord and go to  the muscles. Radicular pain occurs when one of these nerves becomes irritated or squeezed (compressed). Radicular pain is sometimes called radiculopathy, radiculitis, or a pinched nerve. When you have this type of pain, you may also have weakness, numbness, or tingling in the area of your body that is supplied by the nerve. The pain may feel sharp and burning. Spinal nerves leave the spinal cord through openings between the 24 bones (vertebrae) that make up the spine. Radicular pain is often caused by something pushing on a spinal nerve. This pushing may be done by a vertebra or by one of the round cushions between vertebrae (intervertebral disks). This can result from an injury, from wear and tear or aging of a disk, or from the growth of a bone spur that pushes on the nerve. Radicular pain can occur in various areas depending on which spinal nerve is affected:  Cervical radicular pain occurs in the neck. You may also feel pain, numbness, weakness, or tingling in the arms.  Thoracic radicular pain occurs in the mid-spine area. You would feel this pain in the back and chest. This type is rare.  Lumbar radicular pain occurs in the lower back area. You would feel this pain as low back pain. You may feel pain, numbness, weakness, or tingling in the buttocks or legs. Sciatica is a type of lumbar radicular pain that shoots down the back of the leg.  Radicular pain often goes away when you follow instructions from your health care provider for relieving pain at home. Follow these instructions at home: Managing pain  If directed, apply ice to the affected area: ? Put ice in a plastic bag. ? Place a towel between your skin and the bag. ? Leave the ice on for 20 minutes, 2-3 times a day.  If directed, apply heat to the affected area as often as told by your health care provider. Use the heat source that your health care provider recommends, such as a moist heat pack or a heating pad. ? Place a towel  between your skin and the heat source. ? Leave the heat on for 20-30 minutes. ? Remove the heat if your skin turns bright red. This is especially important if you are unable to feel pain, heat, or cold. You may have a greater risk of getting burned. Activity   Do not sit or rest in bed for long periods of time.  Try to stay as active as possible. Ask your health care provider what type of exercise or activity is best for you.  Avoid activities that make your pain worse, such as bending and lifting.  Do not lift anything that is heavier than 10 lb (4.5 kg). Practice using proper technique when lifting items. Proper lifting technique involves bending your knees and rising up.  Do strength and range-of-motion exercises only as told by your health care provider. General instructions  Take over-the-counter and prescription medicines only as told by  your health care provider.  Pay attention to any changes in your symptoms.  Keep all follow-up visits as told by your health care provider. This is important. Contact a health care provider if:  Your pain and other symptoms get worse.  Your pain medicine is not helping.  Your pain has not improved after a few weeks of home care.  You have a fever. Get help right away if:  You have severe pain, weakness, or numbness.  You have difficulty with bladder or bowel control. This information is not intended to replace advice given to you by your health care provider. Make sure you discuss any questions you have with your health care provider. Document Released: 04/26/2004 Document Revised: 08/25/2015 Document Reviewed: 10/13/2014 Elsevier Interactive Patient Education  [employer].

## 2016-11-27 NOTE — Telephone Encounter (Signed)
Pt is scheduled for Thursday 11/29/2016.

## 2016-11-27 NOTE — Progress Notes (Signed)
Chief Complaint  Patient presents with  . Acute Visit    HNG:ITJLLVD about heart attack and stroke.  Autumn Cruz 41 y.o. come in for SDA    Because "body heavy on left"   Has hx of thrombosis  And no prn high risk lovenoix.    For the last 4-5 days she has been feeling fairly continuous numb feeling on her left side of her neck to her head pressure feeling to the eyeball area and feels uncomfortable she has numbness in her hand and a feeling of weakness. This did not come on suddenly. Tries to massage the hand to feel better left arm is somewhat worse when she lays down at night. She also some pain in her left upper back trapezius area No anterior chest pain feels somewhat short of breath but no acute findings no vomiting nausea or diaphoresis. Because of her family history or personal history of high risk for clotting she was worried about heart attack or stroke. She does have an appointment with Dr. Garth Bigness tomorrow cardiovascular specialist.  There is no falling no trauma uncertain no acute problems with her vision but some issues of focusing. No dysarthria hearing changes other acute neurologic symptoms. feft arm worse when lay down at night .  weaker  Feels heavy on left side.    Is left handed.    5 days now conituous numbness  Not a lot of pain  As much as stiffness and numbness left neck area.  .  ROS: See pertinent positives and negatives per HPI. o swelling  No trama hx of same .  fam hx ischemic disease   Past Medical History:  Diagnosis Date  . DVT (deep venous thrombosis) (HCC)   . Gestational diabetes   . High cholesterol   . Hx of thrombosis 2014   Left lower extrm.  Marland Kitchen Positive TB test   . Post-phlebitic syndrome 2014   Left  LE  . Thrombosis    Left leg preg tulip filter then removed.   . Tuberculosis    Positive test    Family History  Problem Relation Age of Onset  . Heart disease Mother        Blockage/Main artery  . Diabetes Mother   . Coronary artery  disease Mother   . Hyperlipidemia Mother   . Hypertension Father   . Deep vein thrombosis Father     Social History   Social History  . Marital status: Married    Spouse name: [name]  . Number of children: 2  . Years of education: N/A   Occupational History  . Homemaker    Social History Main Topics  . Smoking status: Never Smoker  . Smokeless tobacco: Never Used  . Alcohol use No  . Drug use: No  . Sexual activity: Not Asked   Other Topics Concern  . None   Social History Narrative   Usually gets 6-7 hours of sleep per night   4 people living in the home.   Originally from [place] has a bachelor's degree some training in nursing and alternative medicine.   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   Negative tobacco alcohol some caffeine   Gravida 2 para 2 last Pap 2013   Think she had T. 2012    Outpatient Medications Prior to Visit  Medication Sig Dispense Refill  .  aspirin 81 MG tablet Take 81 mg by mouth daily.    . Diclofenac Sodium (PENNSAID) 2 % SOLN Place 2 application onto the skin 2 (two) times daily. 112 g 3  . Multiple Vitamin (MULTI-VITAMIN PO) Take by mouth.    . tretinoin (RETIN-A) 0.05 % cream APPLY 1 APPLICATION APPLY ON THE SKIN AT BEDTIME START AT 3-4 DAYS A WEEK  2   No facility-administered medications prior to visit.      EXAM:  BP 106/74 (BP Location: Right Arm, Patient Position: Sitting, Cuff Size: Large)   Pulse 91   Temp 98 F (36.7 C) (Oral)   Wt 207 lb 9.6 oz (94.2 kg)   BMI 34.02 kg/m   Body mass index is 34.02 kg/m.  GENERAL: vitals reviewed and listed above, alert, oriented, appears well hydrated and in no acute distress mild worry  Nl gait   HEENT: atraumatic, conjunctiva  clear, no obvious abnormalities on inspection of external nose and ears eoms nl tmx clear face symmetrical OP : no lesion edema or exudate  tonge midline  NECK: no obvious masses on inspection palpation   Tight trap no mass  [place] area I s nl  LUNGS: clear to auscultation bilaterally, no wheezes, rales or rhonchi, good air movement CV: HRRR, no clubbing cyanosis or  peripheral edema nl cap refill  MS: moves all extremities without noticeable focal  Abnormality  Le arm strength lefft slgihtly dec from right  But still 5/5    Cn 2-12 look intact  dtrs non pathologic  No rash  Gait steady   PSYCH: pleasant and cooperative, no obvious depression or anxiety Lab Results  Component Value Date   WBC 7.1 08/29/2016   HGB 14.3 08/29/2016   HCT 43 08/29/2016   PLT 225 08/29/2016   GLUCOSE 92 12/20/2015   CHOL 230 (A) 08/29/2016   TRIG 79 08/29/2016   HDL 44 08/29/2016   LDLDIRECT 200.7 02/04/2013   LDLCALC 170 08/29/2016   ALT 17 08/29/2016   AST 14 08/29/2016   NA 138 08/29/2016   K 4.2 08/29/2016   CL 104 12/20/2015   CREATININE 0.9 08/29/2016   BUN 9 08/29/2016   CO2 29 12/20/2015   TSH 0.57 08/29/2016   HGBA1C 5.3 08/29/2016   BP Readings from Last 3 Encounters:  11/27/16 106/74  08/21/16 110/80  07/16/16 112/60  EKG nsr nad  Nl intervals   ASSESSMENT AND PLAN:  Discussed the following assessment and plan:  Left arm numbness - Plan: EKG 12-Lead, DG Chest 2 View, MR Cervical Spine Wo Contrast  Upper back pain on left side - Plan: EKG 12-Lead, DG Chest 2 View, MR Cervical Spine Wo Contrast  Hx of thrombosis of lower extremity in pregnancy  Hypercoagulable state (HCC)  Family hx of ischem heart dis and oth dis of the circ sys  Neck stiffness - Plan: MR Cervical Spine Wo Contrast  Left arm weakness very subtle - Plan: MR Cervical Spine Wo Contrast  Other symptoms and signs involving the nervous system - Plan: MR Cervical Spine Wo Contrast, MR Brain W Wo Contrast  Factor V Leiden mutation (HCC) overallsuspect  radiculopathy or other neurologic finding. But would keep the appointment with cardiology for  cardiovascular risk assessment another opinion. Discussed options of evaluation because of some mild weakness and her risk continuous unable to take prednisone plan MRI of the neck and head. She will get a chest x-ray today also to look at the left upper chest where her  pain is posteriorly.  We discussed using prednisone empirically but she had a side effect of some retinal edema swelling and vision change when she was on some form of steroid in the past so will avoid at this time. -Patient advised to return or notify health care team  if  new concerns arise. Total visit > 50% spent counseling and coordinating care as indicated in above note and in instructions to patient .  And ordering plans     Patient Instructions  Keep appt with  Cardiology  But this acts more like   Radicular sx from the spine  c spine  Consider ation  Of neck mri.  And Chest x ray   Sometimes we add  Prednisone to see if   Radicular pain gets better .     Cervical Radiculopathy Cervical radiculopathy happens when a nerve in the neck (cervical nerve) is pinched or bruised. This condition can develop because of an injury or as part of the normal aging process. Pressure on the cervical nerves can cause pain or numbness that runs from the neck all the way down into the arm and fingers. Usually, this condition gets better with rest. Treatment may be needed if the condition does not improve. What are the causes? This condition may be caused by:  Injury.  Slipped (herniated) disk.  Muscle tightness in the neck because of overuse.  Arthritis.  Breakdown or degeneration in the bones and joints of the spine (spondylosis) due to aging.  Bone spurs that may develop near the cervical nerves.  What are the signs or symptoms? Symptoms of this condition include:  Pain that runs from the neck to the arm and hand. The pain can be severe or irritating. It may be worse when the neck is moved.  Numbness or weakness in  the affected arm and hand.  How is this diagnosed? This condition may be diagnosed based on symptoms, medical history, and a physical exam. You may also have tests, including:  X-rays.  CT scan.  MRI.  Electromyogram (EMG).  Nerve conduction tests.  How is this treated? In many cases, treatment is not needed for this condition. With rest, the condition usually gets better over time. If treatment is needed, options may include:  Wearing a soft neck collar for short periods of time.  Physical therapy to strengthen your neck muscles.  Medicines, such as NSAIDs, oral corticosteroids, or spinal injections.  Surgery. This may be needed if other treatments do not help. Various types of surgery may be done depending on the cause of your problems.  Follow these instructions at home: Managing pain  Take over-the-counter and prescription medicines only as told by your health care provider.  If directed, apply ice to the affected area. ? Put ice in a plastic bag. ? Place a towel between your skin and the bag. ? Leave the ice on for 20 minutes, 2-3 times per day.  If ice does not help, you can try using heat. Take a warm shower or warm bath, or use a heat pack as told by your health care provider.  Try a gentle neck and shoulder massage to help relieve symptoms. Activity  Rest as needed. Follow instructions from your health care provider about any restrictions on activities.  Do stretching and strengthening exercises as told by your health care provider or physical therapist. General instructions  If you were given a soft collar, wear it as told by your health care provider.  Use  a flat pillow when you sleep.  Keep all follow-up visits as told by your health care provider. This is important. Contact a health care provider if:  Your condition does not improve with treatment. Get help right away if:  Your pain gets much worse and cannot be controlled with medicines.  You  have weakness or numbness in your hand, arm, face, or leg.  You have a high fever.  You have a stiff, rigid neck.  You lose control of your bowels or your bladder (have incontinence).  You have trouble with walking, balance, or speaking. This information is not intended to replace advice given to you by your health care provider. Make sure you discuss any questions you have with your health care provider. Document Released: 12/12/2000 Document Revised: 08/25/2015 Document Reviewed: 05/13/2014 Elsevier Interactive Patient Education  2018 Elsevier Inc.     Radicular Pain Radicular pain is a type of pain that spreads from your back or neck along a spinal nerve. Spinal nerves are nerves that leave the spinal cord and go to the muscles. Radicular pain occurs when one of these nerves becomes irritated or squeezed (compressed). Radicular pain is sometimes called radiculopathy, radiculitis, or a pinched nerve. When you have this type of pain, you may also have weakness, numbness, or tingling in the area of your body that is supplied by the nerve. The pain may feel sharp and burning. Spinal nerves leave the spinal cord through openings between the 24 bones (vertebrae) that make up the spine. Radicular pain is often caused by something pushing on a spinal nerve. This pushing may be done by a vertebra or by one of the round cushions between vertebrae (intervertebral disks). This can result from an injury, from wear and tear or aging of a disk, or from the growth of a bone spur that pushes on the nerve. Radicular pain can occur in various areas depending on which spinal nerve is affected:  Cervical radicular pain occurs in the neck. You may also feel pain, numbness, weakness, or tingling in the arms.  Thoracic radicular pain occurs in the mid-spine area. You would feel this pain in the back and chest. This type is rare.  Lumbar radicular pain occurs in the lower back area. You would feel this pain as  low back pain. You may feel pain, numbness, weakness, or tingling in the buttocks or legs. Sciatica is a type of lumbar radicular pain that shoots down the back of the leg.  Radicular pain often goes away when you follow instructions from your health care provider for relieving pain at home. Follow these instructions at home: Managing pain  If directed, apply ice to the affected area: ? Put ice in a plastic bag. ? Place a towel between your skin and the bag. ? Leave the ice on for 20 minutes, 2-3 times a day.  If directed, apply heat to the affected area as often as told by your health care provider. Use the heat source that your health care provider recommends, such as a moist heat pack or a heating pad. ? Place a towel between your skin and the heat source. ? Leave the heat on for 20-30 minutes. ? Remove the heat if your skin turns bright red. This is especially important if you are unable to feel pain, heat, or cold. You may have a greater risk of getting burned. Activity   Do not sit or rest in bed for long periods of time.  Try to stay as  active as possible. Ask your health care provider what type of exercise or activity is best for you.  Avoid activities that make your pain worse, such as bending and lifting.  Do not lift anything that is heavier than 10 lb (4.5 kg). Practice using proper technique when lifting items. Proper lifting technique involves bending your knees and rising up.  Do strength and range-of-motion exercises only as told by your health care provider. General instructions  Take over-the-counter and prescription medicines only as told by your health care provider.  Pay attention to any changes in your symptoms.  Keep all follow-up visits as told by your health care provider. This is important. Contact a health care provider if:  Your pain and other symptoms get worse.  Your pain medicine is not helping.  Your pain has not improved after a few weeks of  home care.  You have a fever. Get help right away if:  You have severe pain, weakness, or numbness.  You have difficulty with bladder or bowel control. This information is not intended to replace advice given to you by your health care provider. Make sure you discuss any questions you have with your health care provider. Document Released: 04/26/2004 Document Revised: 08/25/2015 Document Reviewed: 10/13/2014 Elsevier Interactive Patient Education  2018 [employer].     [place]. Cosette Prindle M.D.

## 2016-11-28 DIAGNOSIS — Z0189 Encounter for other specified special examinations: Secondary | ICD-10-CM | POA: Diagnosis not present

## 2016-11-28 DIAGNOSIS — M792 Neuralgia and neuritis, unspecified: Secondary | ICD-10-CM | POA: Diagnosis not present

## 2016-11-28 DIAGNOSIS — D6851 Activated protein C resistance: Secondary | ICD-10-CM | POA: Diagnosis not present

## 2016-11-29 ENCOUNTER — Other Ambulatory Visit: Payer: Self-pay | Admitting: Emergency Medicine

## 2016-11-29 ENCOUNTER — Telehealth: Payer: Self-pay | Admitting: Internal Medicine

## 2016-11-29 NOTE — Telephone Encounter (Signed)
Pt state that she has 2 concerns and would like to see if something could be done about her MRI because she is in a lot of pain.  1. Not able to do the MRI for another two weeks and would like to have it done sooner.  2. Pt need to have a open MRI done due to her being very claustrophobic

## 2016-11-29 NOTE — Telephone Encounter (Signed)
Pt is calling back stated that she has researched places to have a open MRI at [facility] on Wednesday 12/05/16 or if Dr. Fabian SharpPanosh know of another place [facility]([place] that can do it Friday 11/30/16 or Tuesday 12/04/16.

## 2016-11-29 NOTE — Telephone Encounter (Signed)
Ok to order open mri and  Pre medicate with lorazepam 1 mg pre imaging   Autumn Cruz is wherever can get in   asap  Please move up urgency of    Scan

## 2016-11-29 NOTE — Progress Notes (Signed)
Tawana ScaleZach Waylynn Benefiel D.O. [facility] [address] [place], [place] [address] Phone: [phone] Subjective:    I'm seeing this patient by the request  of:    CC: Left knee pain.  LKG:[id number]:Subjective  Autumn Cruz is a 41 y.o. female coming in with complaint of left knee pain. Known mild osteophytic changes. Right knee and did respond fairly well to PRP injection previously. Patient states left knee giving her more difficulty. Seems to be anterior. More of a grinding sensation. Worse with going up and down stairs. Feels that the right knee is still doing significantly better though. Very similar to her previous presentation.   patient also was seen by primary care provider because of some left arm weakness. Patient is awaiting cervical spine and head MRI. Patient also had a chest x-ray in no masses appreciated. This was independently visualized by me. Patient was given the opportunity for an prednisone orally but patient declined it secondary to having difficulty.  Past Medical History:  Diagnosis Date  . DVT (deep venous thrombosis) (HCC)   . Gestational diabetes   . High cholesterol   . Hx of thrombosis 2014   Left lower extrm.  Marland Kitchen. Positive TB test   . Post-phlebitic syndrome 2014   Left  LE  . Thrombosis    Left leg preg tulip filter then removed.   . Tuberculosis    Positive test   Past Surgical History:  Procedure Laterality Date  . CESAREAN SECTION  2010 & 2012   Social History   Social History  . Marital status: Married    Spouse name: [name]  . Number of children: 2  . Years of education: N/A   Occupational History  . Homemaker    Social History Main Topics  . Smoking status: Never Smoker  . Smokeless tobacco: Never Used  . Alcohol use No  . Drug use: No  . Sexual activity: Not Asked   Other Topics Concern  . None   Social History Narrative   Usually gets 6-7 hours of sleep per night   4 people living in the home.   Originally from [place] has a  bachelor's degree some training in nursing and alternative medicine.   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   Negative tobacco alcohol some caffeine   Gravida 2 para 2 last Pap 2013   Think she had T. 2012   No Known Allergies Family History  Problem Relation Age of Onset  . Heart disease Mother        Blockage/Main artery  . Diabetes Mother   . Coronary artery disease Mother   . Hyperlipidemia Mother   . Hypertension Father   . Deep vein thrombosis Father      Past medical history, social, surgical and family history all reviewed in electronic medical record.  No pertanent information unless stated regarding to the chief complaint.   Review of Systems:Review of systems updated and as accurate as of 11/30/16  No headache, visual changes, nausea, vomiting, diarrhea, constipation, dizziness, abdominal pain, skin rash, fevers, chills, night sweats, weight loss, swollen lymph nodes, body aches, joint swelling, muscle aches, chest pain, shortness of breath, mood changes.   Objective  Blood pressure 110/80, pulse 84, height 5' 5.5" (1.664 m), weight 202 lb (91.6 kg), SpO2 98 %. Systems examined below as of 11/30/16   General: No apparent  distress alert and oriented x3 mood and affect normal, dressed appropriately.  HEENT: Pupils equal, extraocular movements intact  Respiratory: Patient's speak in full sentences and does not appear short of breath  Cardiovascular: No lower extremity edema, non tender, no erythema  Skin: Warm dry intact with no signs of infection or rash on extremities or on axial skeleton.  Abdomen: Soft nontender  Neuro: Cranial nerves II through XII are intact, neurovascularly intact in all extremities with 2+ DTRs and 2+ pulses.  Lymph: No lymphadenopathy of posterior or anterior cervical chain or axillae bilaterally.  Gait normal with good balance and  coordination.  MSK:  Non tender with full range of motion and good stability and symmetric strength and tone of shoulders, elbows, wrist, hip, and ankles bilaterally. No true weakness noted on the left sign noted today.  Knee: Left knee Lateral tilt of the patella noted Tender to palpation of the medial and patellofemoral joint ROM full in flexion and extension and lower leg rotation. Ligaments with solid consistent endpoints including ACL, PCL, LCL, MCL. Negative Mcmurray's, Apley's, and Thessalonian tests. painful patellar compression. Patellar glide with moderate to severe crepitus. Patellar and quadriceps tendons unremarkable. Hamstring and quadriceps strength is normal.  Contralateral knee does have some positive grinding but nontender today. Mild arthritic changes.  After informed written and verbal consent, patient was seated on exam table. Left knee was prepped with alcohol swab and utilizing anterolateral approach, patient's left knee space was injected with 2:1  marcaine 0.5%: Kenalog 40mg /dL. Patient tolerated the procedure well without immediate complications.     Impression and Recommendations:     This case required medical decision making of moderate complexity.      Note: This dictation was prepared with Dragon dictation along with smaller phrase technology. Any transcriptional errors that result from this process are unintentional.

## 2016-11-29 NOTE — Telephone Encounter (Signed)
Please advise 

## 2016-11-30 ENCOUNTER — Ambulatory Visit (INDEPENDENT_AMBULATORY_CARE_PROVIDER_SITE_OTHER): Payer: 59 | Admitting: Family Medicine

## 2016-11-30 ENCOUNTER — Other Ambulatory Visit: Payer: Self-pay | Admitting: Emergency Medicine

## 2016-11-30 ENCOUNTER — Encounter: Payer: Self-pay | Admitting: Family Medicine

## 2016-11-30 DIAGNOSIS — M170 Bilateral primary osteoarthritis of knee: Secondary | ICD-10-CM | POA: Diagnosis not present

## 2016-11-30 MED ORDER — LORAZEPAM 1 MG PO TABS: ORAL_TABLET | ORAL | 0 refills | Status: DC

## 2016-11-30 NOTE — Assessment & Plan Note (Signed)
Left knee was injected today. Hopefully this will be beneficial. Continue home exercises icing regimen and topical anti-inflammatory's. Patient follow-up with me again in 2-3 weeks. Could be a candidate for viscous supple mentation again.

## 2016-11-30 NOTE — Telephone Encounter (Signed)
Pt calling to request if we can send referral to [facility] asap.

## 2016-11-30 NOTE — Telephone Encounter (Signed)
Patient MRI has been schedule and rx has been sent to the pharmacy. Spoke with patient and she is aware of prescription being sent in.

## 2016-11-30 NOTE — Patient Instructions (Signed)
Good to see you  Autumn Cruz is your friend.  Gave you an injection with very minimal steroid in it.  We can do synvisc if not better in 2 weeks.  Lets see what the MRI says See me again in 2-3 weeks.

## 2016-12-04 DIAGNOSIS — J069 Acute upper respiratory infection, unspecified: Secondary | ICD-10-CM | POA: Diagnosis not present

## 2016-12-06 ENCOUNTER — Ambulatory Visit: Payer: 59 | Admitting: Family Medicine

## 2016-12-06 DIAGNOSIS — R29818 Other symptoms and signs involving the nervous system: Secondary | ICD-10-CM | POA: Diagnosis not present

## 2016-12-06 DIAGNOSIS — M2578 Osteophyte, vertebrae: Secondary | ICD-10-CM | POA: Diagnosis not present

## 2016-12-06 DIAGNOSIS — M5030 Other cervical disc degeneration, unspecified cervical region: Secondary | ICD-10-CM | POA: Diagnosis not present

## 2016-12-10 ENCOUNTER — Telehealth: Payer: Self-pay | Admitting: Internal Medicine

## 2016-12-10 NOTE — Telephone Encounter (Signed)
Pt would like mri results. Pt went to [facility] on thursday

## 2016-12-10 NOTE — Telephone Encounter (Signed)
Spoke with patient and explained Dr. Fabian SharpPanosh received the results today from the MRI. Dr. Fabian SharpPanosh will review the results and I will give the patient a call back with the results hopefully tomorrow. Patient understood.

## 2016-12-10 NOTE — Telephone Encounter (Signed)
Please get me the results to review as I don't have them.  I suspect will come by fax  Not sure. And then tell patient  This

## 2016-12-10 NOTE — Telephone Encounter (Signed)
Please advise 

## 2016-12-11 ENCOUNTER — Telehealth: Payer: Self-pay | Admitting: Emergency Medicine

## 2016-12-11 ENCOUNTER — Other Ambulatory Visit: Payer: Self-pay | Admitting: Emergency Medicine

## 2016-12-11 DIAGNOSIS — R200 Anesthesia of skin: Secondary | ICD-10-CM

## 2016-12-11 NOTE — Telephone Encounter (Signed)
Spoke with patient about MRI  Results. Per Dr. Fabian SharpPanosh MRI for the head showed no acute abnormalities. The MRI for spine cervical per Dr. Fabian SharpPanosh imaging showed mild degenerative disc diease. Dr. Fabian SharpPanosh recommended consult from Neurology and patient agreed for referral to be place.

## 2016-12-21 ENCOUNTER — Encounter: Payer: Self-pay | Admitting: Internal Medicine

## 2016-12-24 ENCOUNTER — Encounter: Payer: Self-pay | Admitting: Neurology

## 2016-12-24 ENCOUNTER — Ambulatory Visit (INDEPENDENT_AMBULATORY_CARE_PROVIDER_SITE_OTHER): Payer: 59 | Admitting: Neurology

## 2016-12-24 VITALS — BP 100/74 | HR 82 | Ht 65.5 in | Wt 203.1 lb

## 2016-12-24 DIAGNOSIS — M5412 Radiculopathy, cervical region: Secondary | ICD-10-CM | POA: Diagnosis not present

## 2016-12-24 DIAGNOSIS — M542 Cervicalgia: Secondary | ICD-10-CM

## 2016-12-24 DIAGNOSIS — M4800 Spinal stenosis, site unspecified: Secondary | ICD-10-CM

## 2016-12-24 MED ORDER — TIZANIDINE HCL 2 MG PO CAPS: 2.0000 mg | ORAL_CAPSULE | Freq: Two times a day (BID) | ORAL | 3 refills | Status: DC | PRN

## 2016-12-24 NOTE — Patient Instructions (Addendum)
Start tizanidine  at bedtime.  If you are tolerating it and it does not make you too sleepy, you can take twice daily as needed.    Start neck physiotherapy  Return to clinic 3 month

## 2016-12-24 NOTE — Progress Notes (Signed)
[facility] Clinic Note - Initial Visit   Date: 12/24/16  Autumn Cruz MRN: [id number] DOB: May 14, 1975   Dear Dr. Fabian Sharp:   Thank you for your kind referral of Autumn Cruz for consultation of left arm pain. Although her history is well known to you, please allow [place] to reiterate it for the purpose of our medical record. The patient was accompanied to the clinic by husband who also provides collateral information.     History of Present Illness: Autumn Cruz is a 41 y.o. left-handed female with hyperlipidemia and history of DVT due to Factor V Leiden presenting for evaluation of left neck pain and arm tingling.    Starting around July 2018, she began having pressure over the left side of her neck and ear. She has severe pressure involving the left eye, gums, shoulder, and arms.  She also has an area of numbness across her left shoulder and into her left palmer surface of the hand. She had achy pain of the neck with reduced range of motion with neck with rotation.  Rest helps relieve her pain and activity exacerbates her pain.  She has constant neck pain, and pressure of the head is usually worse at night.  She had MRI brain and cervical spine which showed mild-moderate canal stenosis and biforaminal stenosis at C6-7.  Symptoms have been intermittent over the past 7 years, but in July, they became much more frequent and painful.  Around 2011, she also complains of left foot and sole numbness.   She had large DVT of the left leg around the same time.  NCS/EMG of the left leg in [place], [place] was normal.   Out-side paper records, electronic medical record, and images have been reviewed where available and summarized as:   MRI brain 12/06/2016:  No acute intracranial process.  Mildly expanded partial empty sella MRI cervical spine 12/06/2016:  Mild multilevel degenerative changes. Mild to moderate canal stenosis and biforaminal stenosis at C6-7.  Past Medical History:    Diagnosis Date  . DVT (deep venous thrombosis) (HCC)   . Gestational diabetes   . High cholesterol   . Hx of thrombosis 2014   Left lower extrm.  Marland Kitchen Positive TB test   . Post-phlebitic syndrome 2014   Left  LE  . Thrombosis    Left leg preg tulip filter then removed.   . Tuberculosis    Positive test    Past Surgical History:  Procedure Laterality Date  . CESAREAN SECTION  2010 & 2012     Medications:  Outpatient Encounter Prescriptions as of 12/24/2016  Medication Sig  . aspirin 81 MG tablet Take 81 mg by mouth daily.  Marland Kitchen atorvastatin (LIPITOR) 40 MG tablet atorvastatin 40 mg tablet  . Diclofenac Sodium (PENNSAID) 2 % SOLN Place 2 application onto the skin 2 (two) times daily.  . Multiple Vitamin (MULTI-VITAMIN PO) Take by mouth.  . [DISCONTINUED] LORazepam (ATIVAN) 1 MG tablet Take 1-2 tablets by mouth pre procedure   No facility-administered encounter medications on file as of 12/24/2016.      Allergies:  Allergies  Allergen Reactions  . Other     Family History: Family History  Problem Relation Age of Onset  . Heart disease Mother        Blockage/Main artery  . Diabetes Mother   . Coronary artery disease Mother   . Hyperlipidemia Mother   . Hypertension Father   . Deep vein thrombosis Father     Social History: Social  History  Substance Use Topics  . Smoking status: Never Smoker  . Smokeless tobacco: Never Used  . Alcohol use No   Social History   Social History Narrative   Usually gets 6-7 hours of sleep per night   4 people living in the home.   Originally from [place] has a bachelor's degree some training in nursing and alternative medicine.   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   Negative tobacco alcohol some caffeine   Gravida 2 para 2 last Pap 2013   Think she had T. 2012    Review of Systems:  CONSTITUTIONAL: No fevers,  chills, night sweats, or weight loss.   EYES: No visual changes or eye pain ENT: No hearing changes.  No history of nose bleeds.   + neck pain RESPIRATORY: No cough, wheezing and shortness of breath.   CARDIOVASCULAR: Negative for chest pain, and palpitations.   GI: Negative for abdominal discomfort, blood in stools or black stools.  No recent change in bowel habits.   GU:  No history of incontinence.   MUSCLOSKELETAL: No history of joint pain or swelling.  No myalgias.   SKIN: Negative for lesions, rash, and itching.   HEMATOLOGY/ONCOLOGY: Negative for prolonged bleeding, bruising easily, and swollen nodes.  No history of cancer.   ENDOCRINE: Negative for cold or heat intolerance, polydipsia or goiter.   PSYCH:  No depression or anxiety symptoms.   NEURO: As Above.   Vital Signs:  BP 100/74   Pulse 82   Ht 5' 5.5" (1.664 m)   Wt 203 lb 2 oz (92.1 kg)   SpO2 98%   BMI 33.29 kg/m    General Medical Exam:   General:  Well appearing, comfortable.   Eyes/ENT: see cranial nerve examination.   Neck: No masses appreciated.  Neck rotation the left is reduced to ~45 degrees and there is increased tension over the cervical muscles.  No carotid bruits. Respiratory:  Clear to auscultation, good air entry bilaterally.   Cardiac:  Regular rate and rhythm, no murmur.   Extremities:  No deformities, edema, or skin discoloration.  Skin:  No rashes or lesions.  Neurological Exam: MENTAL STATUS including orientation to time, place, person, recent and remote memory, attention span and concentration, language, and fund of knowledge is normal.  Speech is not dysarthric.  CRANIAL NERVES: II:  No visual field defects.  Unremarkable fundi.   III-IV-VI: Pupils equal round and reactive to light.  Normal conjugate, extra-ocular eye movements in all directions of gaze.  No nystagmus.  No ptosis.   V:  Normal facial sensation.    VII:  Normal facial symmetry and movements.  VIII:  Normal hearing and  vestibular function.   IX-X:  Normal palatal movement.   XI:  Normal shoulder shrug and head rotation.   XII:  Normal tongue strength and range of motion, no deviation or fasciculation.  MOTOR: Motor strength is 5/5 throughout.  No atrophy, fasciculations or abnormal movements.  No pronator drift.  Tone is normal.    MSRs: Right  Left brachioradialis 2+  brachioradialis 2+  biceps 1+  biceps 1+  triceps 2+  triceps 2+  patellar 2+  patellar 2+  ankle jerk 2+  ankle jerk 2+  Hoffman no  Hoffman no  plantar response down  plantar response down   SENSORY:  Normal and symmetric perception of light touch, pinprick, vibration, and proprioception.    COORDINATION/GAIT: Normal finger-to- nose-finger.  Intact rapid alternating movements bilaterally.  Gait narrow based and stable. Tandem and stressed gait intact.    IMPRESSION: 1.  Left cervical radiculopathy affecting the C7 nerve root  - MRI cervical spine shows mild-moderate biforaminal stenosis at C6-7 and canal stenosis at this level  - Start neck physiotherapy  - Start tizanidine  BID as needed  - If no improvement, consider NCS/EMG of the left arm going forward to be sure there is no overlapping peripheral nerve entrapment  2.  Long history of left foot numbness, unclear etiology with negative prior work-up.    3.  Factor V Leiden, history of extensive left leg DVT followed by PCP  Return to clinic in 3 months.   The duration of this appointment visit was 40 minutes of face-to-face time with the patient.  Greater than 50% of this time was spent in counseling, explanation of diagnosis, planning of further management, and coordination of care.   Thank you for allowing me to participate in patient's care.  If I can answer any additional questions, I would be pleased to do so.    Sincerely,    Linken Mcglothen K. Allena Katz, DO

## 2016-12-27 ENCOUNTER — Telehealth: Payer: Self-pay | Admitting: Family Medicine

## 2016-12-31 NOTE — Progress Notes (Signed)
[name] [address] [place], [place] [address] Phone: [phone] Subjective:     CC: Knee pain follow-up  BJY:[id number]  Autumn Cruz is a 41 y.o. female coming in with complaint of knee pain.  Arthritic changes. Patient did respond well to the viscous supplementation previously as well as PRP injections. Patient was doing significantly better but is having worsening symptoms again. Last steroid injection was given 11/30/2016 in the left knee. Patient states Having some more discomfort overall. Patient states that it only helped out a mild amount. Having more radiation going down the leg some. Waking her up at night and affecting daily activities.     Past Medical History:  Diagnosis Date  . DVT (deep venous thrombosis) (HCC)   . Gestational diabetes   . High cholesterol   . Hx of thrombosis 2014   Left lower extrm.  Marland Kitchen Positive TB test   . Post-phlebitic syndrome 2014   Left  LE  . Thrombosis    Left leg preg tulip filter then removed.   . Tuberculosis    Positive test   Past Surgical History:  Procedure Laterality Date  . CESAREAN SECTION  2010 & 2012   Social History   Social History  . Marital status: Married    Spouse name: [name]  . Number of children: 2  . Years of education: N/A   Occupational History  . Homemaker    Social History Main Topics  . Smoking status: Never Smoker  . Smokeless tobacco: Never Used  . Alcohol use No  . Drug use: No  . Sexual activity: Not on file   Other Topics Concern  . Not on file   Social History Narrative   Usually gets 6-7 hours of sleep per night   4 people living in the home.   Originally from [place] has a bachelor's degree some training in nursing and alternative medicine.   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   Negative tobacco alcohol some caffeine   Gravida  2 para 2 last Pap 2013   Think she had T. 2012   Allergies  Allergen Reactions  . Other    Family History  Problem Relation Age of Onset  . Heart disease Mother        Blockage/Main artery  . Diabetes Mother   . Coronary artery disease Mother   . Hyperlipidemia Mother   . Hypertension Father   . Deep vein thrombosis Father   . Renal Disease Sister      Past medical history, social, surgical and family history all reviewed in electronic medical record.  No pertanent information unless stated regarding to the chief complaint.   Review of Systems:Review of systems updated and as accurate as of 12/31/16  No headache, visual changes, nausea, vomiting, diarrhea, constipation, dizziness, abdominal pain, skin rash, fevers, chills, night sweats, weight loss, swollen lymph nodes, body aches, joint swelling, muscle aches, chest pain, shortness of breath, mood changes.   Objective  There were no vitals taken for this visit. Systems examined below as of 12/31/16   General: No apparent distress alert and oriented x3 mood and affect normal, dressed appropriately.  HEENT: Pupils equal, extraocular movements intact  Respiratory: Patient's speak in full sentences and does not appear short of breath  Cardiovascular: No lower extremity edema, non tender, no erythema  Skin:  Warm dry intact with no signs of infection or rash on extremities or on axial skeleton.  Abdomen: Soft nontender  Neuro: Cranial nerves II through XII are intact, neurovascularly intact in all extremities with 2+ DTRs and 2+ pulses.  Lymph: No lymphadenopathy of posterior or anterior cervical chain or axillae bilaterally.  Gait normal with good balance and coordination.  MSK:  Non tender with full range of motion and good stability and symmetric strength and tone of shoulders, elbows, wrist, hip and ankles bilaterally.  Knee:Left valgus deformity noted. Large thigh to calf ratio.  Tender to palpation over medial and PF joint  line.  ROM full in flexion and extension and lower leg rotation. instability with valgus force noted but mild  painful patellar compression. Patellar glide with moderate crepitus. Patellar and quadriceps tendons unremarkable. Hamstring and quadriceps strength is normal. Contralateral knee shows mild arthritic changes but nontender  After informed written and verbal consent, patient was seated on exam table. Left knee was prepped with alcohol swab and utilizing anterolateral approach, patient's left knee space was injected with 16 mg/2.5 mL of Synvisc (sodium hyaluronate) in a prefilled syringe was injected easily into the knee through a 22-gauge needle.. Patient tolerated the procedure well without immediate complications.   Impression and Recommendations:     This case required medical decision making of moderate complexity.      Note: This dictation was prepared with Dragon dictation along with smaller phrase technology. Any transcriptional errors that result from this process are unintentional.

## 2017-01-01 ENCOUNTER — Ambulatory Visit (INDEPENDENT_AMBULATORY_CARE_PROVIDER_SITE_OTHER): Payer: 59 | Admitting: Family Medicine

## 2017-01-01 ENCOUNTER — Encounter: Payer: Self-pay | Admitting: Family Medicine

## 2017-01-01 DIAGNOSIS — M170 Bilateral primary osteoarthritis of knee: Secondary | ICD-10-CM | POA: Diagnosis not present

## 2017-01-01 NOTE — Assessment & Plan Note (Addendum)
Patient does have more of a viscous supplementation. Patient is failed all conservative therapy. We discussed icing regimen and home exercises. We discussed which activities doing which ones to avoid. Patient will start to increase activity as tolerated. Follow-up again in 1-2 weeks

## 2017-01-01 NOTE — Patient Instructions (Signed)
Good to see you  Autumn Cruz is your friend.  You know the drill  See you again next week!

## 2017-01-04 DIAGNOSIS — M6281 Muscle weakness (generalized): Secondary | ICD-10-CM | POA: Diagnosis not present

## 2017-01-04 DIAGNOSIS — M542 Cervicalgia: Secondary | ICD-10-CM | POA: Diagnosis not present

## 2017-01-04 DIAGNOSIS — M79602 Pain in left arm: Secondary | ICD-10-CM | POA: Diagnosis not present

## 2017-01-05 NOTE — Progress Notes (Signed)
Autumn Cruz [address] [place], [place] [address] Phone: [phone] Subjective:     CC: Knee pain follow-up  [id number]  Autumn Cruz is a 41 y.o. female coming in with complaint of knee pain.  Arthritic changes. Patient did respond well to the viscous supplementation previously as well as PRP injections. Patient was doing significantly better but is having worsening symptoms again. Last steroid injection was given 11/30/2016 in the left kneeFailed all other conservative therapy. Here for second in a series of 3 viscous supplementation. Patient states has felt some pain.     Past Medical History:  Diagnosis Date  . DVT (deep venous thrombosis) (HCC)   . Gestational diabetes   . High cholesterol   . Hx of thrombosis 2014   Left lower extrm.  Marland Kitchen Positive TB test   . Post-phlebitic syndrome 2014   Left  LE  . Thrombosis    Left leg preg tulip filter then removed.   . Tuberculosis    Positive test   Past Surgical History:  Procedure Laterality Date  . CESAREAN SECTION  2010 & 2012   Social History   Social History  . Marital status: Married    Spouse name: Autumn Cruz  . Number of children: 2  . Years of education: N/A   Occupational History  . Homemaker    Social History Main Topics  . Smoking status: Never Smoker  . Smokeless tobacco: Never Used  . Alcohol use No  . Drug use: No  . Sexual activity: Not Asked   Other Topics Concern  . None   Social History Narrative   Usually gets 6-7 hours of sleep per night   4 people living in the home.   Originally from [place] has a bachelor's degree some training in nursing and alternative [place].   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   Negative tobacco alcohol some caffeine   Gravida 2 para 2 last Pap 2013   Think she had T. 2012   Allergies  Allergen Reactions  .  Other    Family History  Problem Relation Age of Onset  . Heart disease Mother        Blockage/Main artery  . Diabetes Mother   . Coronary artery disease Mother   . Hyperlipidemia Mother   . Hypertension Father   . Deep vein thrombosis Father   . Renal Disease Sister      Past medical history, social, surgical and family history all reviewed in electronic medical record.  No pertanent information unless stated regarding to the chief complaint.   Review of Systems:Review of systems updated and as accurate as of 01/07/17  No headache, visual changes, nausea, vomiting, diarrhea, constipation, dizziness, abdominal pain, skin rash, fevers, chills, night sweats, weight loss, swollen lymph nodes, body aches, joint swelling, muscle aches, chest pain, shortness of breath, mood changes.   Objective  Blood pressure 120/70, pulse 81, height  (1.651 m), weight 204 lb (92.5 kg), SpO2 96 %. Systems examined below as of 01/07/17   General: No apparent distress alert and oriented x3 mood and affect normal, dressed appropriately.  HEENT: Pupils equal, extraocular movements intact  Respiratory: Patient's speak in full sentences and does not appear short of breath  Cardiovascular: No lower extremity edema, non tender, no erythema  Skin: Warm dry intact with no signs of  infection or rash on extremities or on axial skeleton.  Abdomen: Soft nontender  Neuro: Cranial nerves II through XII are intact, neurovascularly intact in all extremities with 2+ DTRs and 2+ pulses.  Lymph: No lymphadenopathy of posterior or anterior cervical chain or axillae bilaterally.  Gait Mild antalgic MSK:  Non tender with full range of motion and good stability and symmetric strength and tone of shoulders, elbows, wrist, hip and ankles bilaterally.  Knee:Left valgus deformity noted. Large thigh to calf ratio.  Tender to palpation over medial and PF joint line.  ROM full in flexion and extension and lower leg  rotation. instability with valgus force.  painful patellar compression. Patellar glide with moderate crepitus. Patellar and quadriceps tendons unremarkable. Hamstring and quadriceps strength is normal. Contralateral knee shows mild arthritic changes but minimal tenderness    Impression and Recommendations:     This case required medical decision making of moderate complexity.      Note: This dictation was prepared with Dragon dictation along with smaller phrase technology. Any transcriptional errors that result from this process are unintentional.

## 2017-01-07 ENCOUNTER — Ambulatory Visit (INDEPENDENT_AMBULATORY_CARE_PROVIDER_SITE_OTHER): Payer: 59 | Admitting: Family Medicine

## 2017-01-07 ENCOUNTER — Encounter: Payer: Self-pay | Admitting: Family Medicine

## 2017-01-07 DIAGNOSIS — M170 Bilateral primary osteoarthritis of knee: Secondary | ICD-10-CM | POA: Diagnosis not present

## 2017-01-07 NOTE — Assessment & Plan Note (Signed)
Second in a series of 3 injections given today. Patient will come back in 1 week for third and final viscous supplementation. Continue conservative therapy otherwise

## 2017-01-07 NOTE — Patient Instructions (Signed)
2 own one to go  Ice is your friend.  See you next week!

## 2017-01-16 ENCOUNTER — Ambulatory Visit: Payer: 59 | Admitting: Family Medicine

## 2017-01-18 DIAGNOSIS — M6281 Muscle weakness (generalized): Secondary | ICD-10-CM | POA: Diagnosis not present

## 2017-01-18 DIAGNOSIS — M79602 Pain in left arm: Secondary | ICD-10-CM | POA: Diagnosis not present

## 2017-01-18 DIAGNOSIS — M542 Cervicalgia: Secondary | ICD-10-CM | POA: Diagnosis not present

## 2017-01-24 ENCOUNTER — Encounter: Payer: Self-pay | Admitting: Family Medicine

## 2017-01-24 ENCOUNTER — Ambulatory Visit (INDEPENDENT_AMBULATORY_CARE_PROVIDER_SITE_OTHER): Payer: 59 | Admitting: Family Medicine

## 2017-01-24 DIAGNOSIS — M170 Bilateral primary osteoarthritis of knee: Secondary | ICD-10-CM

## 2017-01-24 NOTE — Patient Instructions (Signed)
Good to see you  You made it  Keep I tup  See me again in 4 weeks

## 2017-01-24 NOTE — Progress Notes (Signed)
[name] [address] [place], [place] [address] Phone: [phone] Subjective:    I'm seeing this patient by the request  of:    CC: Bilateral knee pain  [id number]  Autumn Cruz is a 41 y.o. female coming in for her 3rd Synvisc injection. She is noticing slight improvement from the past 2 injections. Done have degenerative arthritis of the knees bilaterally. Patient has been doing relatively well.      Past Medical History:  Diagnosis Date  . DVT (deep venous thrombosis) (HCC)   . Gestational diabetes   . High cholesterol   . Hx of thrombosis 2014   Left lower extrm.  Marland Kitchen Positive TB test   . Post-phlebitic syndrome 2014   Left  LE  . Thrombosis    Left leg preg tulip filter then removed.   . Tuberculosis    Positive test   Past Surgical History:  Procedure Laterality Date  . CESAREAN SECTION  2010 & 2012   Social History   Social History  . Marital status: Married    Spouse name: [name]  . Number of children: 2  . Years of education: N/A   Occupational History  . Homemaker    Social History Main Topics  . Smoking status: Never Smoker  . Smokeless tobacco: Never Used  . Alcohol use No  . Drug use: No  . Sexual activity: Not Asked   Other Topics Concern  . None   Social History Narrative   Usually gets 6-7 hours of sleep per night   4 people living in the home.   Originally from [place] has a bachelor's degree some training in nursing and alternative medicine.   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   Negative tobacco alcohol some caffeine   Gravida 2 para 2 last Pap 2013   Think she had T. 2012   Allergies  Allergen Reactions  . Other    Family History  Problem Relation Age of Onset  . Heart disease Mother        Blockage/Main artery  . Diabetes Mother   . Coronary artery disease Mother   .  Hyperlipidemia Mother   . Hypertension Father   . Deep vein thrombosis Father   . Renal Disease Sister      Past medical history, social, surgical and family history all reviewed in electronic medical record.  No pertanent information unless stated regarding to the chief complaint.   Review of Systems:Review of systems updated and as accurate as of 01/24/17  No headache, visual changes, nausea, vomiting, diarrhea, constipation, dizziness, abdominal pain, skin rash, fevers, chills, night sweats, weight loss, swollen lymph nodes, body aches, joint swelling, muscle aches, chest pain, shortness of breath, mood changes. Positive muscle aches  Objective  Blood pressure 110/78, pulse 72, height 5\' 5"  (1.651 m), weight 203 lb (92.1 kg), SpO2 95 %. Systems examined below as of 01/24/17   General: No apparent distress alert and oriented x3 mood and affect normal, dressed appropriately.  HEENT: Pupils equal, extraocular movements intact  Respiratory: Patient's speak in full sentences and does not appear short of breath  Cardiovascular: No lower extremity edema, non tender, no erythema  Skin: Warm dry intact with no signs of infection or rash on extremities or on axial skeleton.  Abdomen: Soft nontender  Neuro: Cranial nerves II through XII  are intact, neurovascularly intact in all extremities with 2+ DTRs and 2+ pulses.  Lymph: No lymphadenopathy of posterior or anterior cervical chain or axillae bilaterally.  Gait normal with good balance and coordination.  MSK:  Non tender with full range of motion and good stability and symmetric strength and tone of shoulders, elbows, wrist, hip, and ankles bilaterally.  Knee: Left valgus deformity noted. Large thigh to calf ratio.  Tender to palpation over medial and PF joint line.  ROM full in flexion and extension and lower leg rotation. Mild instability noted.  painful patellar compression. Patellar glide with moderate crepitus. Patellar and quadriceps  tendons unremarkable. Hamstring and quadriceps strength is normal. Contralateral knee shows mild arthritic changes with valgus deformity. Nontender  After informed written and verbal consent, patient was seated on exam table. Left knee was prepped with alcohol swab and utilizing anterolateral approach, patient's left knee space was injected with 16 mg/2.5 mL of Synvisc (sodium hyaluronate) in a prefilled syringe was injected easily into the knee through a 22-gauge needle.. Patient tolerated the procedure well without immediate complications.    Impression and Recommendations:     This case required medical decision making of moderate complexity.      Note: This dictation was prepared with Dragon dictation along with smaller phrase technology. Any transcriptional errors that result from this process are unintentional.

## 2017-01-24 NOTE — Assessment & Plan Note (Signed)
Synvisc injection in the left knee given again today. Tolerated the procedure well. We discussed icing regimen. Patient was able to even walk in [place] with minimal pain. Follow-up again in 4 weeks

## 2017-01-25 DIAGNOSIS — M6281 Muscle weakness (generalized): Secondary | ICD-10-CM | POA: Diagnosis not present

## 2017-01-25 DIAGNOSIS — M79602 Pain in left arm: Secondary | ICD-10-CM | POA: Diagnosis not present

## 2017-01-25 DIAGNOSIS — M542 Cervicalgia: Secondary | ICD-10-CM | POA: Diagnosis not present

## 2017-01-25 DIAGNOSIS — R0789 Other chest pain: Secondary | ICD-10-CM | POA: Diagnosis not present

## 2017-02-04 DIAGNOSIS — Z0189 Encounter for other specified special examinations: Secondary | ICD-10-CM | POA: Diagnosis not present

## 2017-02-04 DIAGNOSIS — M542 Cervicalgia: Secondary | ICD-10-CM | POA: Diagnosis not present

## 2017-02-04 DIAGNOSIS — M79602 Pain in left arm: Secondary | ICD-10-CM | POA: Diagnosis not present

## 2017-02-04 DIAGNOSIS — E7800 Pure hypercholesterolemia, unspecified: Secondary | ICD-10-CM | POA: Diagnosis not present

## 2017-02-04 DIAGNOSIS — M6281 Muscle weakness (generalized): Secondary | ICD-10-CM | POA: Diagnosis not present

## 2017-02-04 LAB — BASIC METABOLIC PANEL
BUN: 9 (ref 4–21)
CREATININE: 0.9 (ref 0.5–1.1)
Glucose: 90
POTASSIUM: 4.2 (ref 3.4–5.3)
Sodium: 142 (ref 137–147)

## 2017-02-04 LAB — LIPID PANEL
Cholesterol: 169 (ref 0–200)
HDL: 49 (ref 35–70)
LDL CALC: 103
Triglycerides: 85 (ref 40–160)

## 2017-02-04 LAB — HEPATIC FUNCTION PANEL
ALT: 20 (ref 7–35)
AST: 15 (ref 13–35)

## 2017-02-06 DIAGNOSIS — M6281 Muscle weakness (generalized): Secondary | ICD-10-CM | POA: Diagnosis not present

## 2017-02-06 DIAGNOSIS — M542 Cervicalgia: Secondary | ICD-10-CM | POA: Diagnosis not present

## 2017-02-06 DIAGNOSIS — M79602 Pain in left arm: Secondary | ICD-10-CM | POA: Diagnosis not present

## 2017-02-07 DIAGNOSIS — M792 Neuralgia and neuritis, unspecified: Secondary | ICD-10-CM | POA: Diagnosis not present

## 2017-02-07 DIAGNOSIS — D6851 Activated protein C resistance: Secondary | ICD-10-CM | POA: Diagnosis not present

## 2017-02-07 DIAGNOSIS — E7800 Pure hypercholesterolemia, unspecified: Secondary | ICD-10-CM | POA: Diagnosis not present

## 2017-02-11 ENCOUNTER — Encounter: Payer: Self-pay | Admitting: Internal Medicine

## 2017-02-15 ENCOUNTER — Telehealth: Payer: Self-pay | Admitting: Family Medicine

## 2017-02-15 DIAGNOSIS — M2550 Pain in unspecified joint: Secondary | ICD-10-CM

## 2017-02-15 DIAGNOSIS — M170 Bilateral primary osteoarthritis of knee: Secondary | ICD-10-CM

## 2017-02-15 NOTE — Telephone Encounter (Signed)
Copied from CRM [id number]. Topic: Referral - Request >> Feb 15, 2017 12:03 PM Percival SpanishKennedy, Cheryl W wrote:   Pt call to ask for recommendation to see someone about her arthritis

## 2017-02-15 NOTE — Telephone Encounter (Signed)
Pt is requesting an asap referral to someone to see for her arthritis. Pt states that this has become an issue and is making her hard on her daily. Pt states that she is going to be traveling a lot this Holiday season and wants to try and see someone ASAP. Aware that Dr Fabian SharpPanosh is out of the office until 02/20/17 and there will be a delay in response, pt is requesting recommendations from another MD in her absence. I explained that given she is Dr Rosezella FloridaPanosh's patient that another provider may not feel comfortable referring.  Aware that I will ask but this may have to wait until 02/20/17. Pt was okay with this but just requests that I ask someone.   Please advise Dr Clent RidgesFry if able to give recommendations in Dr Rosezella FloridaPanosh's absence. Thanks.

## 2017-02-15 NOTE — Telephone Encounter (Signed)
Please tell her any referrals will need to come from Dr. Fabian SharpPanosh. In the meantime she can take Ibuprofen 600-800 mg every 6 hours as needed

## 2017-02-16 NOTE — Progress Notes (Deleted)
Autumn ScaleZach Krosby Ritchie D.O. [facility] [address] [place], [place] [address] Phone: [phone] Subjective:     CC: Pain follow-up  BJY:[id number]:Subjective  Autumn Cruz is a 41 y.o. female coming in with complaint of pain.  For bilateral knee pain.  Was found to have osteoarthritic changes.  Patient left knee continue to give her trouble and patient did just finish a round of Visco supplementation 1 month ago.  Patient states      Past Medical History:  Diagnosis Date  . DVT (deep venous thrombosis) (HCC)   . Gestational diabetes   . High cholesterol   . Hx of thrombosis 2014   Left lower extrm.  Marland Kitchen. Positive TB test   . Post-phlebitic syndrome 2014   Left  LE  . Thrombosis    Left leg preg tulip filter then removed.   . Tuberculosis    Positive test   Past Surgical History:  Procedure Laterality Date  . CESAREAN SECTION  2010 & 2012   Social History   Socioeconomic History  . Marital status: Married    Spouse name: Autumn Cruz  . Number of children: 2  . Years of education: Not on file  . Highest education level: Not on file  Social Needs  . Financial resource strain: Not on file  . Food insecurity - worry: Not on file  . Food insecurity - inability: Not on file  . Transportation needs - medical: Not on file  . Transportation needs - non-medical: Not on file  Occupational History  . Occupation: Homemaker  Tobacco Use  . Smoking status: Never Smoker  . Smokeless tobacco: Never Used  Substance and Sexual Activity  . Alcohol use: No    Alcohol/week: 0.0 oz  . Drug use: No  . Sexual activity: Not on file  Other Topics Concern  . Not on file  Social History Narrative   Usually gets 6-7 hours of sleep per night   4 people living in the home.   Originally from [place] has a bachelor's degree some training in nursing and alternative medicine.   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   Negative tobacco alcohol some caffeine   Gravida 2 para 2 last Pap 2013   Think she had T. 2012   Allergies  Allergen Reactions  . Other    Family History  Problem Relation Age of Onset  . Heart disease Mother        Blockage/Main artery  . Diabetes Mother   . Coronary artery disease Mother   . Hyperlipidemia Mother   . Hypertension Father   . Deep vein thrombosis Father   . Renal Disease Sister      Past medical history, social, surgical and family history all reviewed in electronic medical record.  No pertanent information unless stated regarding to the chief complaint.   Review of Systems:Review of systems updated and as accurate as of 02/16/17  No headache, visual changes, nausea, vomiting, diarrhea, constipation, dizziness, abdominal pain, skin rash, fevers, chills, night sweats, weight loss, swollen lymph nodes, body aches, joint swelling, muscle aches, chest pain, shortness of breath, mood changes.   Objective  There were no vitals taken for this visit. Systems examined below as of 02/16/17   General: No apparent distress alert and oriented x3 mood and affect normal, dressed appropriately.  HEENT: Pupils equal, extraocular movements intact  Respiratory: Patient's speak in full sentences and does not appear short of breath  Cardiovascular: No lower extremity edema, non tender, no erythema  Skin: Warm dry intact with no signs of infection or rash on extremities or on axial skeleton.  Abdomen: Soft nontender  Neuro: Cranial nerves II through XII are intact, neurovascularly intact in all extremities with 2+ DTRs and 2+ pulses.  Lymph: No lymphadenopathy of posterior or anterior cervical chain or axillae bilaterally.  Gait normal with good balance and coordination.  MSK:  Non tender with full range of motion and good stability and symmetric strength and tone of shoulders, elbows, wrist, hip, knee and ankles bilaterally.     Impression and  Recommendations:     This case required medical decision making of moderate complexity.      Note: This dictation was prepared with Dragon dictation along with smaller phrase technology. Any transcriptional errors that result from this process are unintentional.

## 2017-02-18 ENCOUNTER — Ambulatory Visit: Payer: 59 | Admitting: Family Medicine

## 2017-02-18 NOTE — Telephone Encounter (Signed)
Pt aware that the referral will have to wait until Dr Fabian SharpPanosh returns.  Pt not happy with this, states she is trying to get in with someone before the end of the year, as her insurance resets 04/02/2017. I apologized for the inconvenience - pt states that her office does not "need" a referral but she was just wanting Dr Rosezella FloridaPanosh's advise.  I advised that she can contact any office she has in mind for an appt WITHOUT a referral being placed. Aware again that she will return 02/20/17.

## 2017-02-20 NOTE — Telephone Encounter (Signed)
Second answer reconstructed. Please assess if it is mostly her knees or other joints also.   If it is only her needs can consider seeing orthopedics but that is similar assessment to Dr. Katrinka BlazingSmith. Otherwise can refer to rheumatology. For  Other causes of arthritis and joint pains   I would be happy to see her in the office to discuss further  with more history if needed.

## 2017-02-20 NOTE — Telephone Encounter (Signed)
I agree with referral   To dr D  thanks

## 2017-02-20 NOTE — Telephone Encounter (Signed)
Pt states that all her joints from her pelvic bone down hurt. Pt states that its not solely her knees. Pt states that she can feel pain down deep into the bone. Pt states that she has seen Ortho and Dr Katrinka BlazingSmith before and wants someone more along the Arthritis treatment. Pt wants to be referred to Rheum. Dr Jeananne RamaShaili Devashwar.   Pt aware that order will be placed as Urgent so that hopefully they will expedite her appt a little - no guarantees though.

## 2017-02-26 NOTE — Telephone Encounter (Signed)
Referral placed Patient aware via voicemail.  Nothing further needed.

## 2017-02-28 ENCOUNTER — Ambulatory Visit (INDEPENDENT_AMBULATORY_CARE_PROVIDER_SITE_OTHER): Payer: 59 | Admitting: Family Medicine

## 2017-02-28 ENCOUNTER — Encounter: Payer: Self-pay | Admitting: Family Medicine

## 2017-02-28 ENCOUNTER — Other Ambulatory Visit (INDEPENDENT_AMBULATORY_CARE_PROVIDER_SITE_OTHER): Payer: 59

## 2017-02-28 VITALS — BP 124/80 | HR 88 | Ht 65.0 in | Wt 204.0 lb

## 2017-02-28 DIAGNOSIS — M25561 Pain in right knee: Secondary | ICD-10-CM

## 2017-02-28 DIAGNOSIS — G8929 Other chronic pain: Secondary | ICD-10-CM

## 2017-02-28 DIAGNOSIS — M2550 Pain in unspecified joint: Secondary | ICD-10-CM | POA: Diagnosis not present

## 2017-02-28 DIAGNOSIS — M170 Bilateral primary osteoarthritis of knee: Secondary | ICD-10-CM

## 2017-02-28 DIAGNOSIS — M25562 Pain in left knee: Secondary | ICD-10-CM

## 2017-02-28 LAB — COMPREHENSIVE METABOLIC PANEL
ALK PHOS: 75 U/L (ref 39–117)
ALT: 25 U/L (ref 0–35)
AST: 18 U/L (ref 0–37)
Albumin: 4.5 g/dL (ref 3.5–5.2)
BILIRUBIN TOTAL: 1.1 mg/dL (ref 0.2–1.2)
BUN: 12 mg/dL (ref 6–23)
CO2: 29 meq/L (ref 19–32)
CREATININE: 0.78 mg/dL (ref 0.40–1.20)
Calcium: 9.7 mg/dL (ref 8.4–10.5)
Chloride: 101 mEq/L (ref 96–112)
GFR: 85.84 mL/min (ref >60.00)
GLUCOSE: 100 mg/dL — AB (ref 70–99)
Potassium: 3.5 mEq/L (ref 3.5–5.1)
SODIUM: 137 meq/L (ref 135–145)
TOTAL PROTEIN: 8.1 g/dL (ref 6.0–8.3)

## 2017-02-28 LAB — URIC ACID: Uric Acid, Serum: 5.5 mg/dL (ref 2.4–7.0)

## 2017-02-28 LAB — CBC WITH DIFFERENTIAL/PLATELET
BASOS ABS: 0.1 10*3/uL (ref 0.0–0.1)
BASOS PCT: 0.7 % (ref 0.0–3.0)
Eosinophils Absolute: 0.1 10*3/uL (ref 0.0–0.7)
Eosinophils Relative: 1 % (ref 0.0–5.0)
HEMATOCRIT: 43 % (ref 36.0–46.0)
HEMOGLOBIN: 14.5 g/dL (ref 12.0–15.0)
LYMPHS PCT: 25.5 % (ref 12.0–46.0)
Lymphs Abs: 2.2 10*3/uL (ref 0.7–4.0)
MCHC: 33.7 g/dL (ref 30.0–36.0)
MCV: 94.3 fl (ref 78.0–100.0)
Monocytes Absolute: 0.4 10*3/uL (ref 0.1–1.0)
Monocytes Relative: 4.9 % (ref 3.0–12.0)
NEUTROS ABS: 5.8 10*3/uL (ref 1.4–7.7)
Neutrophils Relative %: 67.9 % (ref 43.0–77.0)
PLATELETS: 240 10*3/uL (ref 150.0–400.0)
RBC: 4.56 Mil/uL (ref 3.87–5.11)
RDW: 12.2 % (ref 11.5–15.5)
WBC: 8.5 10*3/uL (ref 4.0–10.5)

## 2017-02-28 LAB — IBC PANEL
Iron: 126 ug/dL (ref 42–145)
Saturation Ratios: 30.8 % (ref 20.0–50.0)
TRANSFERRIN: 292 mg/dL (ref 212.0–360.0)

## 2017-02-28 LAB — SEDIMENTATION RATE: SED RATE: 28 mm/h — AB (ref 0–20)

## 2017-02-28 LAB — TSH: TSH: 0.82 u[IU]/mL (ref 0.35–4.50)

## 2017-02-28 LAB — C-REACTIVE PROTEIN: CRP: 0.9 mg/dL (ref 0.5–20.0)

## 2017-02-28 MED ORDER — PREDNISONE 50 MG PO TABS: 50.0000 mg | ORAL_TABLET | Freq: Every day | ORAL | 0 refills | Status: DC

## 2017-02-28 MED ORDER — MELOXICAM 15 MG PO TABS: 15.0000 mg | ORAL_TABLET | Freq: Every day | ORAL | 0 refills | Status: DC

## 2017-02-28 NOTE — Assessment & Plan Note (Signed)
Continuing to have pain more in the patient did have a fairly good response to PRP previously for degenerative meniscal tear.  We will see if this continues at this time.  We discussed icing regimen, home exercise, patient will be scheduled for the PRP.  Does not want any surgical intervention in this country but will consider it when she goes home to visit her family in [place]

## 2017-02-28 NOTE — Patient Instructions (Addendum)
Good to see you  Ice 20 minutes 2 times daily. Usually after activity and before bed. We will get you set up for PRP soon Ask medical records if they can get you a CD or USB of your notes with me.  Prednisone daily for 5 days.  After 5 days start the meloxicam

## 2017-02-28 NOTE — Assessment & Plan Note (Signed)
Patient has been having polyarthralgia.  Patient will get laboratory workup otherwise.  If unremarkable encourage patient continue to follow-up with rheumatology.  This will just help us with some of the screening labs

## 2017-02-28 NOTE — Progress Notes (Signed)
Tawana ScaleZach Jashae Wiggs D.O. [facility] [address] [place], [place] [address] Phone: [phone](336) [phone] Subjective:    I'm seeing this patient by the request  of:    CC: Bilateral knee pain follow-up  XLK:[id number]:Subjective  Uri Vanwyk is a 41 y.o. female coming in with complaint of bilateral knee pain.  Found to have mild to moderate degenerative changes of the knee.  Patient has failed all conservative therapy and was started on Visco supplementation.  Last injection was January 24, 2018.  Patient was to continue conservative therapy otherwise.  Patient states left knee seems to be hurting.  Right knee seems to be doing very well.  Patient is also complaining of multiple different pains.  Seems to be from the hips on down.  Scribes it as a dull, throbbing aching pain.  Has had a.  Patient is scheduled to see a rheumatologist in the near future but has not had it.  Past medical history significant for clotting disorder as well as a deep venous thrombosis.     Past Medical History:  Diagnosis Date  . DVT (deep venous thrombosis) (HCC)   . Gestational diabetes   . High cholesterol   . Hx of thrombosis 2014   Left lower extrm.  Marland Kitchen. Positive TB test   . Post-phlebitic syndrome 2014   Left  LE  . Thrombosis    Left leg preg tulip filter then removed.   . Tuberculosis    Positive test   Past Surgical History:  Procedure Laterality Date  . CESAREAN SECTION  2010 & 2012   Social History   Socioeconomic History  . Marital status: Married    Spouse name: [name]  . Number of children: 2  . Years of education: None  . Highest education level: None  Social Needs  . Financial resource strain: None  . Food insecurity - worry: None  . Food insecurity - inability: None  . Transportation needs - medical: None  . Transportation needs - non-medical: None  Occupational History  . Occupation: Homemaker  Tobacco Use  . Smoking status: Never Smoker  . Smokeless tobacco: Never Used  Substance  and Sexual Activity  . Alcohol use: No    Alcohol/week: 0.0 oz  . Drug use: No  . Sexual activity: None  Other Topics Concern  . None  Social History Narrative   Usually gets 6-7 hours of sleep per night   4 people living in the home.   Originally from [place] has a bachelor's degree some training in nursing and alternative medicine.   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   Negative tobacco alcohol some caffeine   Gravida 2 para 2 last Pap 2013   Think she had T. 2012   Allergies  Allergen Reactions  . Other    Family History  Problem Relation Age of Onset  . Heart disease Mother        Blockage/Main artery  . Diabetes Mother   . Coronary artery disease Mother   . Hyperlipidemia Mother   . Hypertension Father   . Deep vein thrombosis Father   . Renal Disease Sister      Past medical history, social, surgical and family history all reviewed in electronic medical record.  No pertanent information unless stated regarding to the chief complaint.   Review of Systems:Review of systems updated and as accurate  as of 02/28/17  No headache, visual changes, nausea, vomiting, diarrhea, constipation, dizziness, abdominal pain, skin rash, fevers, chills, night sweats, weight loss, swollen lymph nodes,, chest pain, shortness of breath, mood changes.  Positive body aches, muscle aches, joint swelling  Objective  Blood pressure 124/80, pulse 88, height 5\' 5"  (1.651 m), weight 204 lb (92.5 kg), SpO2 98 %. Systems examined below as of 02/28/17   General: No apparent distress alert and oriented x3 mood and affect normal, dressed appropriately.  HEENT: Pupils equal, extraocular movements intact  Respiratory: Patient's speak in full sentences and does not appear short of breath  Cardiovascular: No lower extremity edema, non tender, no erythema  Skin: Warm dry intact with no signs  of infection or rash on extremities or on axial skeleton.  Abdomen: Soft nontender  Neuro: Cranial nerves II through XII are intact, neurovascularly intact in all extremities with 2+ DTRs and 2+ pulses.  Lymph: No lymphadenopathy of posterior or anterior cervical chain or axillae bilaterally.  Gait normal with good balance and coordination.  MSK: Diffuse tender with full range of motion and good stability and symmetric strength and tone of shoulders, elbows, wrist, hip, and ankles bilaterally.  Knee: Left Normal to inspection with no erythema or effusion or obvious bony abnormalities. Palpation of the left knee shows no increased discomfort over the medial lateral joint line ROM full in flexion and extension and lower leg rotation. Ligaments with solid consistent endpoints including ACL, PCL, LCL, MCL. Negative Mcmurray's, Apley's, and Thessalonian tests. Positive painful patellar compression. Patellar glide moderate crepitus. Patellar and quadriceps tendons unremarkable. Hamstring and quadriceps strength is normal. Contralateral knee mild discomfort but very minimal.   Impression and Recommendations:     This case required medical decision making of moderate complexity.      Note: This dictation was prepared with Dragon dictation along with smaller phrase technology. Any transcriptional errors that result from this process are unintentional.

## 2017-03-03 LAB — PTH, INTACT AND CALCIUM
Calcium: 9.7 mg/dL (ref 8.6–10.2)
PTH: 18 pg/mL (ref 14–64)

## 2017-03-03 LAB — VITAMIN D 1,25 DIHYDROXY
VITAMIN D 1, 25 (OH) TOTAL: 50 pg/mL (ref 18–72)
VITAMIN D3 1, 25 (OH): 50 pg/mL
Vitamin D2 1, 25 (OH)2: <8 pg/mL

## 2017-03-03 LAB — RHEUMATOID FACTOR

## 2017-03-03 LAB — CYCLIC CITRUL PEPTIDE ANTIBODY, IGG: Cyclic Citrullin Peptide Ab: <16 UNITS

## 2017-03-03 LAB — ANA: Anti Nuclear Antibody(ANA): NEGATIVE

## 2017-03-05 NOTE — Telephone Encounter (Signed)
error 

## 2017-03-07 NOTE — Progress Notes (Signed)
Tawana ScaleZach Tameaka Eichhorn D.O. [facility] [address] [place], [place] [address] Phone: [phone] Subjective:    I'm seeing this patient by the request  of:    CC: Knee pain  ZHY:[id number]:Subjective  Autumn Cruz is a 41 y.o. female coming in with complaint of knee pain.  Has been found to have mild to moderate degenerative changes of the knee as well as meniscal tear.  Patient responded very well on the contralateral side the PRP injection.  Patient has failed all conservative therapy including Visco supplementation on this knee and is here for PRP injections.       Past Medical History:  Diagnosis Date  . DVT (deep venous thrombosis) (HCC)   . Gestational diabetes   . High cholesterol   . Hx of thrombosis 2014   Left lower extrm.  Marland Kitchen. Positive TB test   . Post-phlebitic syndrome 2014   Left  LE  . Thrombosis    Left leg preg tulip filter then removed.   . Tuberculosis    Positive test   Past Surgical History:  Procedure Laterality Date  . CESAREAN SECTION  2010 & 2012   Social History   Socioeconomic History  . Marital status: Married    Spouse name: [name]  . Number of children: 2  . Years of education: None  . Highest education level: None  Social Needs  . Financial resource strain: None  . Food insecurity - worry: None  . Food insecurity - inability: None  . Transportation needs - medical: None  . Transportation needs - non-medical: None  Occupational History  . Occupation: Homemaker  Tobacco Use  . Smoking status: Never Smoker  . Smokeless tobacco: Never Used  Substance and Sexual Activity  . Alcohol use: No    Alcohol/week: 0.0 oz  . Drug use: No  . Sexual activity: None  Other Topics Concern  . None  Social History Narrative   Usually gets 6-7 hours of sleep per night   4 people living in the home.   Originally from [place] has a bachelor's degree some training in nursing and alternative medicine.   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   Negative tobacco alcohol some caffeine   Gravida 2 para 2 last Pap 2013   Think she had T. 2012   Allergies  Allergen Reactions  . Other    Family History  Problem Relation Age of Onset  . Heart disease Mother        Blockage/Main artery  . Diabetes Mother   . Coronary artery disease Mother   . Hyperlipidemia Mother   . Hypertension Father   . Deep vein thrombosis Father   . Renal Disease Sister      Past medical history, social, surgical and family history all reviewed in electronic medical record.  No pertanent information unless stated regarding to the chief complaint.   Review of Systems:Review of systems updated and as accurate as of 03/08/17  No headache, visual changes, nausea, vomiting, diarrhea, constipation, dizziness, abdominal pain, skin rash, fevers, chills, night sweats, weight loss, swollen lymph nodes, body aches, joint swelling, muscle aches, chest pain, shortness of breath, mood changes.   Objective  Height 5\' 5"  (1.651 m), weight 201 lb (91.2 kg). Systems examined below as of 03/08/17   General: No apparent distress alert and oriented x3 mood and affect  normal, dressed appropriately.  HEENT: Pupils equal, extraocular movements intact  Respiratory: Patient's speak in full sentences and does not appear short of breath  Cardiovascular: No lower extremity edema, non tender, no erythema  Skin: Warm dry intact with no signs of infection or rash on extremities or on axial skeleton.  Abdomen: Soft nontender  Neuro: Cranial nerves II through XII are intact, neurovascularly intact in all extremities with 2+ DTRs and 2+ pulses.  Lymph: No lymphadenopathy of posterior or anterior cervical chain or axillae bilaterally.  Gait mild antalgic MSK:  Non tender with full range of motion and good stability and symmetric strength and tone of shoulders, elbows, wrist, hip,  and ankles bilaterally.   Knee exam still shows a positive McMurray's.  Patient does have some osteoarthritic changes.  Full range of motion but patient does have pain in the terminal extension.  After informed written and verbal consent, patient was seated on exam table. Left knee was prepped with alcohol swab and utilizing anterolateral approach, patient's left knee space was injected with pre-centrifuge PRP of 1104mL's.. Patient tolerated the procedure well without immediate complications.    Impression and Recommendations:     This case required medical decision making of moderate complexity.      Note: This dictation was prepared with Dragon dictation along with smaller phrase technology. Any transcriptional errors that result from this process are unintentional.

## 2017-03-08 ENCOUNTER — Encounter: Payer: Self-pay | Admitting: Family Medicine

## 2017-03-08 ENCOUNTER — Ambulatory Visit (INDEPENDENT_AMBULATORY_CARE_PROVIDER_SITE_OTHER): Payer: 59 | Admitting: Family Medicine

## 2017-03-08 DIAGNOSIS — M170 Bilateral primary osteoarthritis of knee: Secondary | ICD-10-CM

## 2017-03-08 NOTE — Assessment & Plan Note (Signed)
PRP of the left knee done today.  We discussed again about avoiding icing as well as ibuprofen to progression.  Patient is going to increase slowly.  Follow-up again in 3 weeks

## 2017-03-08 NOTE — Patient Instructions (Signed)
You know the drill  No ice or ibuprofen for 3 days if you can  Follow the plan  See me again in 3-4 weeks!

## 2017-03-11 ENCOUNTER — Encounter: Payer: Self-pay | Admitting: Internal Medicine

## 2017-03-11 LAB — EXERCISE TOLERANCE TEST: EXERCISE TOLERANCE TEST: NORMAL

## 2017-03-14 DIAGNOSIS — H35711 Central serous chorioretinopathy, right eye: Secondary | ICD-10-CM | POA: Diagnosis not present

## 2017-03-25 ENCOUNTER — Other Ambulatory Visit: Payer: Self-pay | Admitting: *Deleted

## 2017-03-25 MED ORDER — MELOXICAM 15 MG PO TABS: 15.0000 mg | ORAL_TABLET | Freq: Every day | ORAL | 0 refills | Status: DC

## 2017-03-25 NOTE — Telephone Encounter (Signed)
meloxicam refilled to pharmacy.

## 2017-04-12 NOTE — Progress Notes (Signed)
Office Visit Note  Patient: Autumn Cruz             Date of Birth: 10/03/1975           MRN: [id number]             PCP: Burnis Medin, MD Referring: Burnis Medin, MD Visit Date: 04/17/2017 Occupation: [employer]    Subjective:  Other (polyarthralgia )   History of Present Illness: Autumn Cruz is a 42 y.o. female seen in consultation per request of her PCP. She was accompanied by her husband today. According to patient her symptoms with left knee joint pain is started in her 44s. She states she was very active as a teenager she parts pitted in martial arts and also played hockey. She used to live in [place] where her father was a Engineer, drilling. She states she has seen her father several times and had lab work which was normal. In 1997 she moved to [place] and took mostly over-the-counter medications for discomfort. In 2010 she developed a DVT while she was pregnant at the time she had increased left lower extremity pain arm. She also started experiencing increased left knee joint pain. She went to see an orthopedic surgeon in [place] who gave her physical therapy and Visco supplement injections which helped her for next 5-6 years. She moved to [place] after that. She recalls in 2017 she started seeing an orthopedic doctor and a sports medicine doctor for knee joint pain. She was under care of Dr. Hulan Saas in 2017 at the time she was having pain in her bilateral knee joints. He gave her cortisone injection to her bilateral knee joints followed by Visco supplement injections and then PRP. In December 2017 she went to [place] for a wedding where she had severe pain in her left knee joint and had MRI in [place]. She states the MRI report showed meniscal tear and osteoarthritis. When she came back she had those MRI results reviewed by her orthopedic doctor who suggested arthroscopic surgery and meniscectomy in case her symptoms persist. She opted for physical therapy weight loss only. She states her  pain gradually got better but still she has constant pain in her left knee joint. She also continues to have some discomfort in her right knee joint. Recently she's been having pain in her bilateral wrist and also her left ankle joint. She notices swelling in her both knees and her left ankle. She states she uses ice off and on for discomfort and swelling. She recalls in 2016 she developed a rash on her left lower extremity for which she was seen by dermatologist she had a skin biopsy and was given topical steroids which were not effective she was given oral prednisone at high dose which caused visual changes and she discontinued the medication. She had intralesional cortisone injections which helped resolve the rash. She is left with some pigmentation in the area. She's also had a day pain and discomfort in her C-spine for several years. She had MRI of her C-spine which was consistent with multilevel disc disease and spinal stenosis. She is not having much discomfort in her C-spine at this time. She denies any history of lower back pain and SI joint pain. There is no history of psoriasis.   Marland Kitchen MeActivities of Daily Living:  Patient reports morning stiffness for 15-30 minutes.   Patient Reports nocturnal pain.  Difficulty dressing/grooming: Denies Difficulty climbing stairs: Reports Difficulty getting out of chair: Reports Difficulty using hands for  taps, buttons, cutlery, and/or writing: Denies   Review of Systems  Constitutional: Positive for fatigue. Negative for night sweats, weight gain, weight loss and weakness.  HENT: Negative for mouth sores, trouble swallowing, trouble swallowing, mouth dryness and nose dryness.   Eyes: Negative for pain, redness, visual disturbance and dryness.  Respiratory: Negative for cough, shortness of breath and difficulty breathing.   Cardiovascular: Negative for chest pain, palpitations, hypertension, irregular heartbeat and swelling in legs/feet.  Gastrointestinal:  Negative for blood in stool, constipation and diarrhea.  Endocrine: Negative for increased urination.  Genitourinary: Negative for vaginal dryness.  Musculoskeletal: Positive for arthralgias, joint pain, joint swelling, myalgias, morning stiffness and myalgias. Negative for muscle weakness and muscle tenderness.  Skin: Positive for rash. Negative for color change, hair loss, skin tightness, ulcers and sensitivity to sunlight.       Rash on left lower extremity  Allergic/Immunologic: Negative for susceptible to infections.  Neurological: Negative for dizziness, memory loss and night sweats.  Hematological: Negative for swollen glands.  Psychiatric/Behavioral: Positive for sleep disturbance. Negative for depressed mood. The patient is not nervous/anxious.     PMFS History:  Patient Active Problem List   Diagnosis Date Noted  . Polyarthralgia 02/28/2017  . Degenerative tear of medial meniscus, right 04/09/2016  . Degenerative arthritis of knee, bilateral 01/31/2016  . Skin lesion of left leg 05/30/2014  . Hx gestational diabetes 02/04/2013  . Visit for preventive health examination 02/04/2013  . Long term (current) use of anticoagulants 09/29/2012  . Family history of premature CAD 09/24/2012  . Hyperlipidemia 09/24/2012  . Hx of thrombosis of lower extremity in pregnancy 09/24/2012  . Hypercoagulable state ([place]) 09/24/2012  . Factor V Leiden mutation ([place]) 09/24/2012  . Elevated C-reactive protein (CRP) 09/24/2012  . Post-phlebitic syndrome 09/24/2012    Past Medical History:  Diagnosis Date  . DVT (deep venous thrombosis) ([place])   . Gestational diabetes   . High cholesterol   . Hx of thrombosis 2014   Left lower extrm.  Marland Kitchen Positive TB test   . Post-phlebitic syndrome 2014   Left  LE  . Thrombosis    Left leg preg tulip filter then removed.   . Tuberculosis    Positive test    Family History  Problem Relation Age of Onset  . Heart disease Mother        Blockage/Main artery   . Diabetes Mother   . Coronary artery disease Mother   . Hyperlipidemia Mother   . Hypertension Father   . Deep vein thrombosis Father   . Renal Disease Sister   . High Cholesterol Sister   . High Cholesterol Sister   . High Cholesterol Sister    Past Surgical History:  Procedure Laterality Date  . CESAREAN SECTION  2010 & 2012   Social History   Social History Narrative   Usually gets 6-7 hours of sleep per night   4 people living in the home.   Originally from [place] has a bachelor's degree some training in nursing and alternative medicine.   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   Negative tobacco alcohol some caffeine   Gravida 2 para 2 last Pap 2013   Think she had T. 2012     Objective: Vital Signs: BP 118/74 (BP Location: Left Arm, Patient Position: Sitting, Cuff Size: Normal)   Pulse 69   Resp 18  Ht 5' 5.5" (1.664 m)   Wt 201 lb (91.2 kg)   BMI 32.94 kg/m    Physical Exam  Constitutional: She is oriented to person, place, and time. She appears well-developed and well-nourished.  HENT:  Head: Normocephalic and atraumatic.  Eyes: Conjunctivae and EOM are normal.  Neck: Normal range of motion.  Cardiovascular: Normal rate, regular rhythm, normal heart sounds and intact distal pulses.  Pulmonary/Chest: Effort normal and breath sounds normal.  Abdominal: Soft. Bowel sounds are normal.  Lymphadenopathy:    She has no cervical adenopathy.  Neurological: She is alert and oriented to person, place, and time.  Skin: Skin is warm and dry. Capillary refill takes less than 2 seconds.  Hyperpigmentation on the left lower extremity  Psychiatric: She has a normal mood and affect. Her behavior is normal.  Nursing note and vitals reviewed.    Musculoskeletal Exam: C-spine good range of motion. Thoracic and lumbar spine good range of motion. No SI joint  tenderness was noted. Shoulder joints elbow joints wrist joint MCPs PIPs DIPs with good range of motion with no synovitis. She has some tenderness across her PIPs of her hands and feet. Good range of motion of her hip joints knee joints ankles MTPs PIPs DIPs was noted. She has some warmth and swelling in her left knee joint. She is tenderness over left ankle joint.  CDAI Exam: No CDAI exam completed.    Investigation: Findings:  Labs from by patient today on a spreadsheet were reviewed. 2003 ace level normal 2010 ENA negative which included dsDNA, Smith, Ro, Lo, SCL 70, RNP, anti-cardiolipin antibody negative, lupus anticoagulant negative, HLA-B27 negative 02/28/2017 CBC normal, CMP normal, TSH normal, PTH normal, iron studies normal, vitamin D 50 Uric acid normal, RF negative, anti-CCP negative, ANA negative, CRP normal, ESR 28   0Component     Latest Ref Rng & Units 02/28/2017  Iron     42 - 145 ug/dL 126  Transferrin     212.0 - 360.0 mg/dL 292.0  Saturation Ratios     20.0 - 50.0 % 30.8  Vitamin D 1, 25 (OH) Total     18 - 72 pg/mL 50  Vitamin D3 1, 25 (OH)     pg/mL 50  Vitamin D2 1, 25 (OH)     pg/mL <8  Cyclic Citrullin Peptide Ab     UNITS <16  RA Latex Turbid.     <14 IU/mL <14  Anit Nuclear Antibody(ANA)     NEGATIVE NEGATIVE  TSH     0.35 - 4.50 uIU/mL 0.82  CRP     0.5 - 20.0 mg/dL 0.9  Sed Rate     0 - 20 mm/hr 28 (H)  Uric acid WNL on 02/28/17 CBC Latest Ref Rng & Units 02/28/2017 08/29/2016 12/20/2015  WBC 4.0 - 10.5 K/uL 8.5 7.1 7.0  Hemoglobin 12.0 - 15.0 g/dL 14.5 14.3 13.3  Hematocrit 36.0 - 46.0 % 43.0 43 38.8  Platelets 150.0 - 400.0 K/uL 240.0 225 196.0   CMP Latest Ref Rng & Units 02/28/2017 02/28/2017 02/04/2017  Glucose 70 - 99 mg/dL - 100(H) -  BUN 6 - 23 mg/dL - 12 9  Creatinine 0.40 - 1.20 mg/dL - 0.78 0.9  Sodium 135 - 145 mEq/L - 137 142  Potassium 3.5 - 5.1 mEq/L - 3.5 4.2  Chloride 96 - 112 mEq/L - 101 -  CO2 19 - 32 mEq/L - 29 -    Calcium 8.6 - 10.2 mg/dL 9.7 9.7 -  Total Protein 6.0 - 8.3 g/dL - 8.1 -  Total Bilirubin 0.2 - 1.2 mg/dL - 1.1 -  Alkaline Phos 39 - 117 U/L - 75 -  AST 0 - 37 U/L - 18 15  ALT 0 - 35 U/L - 25 20    Imaging: Xr Ankle 2 Views Left  Result Date: 04/17/2017 No tibiotalar or subtalar joint space narrowing was noted. A small calcaneal spur was noted. Impression: Unremarkable x-ray of the ankle joint.  Xr Foot 2 Views Left  Result Date: 04/17/2017 No MTP PIP DIP narrowing was noted. No intertarsal joint space narrowing was noted. Small calcaneal spur was noted. Impression: unremarkable x-ray of the foot except for small calcaneal spur.  Xr Hand 2 View Left  Result Date: 04/17/2017 Minimal PIP/DIP narrowing was noted. No MCP intercarpal radiocarpal changes were noted. No erosive changes were noted. Impression: Unremarkable x-ray of the hand.  Xr Hand 2 View Right  Result Date: 04/17/2017 Minimal PIP/DIP narrowing was noted. No MCP intercarpal radiocarpal changes were noted. No erosive changes were noted. Impression: Unremarkable x-ray of the hand.  Xr Knee 3 View Left  Result Date: 04/17/2017 Moderate patellofemoral narrowing with spurring was noted. Moderate medial compartment narrowing with medial and intercondylar osteophytes was noted. No chondrocalcinosis was noted. Impression: These findings are consistent with moderate osteoarthritis and moderate chondromalacia patella.  Xr Knee 3 View Right  Result Date: 04/17/2017 Moderate patellofemoral narrowing with spurring was noted. Moderate medial compartment narrowing with medial and intercondylar osteophytes was noted. No chondrocalcinosis was noted. Impression: These findings are consistent with moderate osteoarthritis and moderate chondromalacia patella.   Speciality Comments: No specialty comments available.    Procedures:  No procedures performed Allergies: Other   Assessment / Plan:     Visit Diagnoses: Polyarthralgia:  Patient complains of pain and discomfort in multiple joints. She had no synovitis on examination today. She has warmth and swelling in her left knee only. All autoimmune workup has been negative in the past as described above.  Pain in both hands - Plan: XR Hand 2 View Right, XR Hand 2 View Left. X-ray of bilateral hands was unremarkable.  Chronic pain of both knees she discomfort range of motion of bilateral knee joints and warmth and swelling in her left knee joint. Plan: XR KNEE 3 VIEW RIGHT, XR KNEE 3 VIEW LEFT. X-rays revealed moderate osteoarthritis and moderate chondromalacia patella. Detailed counseling was provided. Weight loss diet and exercise was discussed. A handout on knee joint exercises was given.  Primary osteoarthritis of both knees - h/o of visco supplementation and PRP injections in the past.  Degenerative tear of medial meniscus, left - h/o PRP injection. Patient opted for conservative therapy including physical therapy and weight loss. She was advised meniscal tear repair in the past by orthopedics.  Pain in left ankle and joints of left foot, no warmth swelling or effusion was noted. She gives history of intermittent plantar fasciitis in her left foot. Plan: XR Ankle 2 Views Left, XR Foot 2 Views Left. The x-ray was unremarkable.  Rash: Patient developed a rash on her left lower extremity about 2 years ago which was treated by dermatologist with prednisone and topical steroids and finally with intralesional steroid injections. She only has hyperpigmentation present there. She also had a skin biopsy. I do not have of results available. I've advised her to bring the results of her skin biopsy.  DDD (degenerative disc disease), cervical - With moderate spinal stenosis and foraminal stenosis most significant  at C6-7. MRI 2017  Factor V Leiden mutation ([facility])  Hx of thrombosis of lower extremity in pregnancy: Autoimmune workup at that time was negative.  History of  hyperlipidemia  Positive PPD - CXR normal per patient    Orders: Orders Placed This Encounter  Procedures  . XR Hand 2 View Right  . XR Hand 2 View Left  . XR KNEE 3 VIEW RIGHT  . XR KNEE 3 VIEW LEFT  . XR Ankle 2 Views Left  . XR Foot 2 Views Left   No orders of the defined types were placed in this encounter.   Face-to-face time spent with patient was 60 minutes. Greater than 50% of time was spent in counseling and coordination of care.  Follow-Up Instructions: Return for Polyarthralgia.   Bo Merino, MD  Note - This record has been created using Editor, commissioning.  Chart creation errors have been sought, but may not always  have been located. Such creation errors do not reflect on  the standard of medical care.

## 2017-04-15 ENCOUNTER — Encounter: Payer: Self-pay | Admitting: Neurology

## 2017-04-15 ENCOUNTER — Ambulatory Visit (INDEPENDENT_AMBULATORY_CARE_PROVIDER_SITE_OTHER): Payer: 59 | Admitting: Neurology

## 2017-04-15 VITALS — BP 100/70 | HR 76 | Ht 65.0 in | Wt 198.1 lb

## 2017-04-15 DIAGNOSIS — M5412 Radiculopathy, cervical region: Secondary | ICD-10-CM

## 2017-04-15 NOTE — Progress Notes (Signed)
Follow-up Visit   Date: 04/15/17    Rosemarie AxMaysi Balliet MRN: [id number] DOB: 06/22/1975   Interim History: Autumn Cruz is a 42 y.o. left-handed female with hyperlipidemia and history of DVT due to Factor V Leiden returning to the clinic for follow-up of left cervical radiculopathy.  The patient was accompanied to the clinic by self.  History of present illness: Starting around July 2018, she began having pressure over the left side of her neck and ear. She has severe pressure involving the left eye, gums, shoulder, and arms.  She also has an area of numbness across her left shoulder and into her left palmer surface of the hand. She had achy pain of the neck with reduced range of motion with neck with rotation.  Rest helps relieve her pain and activity exacerbates her pain.  She has constant neck pain, and pressure of the head is usually worse at night.  She had MRI brain and cervical spine which showed mild-moderate canal stenosis and biforaminal stenosis at C6-7.  Symptoms have been intermittent over the past 7 years, but in July, they became much more frequent and painful.  Around 2011, she also complains of left foot and sole numbness.   She had large DVT of the left leg around the same time.  [place].   UPDATE 04/15/2017:  She is here for follow-up visit of left arm pain. She completed physical therapy and felt that it has significantly helped (~70% improvement).  She went to [place] in December to see her father who was hospitalized, and since her travels, she feels that her left arm pain may be coming.  She continues to take tizanidine 2mg  at bedtime, only as needed.  She denies any numbness/tingling or weakness of the hands.   Medications:  Current Outpatient Medications on File Prior to Visit  Medication Sig Dispense Refill  . aspirin 81 MG tablet Take 81 mg by mouth daily.    Marland Kitchen. atorvastatin (LIPITOR) 40 MG tablet atorvastatin 40 mg tablet      . Diclofenac Sodium (PENNSAID) 2 % SOLN Place 2 application onto the skin 2 (two) times daily. 112 g 3  . meloxicam (MOBIC) 15 MG tablet Take 1 tablet (15 mg total) by mouth daily. 90 tablet 0  . Multiple Vitamin (MULTI-VITAMIN PO) Take by mouth.    . tizanidine (ZANAFLEX) 2 MG capsule Take 1 capsule (2 mg total) by mouth 2 (two) times daily as needed for muscle spasms. 60 capsule 3   No current facility-administered medications on file prior to visit.     Allergies:  Allergies  Allergen Reactions  . Other     Review of Systems:  CONSTITUTIONAL: No fevers, chills, night sweats, or weight loss.  EYES: No visual changes or eye pain ENT: No hearing changes.  No history of nose bleeds.   RESPIRATORY: No cough, wheezing and shortness of breath.   CARDIOVASCULAR: Negative for chest pain, and palpitations.   GI: Negative for abdominal discomfort, blood in stools or black stools.  No recent change in bowel habits.   GU:  No history of incontinence.   MUSCLOSKELETAL: No history of joint pain or swelling.  No myalgias.   SKIN: Negative for lesions, rash, and itching.   ENDOCRINE: Negative for cold or heat intolerance, polydipsia or goiter.   PSYCH:  No depression or anxiety symptoms.   NEURO: As Above.   Vital Signs:  BP 100/70   Pulse 76  Ht 5\' 5"  (1.651 m)   Wt 198 lb 2 oz (89.9 kg)   SpO2 98%   BMI 32.97 kg/m    General: Well appearing, comfortable  Neurological Exam: MENTAL STATUS including orientation to time, place, person, recent and remote memory, attention span and concentration, language, and fund of knowledge is normal.  Speech is not dysarthric.  CRANIAL NERVES:   Pupils equal round and reactive to light.  Normal conjugate, extra-ocular eye movements in all directions of gaze.  No ptosis. Face is symmetric. Palate elevates symmetrically.  Tongue is midline.  MOTOR:  Motor strength is 5/5 in all extremities.  No pronator drift.  Tone is normal.    MSRs:  Right                                                                  Left brachioradialis 2+  brachioradialis 2+  biceps 1+  biceps 1+  triceps 2+  triceps 2+  patellar 2+  patellar 2+  ankle jerk 2+  ankle jerk 2+  Hoffman no  Hoffman no   SENSORY:  Intact to vibration throughout.  COORDINATION/GAIT:  Gait is mildly antalgic because of left > right knee pain, unassisted  Data: MRI brain 12/06/2016:  No acute intracranial process.  Mildly expanded partial empty sella MRI cervical spine 12/06/2016:  Mild multilevel degenerative changes. Mild to moderate canal stenosis and biforaminal stenosis at C6-7.  IMPRESSION/PLAN: 1.  Left C7 radiculopathy, she has marked improvement with neck physiotherapy and would like to continue this, which is reasonable.  I also encouraged her to do home exercises as they have provided.   She can continue tizanidine 2mg  qhs prn.    2.  Long history of left foot numbness, unclear etiology with negative prior work-up.    3.  Factor V Leiden, history of extensive left leg DVT followed by PCP  Return to clinic in 6 months  Thank you for allowing me to participate in patient's care.  If I can answer any additional questions, I would be pleased to do so.    Sincerely,    Donika K. Allena Katz, DO

## 2017-04-15 NOTE — Patient Instructions (Addendum)
Continue physical therapy and tizanidine 2mg  at bedtime as needed  Return to clinic in 6 months

## 2017-04-17 ENCOUNTER — Ambulatory Visit (INDEPENDENT_AMBULATORY_CARE_PROVIDER_SITE_OTHER): Payer: Self-pay

## 2017-04-17 ENCOUNTER — Ambulatory Visit (INDEPENDENT_AMBULATORY_CARE_PROVIDER_SITE_OTHER): Payer: 59 | Admitting: Rheumatology

## 2017-04-17 ENCOUNTER — Encounter: Payer: Self-pay | Admitting: Rheumatology

## 2017-04-17 VITALS — BP 118/74 | HR 69 | Resp 18 | Ht 65.5 in | Wt 201.0 lb

## 2017-04-17 DIAGNOSIS — M25562 Pain in left knee: Secondary | ICD-10-CM

## 2017-04-17 DIAGNOSIS — Z8639 Personal history of other endocrine, nutritional and metabolic disease: Secondary | ICD-10-CM | POA: Diagnosis not present

## 2017-04-17 DIAGNOSIS — D6851 Activated protein C resistance: Secondary | ICD-10-CM | POA: Diagnosis not present

## 2017-04-17 DIAGNOSIS — M5030 Other cervical disc degeneration, unspecified cervical region: Secondary | ICD-10-CM

## 2017-04-17 DIAGNOSIS — G8929 Other chronic pain: Secondary | ICD-10-CM

## 2017-04-17 DIAGNOSIS — M79642 Pain in left hand: Secondary | ICD-10-CM

## 2017-04-17 DIAGNOSIS — R21 Rash and other nonspecific skin eruption: Secondary | ICD-10-CM

## 2017-04-17 DIAGNOSIS — R7611 Nonspecific reaction to tuberculin skin test without active tuberculosis: Secondary | ICD-10-CM | POA: Diagnosis not present

## 2017-04-17 DIAGNOSIS — M2550 Pain in unspecified joint: Secondary | ICD-10-CM | POA: Diagnosis not present

## 2017-04-17 DIAGNOSIS — M79641 Pain in right hand: Secondary | ICD-10-CM

## 2017-04-17 DIAGNOSIS — M25572 Pain in left ankle and joints of left foot: Secondary | ICD-10-CM | POA: Diagnosis not present

## 2017-04-17 DIAGNOSIS — M170 Bilateral primary osteoarthritis of knee: Secondary | ICD-10-CM

## 2017-04-17 DIAGNOSIS — M25561 Pain in right knee: Secondary | ICD-10-CM

## 2017-04-17 DIAGNOSIS — M23203 Derangement of unspecified medial meniscus due to old tear or injury, right knee: Secondary | ICD-10-CM

## 2017-04-17 DIAGNOSIS — Z86718 Personal history of other venous thrombosis and embolism: Secondary | ICD-10-CM

## 2017-04-17 NOTE — Patient Instructions (Signed)

## 2017-04-22 ENCOUNTER — Ambulatory Visit: Payer: 59 | Admitting: Neurology

## 2017-04-25 DIAGNOSIS — M6281 Muscle weakness (generalized): Secondary | ICD-10-CM | POA: Diagnosis not present

## 2017-04-25 DIAGNOSIS — M542 Cervicalgia: Secondary | ICD-10-CM | POA: Diagnosis not present

## 2017-04-25 DIAGNOSIS — M79602 Pain in left arm: Secondary | ICD-10-CM | POA: Diagnosis not present

## 2017-05-07 DIAGNOSIS — M79602 Pain in left arm: Secondary | ICD-10-CM | POA: Diagnosis not present

## 2017-05-07 DIAGNOSIS — M6281 Muscle weakness (generalized): Secondary | ICD-10-CM | POA: Diagnosis not present

## 2017-05-07 DIAGNOSIS — M542 Cervicalgia: Secondary | ICD-10-CM | POA: Diagnosis not present

## 2017-05-13 ENCOUNTER — Ambulatory Visit: Payer: Self-pay

## 2017-05-13 NOTE — Telephone Encounter (Signed)
Daughter tested positive flu. Will try home remedies. Will call back if she feels worse.  Reason for Disposition . [1] Sore throat with cough/cold symptoms AND [2] present < 5 days  Answer Assessment - Initial Assessment Questions 1. ONSET: "When did the throat start hurting?" (Hours or days ago)      Started over the weekend 2. SEVERITY: "How bad is the sore throat?" (Scale 1-10; mild, moderate or severe)   - MILD (1-3):  doesn't interfere with eating or normal activities   - MODERATE (4-7): interferes with eating some solids and normal activities   - SEVERE (8-10):  excruciating pain, interferes with most normal activities   - SEVERE DYSPHAGIA: can't swallow liquids, drooling     Moderate 3. STREP EXPOSURE: "Has there been any exposure to strep within the past week?" If so, ask: "What type of contact occurred?"      No - daughter flu 4.  VIRAL SYMPTOMS: "Are there any symptoms of a cold, such as a runny nose, cough, hoarse voice or red eyes?"      Drainage from nose is green 5. FEVER: "Do you have a fever?" If so, ask: "What is your temperature, how was it measured, and when did it start?"     No 6. PUS ON THE TONSILS: "Is there pus on the tonsils in the back of your throat?"     No 7. OTHER SYMPTOMS: "Do you have any other symptoms?" (e.g., difficulty breathing, headache, rash)     Headache, cough 8. PREGNANCY: "Is there any chance you are pregnant?" "When was your last menstrual period?"     No  Protocols used: SORE THROAT-A-AH

## 2017-05-13 NOTE — Telephone Encounter (Signed)
See triage note.

## 2017-05-17 DIAGNOSIS — M79602 Pain in left arm: Secondary | ICD-10-CM | POA: Diagnosis not present

## 2017-05-17 DIAGNOSIS — M542 Cervicalgia: Secondary | ICD-10-CM | POA: Diagnosis not present

## 2017-05-17 DIAGNOSIS — M6281 Muscle weakness (generalized): Secondary | ICD-10-CM | POA: Diagnosis not present

## 2017-05-21 ENCOUNTER — Ambulatory Visit: Payer: Self-pay | Admitting: Rheumatology

## 2017-05-22 DIAGNOSIS — M170 Bilateral primary osteoarthritis of knee: Secondary | ICD-10-CM | POA: Insufficient documentation

## 2017-05-22 DIAGNOSIS — M5030 Other cervical disc degeneration, unspecified cervical region: Secondary | ICD-10-CM | POA: Insufficient documentation

## 2017-05-22 NOTE — Progress Notes (Signed)
Office Visit Note  Patient: Autumn Cruz             Date of Birth: 04-15-1975           MRN: [id number]             PCP: Burnis Medin, MD Referring: Burnis Medin, MD Visit Date: 05/27/2017 Occupation: '@GUAROCC' @    Subjective:  Knee pain.   History of Present Illness: Autumn Cruz is a 42 y.o. female with history of polyarthralgia, osteoarthritis of knee joints.  She states since the last visit she had some intentional weight loss which was helpful.  She has been also going to physical therapy which has helped her with her knee joint discomfort.  She is still has some discomfort in the left foot heel.  She has not had any recurrence and recurrence of plantar fasciitis.  Her neck pain is better.  Activities of Daily Living:  Patient reports morning stiffness for 15-25  minutes.   Patient Reports nocturnal pain.  Difficulty dressing/grooming: Reports Difficulty climbing stairs: Reports Difficulty getting out of chair: Reports Difficulty using hands for taps, buttons, cutlery, and/or writing: Denies   Review of Systems  Constitutional: Positive for weakness.  HENT: Negative for mouth sores.   Eyes: Negative for dryness.  Respiratory: Negative for difficulty breathing.   Cardiovascular: Negative for swelling in legs/feet.  Gastrointestinal: Negative for abdominal pain.  Endocrine: Positive for cold intolerance.  Genitourinary: Negative for pelvic pain.  Musculoskeletal: Positive for arthralgias, joint pain, joint swelling and morning stiffness.  Skin: Positive for rash.  Allergic/Immunologic: Negative for susceptible to infections.  Neurological: Negative for headaches.  Hematological: Negative for bruising/bleeding tendency.  Psychiatric/Behavioral: The patient is not nervous/anxious.     PMFS History:  Patient Active Problem List   Diagnosis Date Noted  . DDD (degenerative disc disease), cervical 05/22/2017  . Primary osteoarthritis of both knees 05/22/2017  .  Polyarthralgia 02/28/2017  . Degenerative tear of medial meniscus, right 04/09/2016  . Degenerative arthritis of knee, bilateral 01/31/2016  . Skin lesion of left leg 05/30/2014  . Hx gestational diabetes 02/04/2013  . Visit for preventive health examination 02/04/2013  . Long term (current) use of anticoagulants 09/29/2012  . Family history of premature CAD 09/24/2012  . Hyperlipidemia 09/24/2012  . Hx of thrombosis of lower extremity in pregnancy 09/24/2012  . Hypercoagulable state ([place]) 09/24/2012  . Factor V Leiden mutation ([place]) 09/24/2012  . Elevated C-reactive protein (CRP) 09/24/2012  . Post-phlebitic syndrome 09/24/2012    Past Medical History:  Diagnosis Date  . DVT (deep venous thrombosis) ([facility])   . Gestational diabetes   . High cholesterol   . Hx of thrombosis 2014   Left lower extrm.  Marland Kitchen Positive TB test   . Post-phlebitic syndrome 2014   Left  LE  . Thrombosis    Left leg preg tulip filter then removed.   . Tuberculosis    Positive test    Family History  Problem Relation Age of Onset  . Heart disease Mother        Blockage/Main artery  . Diabetes Mother   . Coronary artery disease Mother   . Hyperlipidemia Mother   . Hypertension Father   . Deep vein thrombosis Father   . Renal Disease Sister   . High Cholesterol Sister   . High Cholesterol Sister   . High Cholesterol Sister    Past Surgical History:  Procedure Laterality Date  . CESAREAN SECTION  2010 & 2012  Social History   Social History Narrative   Usually gets 6-7 hours of sleep per night   4 people living in the home.   Originally from [place] has a bachelor's degree some training in nursing and alternative medicine.   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   Negative tobacco alcohol some caffeine   Gravida 2 para 2 last Pap 2013   Think she had T. 2012     Objective: Vital  Signs: BP 111/76 (BP Location: Left Arm, Patient Position: Sitting, Cuff Size: Normal)   Pulse 76   Resp 18   Ht 5' 5.5" (1.664 m)   Wt 193 lb 8 oz (87.8 kg)   BMI 31.71 kg/m    Physical Exam  Constitutional: She is oriented to person, place, and time. She appears well-developed and well-nourished.  HENT:  Head: Normocephalic and atraumatic.  Eyes: Conjunctivae and EOM are normal.  Neck: Normal range of motion.  Cardiovascular: Normal rate, regular rhythm, normal heart sounds and intact distal pulses.  Pulmonary/Chest: Effort normal and breath sounds normal.  Abdominal: Soft. Bowel sounds are normal.  Lymphadenopathy:    She has no cervical adenopathy.  Neurological: She is alert and oriented to person, place, and time.  Skin: Skin is warm and dry. Capillary refill takes less than 2 seconds.  Hyperpigmentation noted on her left lower extremity.  Psychiatric: She has a normal mood and affect. Her behavior is normal.  Nursing note and vitals reviewed.    Musculoskeletal Exam: C-spine limited range of motion.  Thoracic and lumbar spine good range of motion.  Shoulder joints elbow joints wrist joint MCPs PIPs DIPs with good range of motion with no synovitis.  She had mild DIP thickening in her hands.  She is crepitus in her knee joints without any warmth swelling or effusion.  CDAI Exam: No CDAI exam completed.    Investigation: Findings:  MRI left knee joint March 28, 2016 from [place]: Showed mild osteoarthritis and minimal joint effusion. MRI right knee joint March 28, 2016 from [place] showed: Posterior root tear of medial meniscus.  Osteoarthritis in tibiofemoral joint and mild joint effusion. Skin biopsy December 28, 2015 right by Dr. Eustaquio Boyden: Diagnosis- lichenoid dermatitis.  CBC Latest Ref Rng & Units 02/28/2017 08/29/2016 12/20/2015  WBC 4.0 - 10.5 K/uL 8.5 7.1 7.0  Hemoglobin 12.0 - 15.0 g/dL 14.5 14.3 13.3  Hematocrit 36.0 - 46.0 % 43.0 43 38.8  Platelets  150.0 - 400.0 K/uL 240.0 225 196.0  CMP normal, uric acid 5.5, intact PTH normal, TSH normal , Vit D 50 Anti-CCP negative, rheumatoid factor negative, ANA negative, ESR 28, CRP normal  Imaging: No results found.  Speciality Comments: No specialty comments available.    Procedures:  No procedures performed Allergies: Other   Assessment / Plan:     Visit Diagnoses: Polyarthralgia: Her polyarthralgias have improved.  Primary osteoarthritis of both knees - Bilateral moderate with moderate chondromalacia patella.  Status post Visco supplement and PRP injections.  She brought MRI reports from [place] which I reviewed today.  They were consistent with meniscal tear and mild osteoarthritis and chondromalacia patella.  She has noticed improvement in her knee joints since she is doing physical therapy.  Degenerative tear of medial meniscus, right  Plantar fasciitis - History of recurrent plantar fasciitis.  The stretching exercises have been helpful.  Proper fitting shoes were discussed.  DDD (degenerative disc disease), cervical - Moderate spinal stenosis and foraminal stenosis C6-C7 on MRI 2017.  She has chronic discomfort.  Rash - History of rash on lower extremity for the last 2 years.  Followed up by dermatology.  The skin biopsy report: Lichenoid dermatitis.  She has had no relief from the treatment so far.  She requested dermatology referral.  I will refer her to dermatology per her request.  Factor V Leiden mutation ([facility])  Hx of thrombosis of lower extremity in pregnancy - Autoimmune workup was negative  Mixed hyperlipidemia  History of positive PPD - Chest x-ray normal per patient    Orders: Orders Placed This Encounter  Procedures  . Ambulatory referral to Dermatology   No orders of the defined types were placed in this encounter.   Face-to-face time spent with patient was 30 minutes.  Greater than 50% of time was spent in counseling and coordination of care.  Follow-Up  Instructions: Return in about 6 months (around 11/24/2017) for Osteoarthritis,DDD.   Bo Merino, MD  Note - This record has been created using Editor, commissioning.  Chart creation errors have been sought, but may not always  have been located. Such creation errors do not reflect on  the standard of medical care.

## 2017-05-27 ENCOUNTER — Ambulatory Visit (INDEPENDENT_AMBULATORY_CARE_PROVIDER_SITE_OTHER): Payer: 59 | Admitting: Rheumatology

## 2017-05-27 ENCOUNTER — Encounter: Payer: Self-pay | Admitting: Rheumatology

## 2017-05-27 VITALS — BP 111/76 | HR 76 | Resp 18 | Ht 65.5 in | Wt 193.5 lb

## 2017-05-27 DIAGNOSIS — R21 Rash and other nonspecific skin eruption: Secondary | ICD-10-CM

## 2017-05-27 DIAGNOSIS — R7611 Nonspecific reaction to tuberculin skin test without active tuberculosis: Secondary | ICD-10-CM

## 2017-05-27 DIAGNOSIS — M2550 Pain in unspecified joint: Secondary | ICD-10-CM | POA: Diagnosis not present

## 2017-05-27 DIAGNOSIS — Z9289 Personal history of other medical treatment: Secondary | ICD-10-CM

## 2017-05-27 DIAGNOSIS — M722 Plantar fascial fibromatosis: Secondary | ICD-10-CM

## 2017-05-27 DIAGNOSIS — M170 Bilateral primary osteoarthritis of knee: Secondary | ICD-10-CM | POA: Diagnosis not present

## 2017-05-27 DIAGNOSIS — M23203 Derangement of unspecified medial meniscus due to old tear or injury, right knee: Secondary | ICD-10-CM

## 2017-05-27 DIAGNOSIS — D6851 Activated protein C resistance: Secondary | ICD-10-CM

## 2017-05-27 DIAGNOSIS — M5030 Other cervical disc degeneration, unspecified cervical region: Secondary | ICD-10-CM | POA: Diagnosis not present

## 2017-05-27 DIAGNOSIS — E782 Mixed hyperlipidemia: Secondary | ICD-10-CM | POA: Diagnosis not present

## 2017-05-27 DIAGNOSIS — Z86718 Personal history of other venous thrombosis and embolism: Secondary | ICD-10-CM

## 2017-05-30 DIAGNOSIS — L433 Subacute (active) lichen planus: Secondary | ICD-10-CM | POA: Diagnosis not present

## 2017-05-30 DIAGNOSIS — L810 Postinflammatory hyperpigmentation: Secondary | ICD-10-CM | POA: Diagnosis not present

## 2017-06-01 ENCOUNTER — Encounter: Payer: Self-pay | Admitting: Family Medicine

## 2017-06-01 ENCOUNTER — Ambulatory Visit (INDEPENDENT_AMBULATORY_CARE_PROVIDER_SITE_OTHER): Payer: 59 | Admitting: Family Medicine

## 2017-06-01 VITALS — BP 106/78 | HR 91 | Temp 98.2°F | Wt 191.0 lb

## 2017-06-01 DIAGNOSIS — J0100 Acute maxillary sinusitis, unspecified: Secondary | ICD-10-CM

## 2017-06-01 DIAGNOSIS — J029 Acute pharyngitis, unspecified: Secondary | ICD-10-CM

## 2017-06-01 LAB — POCT RAPID STREP A (OFFICE): RAPID STREP A SCREEN: NEGATIVE

## 2017-06-01 MED ORDER — AMOXICILLIN 500 MG PO CAPS: 500.0000 mg | ORAL_CAPSULE | Freq: Three times a day (TID) | ORAL | 0 refills | Status: DC

## 2017-06-01 MED ORDER — BENZONATATE 100 MG PO CAPS: 200.0000 mg | ORAL_CAPSULE | Freq: Two times a day (BID) | ORAL | 0 refills | Status: DC | PRN

## 2017-06-01 NOTE — Patient Instructions (Signed)
Flu test is negative.   Rest, hydrate. Advil/tylenol for pain or fever + flonase, mucinex (DM if cough), nettie pot or nasal saline.  amoxicillin and tessalon perles prescribed, take until completed.  If cough present it can last up to 6-8 weeks.  F/U 2 weeks of not improved.    Sinusitis, Adult Sinusitis is soreness and inflammation of your sinuses. Sinuses are hollow spaces in the bones around your face. They are located:  Around your eyes.  In the middle of your forehead.  Behind your nose.  In your cheekbones.  Your sinuses and nasal passages are lined with a stringy fluid (mucus). Mucus normally drains out of your sinuses. When your nasal tissues get inflamed or swollen, the mucus can get trapped or blocked so air cannot flow through your sinuses. This lets bacteria, viruses, and funguses grow, and that leads to infection. Follow these instructions at home: Medicines  Take, use, or apply over-the-counter and prescription medicines only as told by your doctor. These may include nasal sprays.  If you were prescribed an antibiotic medicine, take it as told by your doctor. Do not stop taking the antibiotic even if you start to feel better. Hydrate and Humidify  Drink enough water to keep your pee (urine) clear or pale yellow.  Use a cool mist humidifier to keep the humidity level in your home above 50%.  Breathe in steam for 10-15 minutes, 3-4 times a day or as told by your doctor. You can do this in the bathroom while a hot shower is running.  Try not to spend time in cool or dry air. Rest  Rest as much as possible.  Sleep with your head raised (elevated).  Make sure to get enough sleep each night. General instructions  Put a warm, moist washcloth on your face 3-4 times a day or as told by your doctor. This will help with discomfort.  Wash your hands often with soap and water. If there is no soap and water, use hand sanitizer.  Do not smoke. Avoid being around  people who are smoking (secondhand smoke).  Keep all follow-up visits as told by your doctor. This is important. Contact a doctor if:  You have a fever.  Your symptoms get worse.  Your symptoms do not get better within 10 days. Get help right away if:  You have a very bad headache.  You cannot stop throwing up (vomiting).  You have pain or swelling around your face or eyes.  You have trouble seeing.  You feel confused.  Your neck is stiff.  You have trouble breathing. This information is not intended to replace advice given to you by your health care provider. Make sure you discuss any questions you have with your health care provider. Document Released: 09/05/2007 Document Revised: 11/13/2015 Document Reviewed: 01/12/2015 Elsevier Interactive Patient Education  [employer]2018 Elsevier Inc.

## 2017-06-01 NOTE — Progress Notes (Signed)
Autumn Cruz , 29-Apr-1975, 42 y.o., female MRN: [id number] Patient Care Team    Relationship Specialty Notifications Start End  Panosh, Neta Mends, MD PCP - General Internal Medicine  10/29/12   Levert Feinstein, MD Consulting Physician Oncology  02/04/13   Sherren Kerns, MD Consulting Physician Vascular Surgery  02/04/13   Carrington Clamp, MD Consulting Physician Obstetrics and Gynecology  05/28/14     Chief Complaint  Patient presents with  . Cough    x 3 days... nonproductive... pt has tried OTC Tylenol cold and decongestant and Delsym w/ minimal relief  . Sore Throat  . Generalized Body Aches    temp of 99.0-100  . Headache  . Nasal Congestion    x 1 day--yellow     Subjective: Pt presents for an OV with complaints of cough of 3 days duration.  Associated symptoms include sore throat, fever, headache, nasal congestion sinus pressure. Pt has tried tylenol and decongestant to ease their symptoms. Her daughter had the flu a few weeks ago. She has not had her flu shot this year.  She is tolerating PO.   Depression screen [facility] 2/9 07/16/2016 02/13/2016 02/13/2016 01/23/2016 07/18/2015  Decreased Interest 0 0 0 0 0  Down, Depressed, Hopeless 0 0 0 0 0  PHQ - 2 Score 0 0 0 0 0    No Known Allergies Social History   Tobacco Use  . Smoking status: Never Smoker  . Smokeless tobacco: Never Used  Substance Use Topics  . Alcohol use: No    Alcohol/week: 0.0 oz   Past Medical History:  Diagnosis Date  . DVT (deep venous thrombosis) ([facility])   . Gestational diabetes   . High cholesterol   . Hx of thrombosis 2014   Left lower extrm.  Marland Kitchen Positive TB test   . Post-phlebitic syndrome 2014   Left  LE  . Thrombosis    Left leg preg tulip filter then removed.   . Tuberculosis    Positive test   Past Surgical History:  Procedure Laterality Date  . CESAREAN SECTION  2010 & 2012   Family History  Problem Relation Age of Onset  . Heart disease Mother        Blockage/Main artery   . Diabetes Mother   . Coronary artery disease Mother   . Hyperlipidemia Mother   . Hypertension Father   . Deep vein thrombosis Father   . Renal Disease Sister   . High Cholesterol Sister   . High Cholesterol Sister   . High Cholesterol Sister    Allergies as of 06/01/2017   No Known Allergies     Medication List        Accurate as of 06/01/17 11:21 AM. Always use your most recent med list.          aspirin 81 MG tablet Take 81 mg by mouth daily.   atorvastatin 40 MG tablet Commonly known as:  LIPITOR atorvastatin 40 mg tablet   Diclofenac Sodium 2 % Soln Commonly known as:  PENNSAID Place 2 application onto the skin 2 (two) times daily.   LOVENOX IJ Inject as directed as needed (only when traveling).   meloxicam 15 MG tablet Commonly known as:  MOBIC Take 1 tablet (15 mg total) by mouth daily.   MULTI-VITAMIN PO Take by mouth.   tizanidine 2 MG capsule Commonly known as:  ZANAFLEX Take 1 capsule (2 mg total) by mouth 2 (two) times daily as needed for muscle spasms.  All past medical history, surgical history, allergies, family history, immunizations andmedications were updated in the EMR today and reviewed under the history and medication portions of their EMR.     ROS: Negative, with the exception of above mentioned in HPI   Objective:  BP 106/78   Pulse 91   Temp 98.2 F (36.8 C) (Oral)   Wt 191 lb (86.6 kg)   SpO2 98%   BMI 31.30 kg/m  Body mass index is 31.3 kg/m. Gen: Afebrile. No acute distress. Nontoxic in appearance, well developed, well nourished.  HENT: AT. [place]. Bilateral TM visualized without erythema or fullness. MMM, no oral lesions. Bilateral nares with erythema and drainage. Throat without erythema or exudates. Cough on exam. TTP max sinus.  Eyes:Pupils Equal Round Reactive to light, Extraocular movements intact,  Conjunctiva without redness, discharge or icterus. Neck/lymp/endocrine: Supple,no lymphadenopathy CV: RRR  Chest:  CTAB, no wheeze or crackles. Good air movement, normal resp effort.  Abd: Soft. NTND. BS present. no Masses palpated. No rebound or guarding.  Skin: no rashes, purpura or petechiae.  Neuro:  Normal gait. PERLA. EOMi. Alert. Oriented x3  No exam data present No results found. No results found for this or any previous visit (from the past 24 hour(s)).  Assessment/Plan: Jezel Keeny is a 42 y.o. female present for OV for  Acute non-recurrent maxillary sinusitis Sore throat - Rapid Strep A- negative - flu negative  Rest, hydrate.  + flonase, mucinex (DM if cough), nettie pot or nasal saline.  Tessalon perles and amox TID prescribed, take until completed.  If cough present it can last up to 6-8 weeks.  F/U 2 weeks of not improved.    Reviewed expectations re: course of current medical issues.  Discussed self-management of symptoms.  Outlined signs and symptoms indicating need for more acute intervention.  Patient verbalized understanding and all questions were answered.  Patient received an After-Visit Summary.    No orders of the defined types were placed in this encounter.    Note is dictated utilizing voice recognition software. Although note has been proof read prior to signing, occasional typographical errors still can be missed. If any questions arise, please do not hesitate to call for verification.   electronically signed by:  Felix Pacinienee Mariesha Venturella, DO  [facility] - [place]

## 2017-06-19 ENCOUNTER — Other Ambulatory Visit: Payer: Self-pay | Admitting: *Deleted

## 2017-06-19 MED ORDER — MELOXICAM 15 MG PO TABS: 15.0000 mg | ORAL_TABLET | ORAL | 0 refills | Status: DC | PRN

## 2017-07-02 ENCOUNTER — Telehealth: Payer: Self-pay | Admitting: Internal Medicine

## 2017-07-02 NOTE — Telephone Encounter (Signed)
Please refill as  requested  She has seen dr Frederich ChickGranfotuna in past and has a regimen  Of Lovenox when she travels  thanks

## 2017-07-02 NOTE — Telephone Encounter (Signed)
Its been 2016 since Lovenox last prescribed by Dr Fabian SharpPanosh.  I do not see in Epic where anyone else has been following this and prescribing   Medication: Enoxaparin Sodium (LOVENOX IJ) 40mg  - pt is going out of the [place] and needs to take with her - she leaves on 07/14/17 - please send in 2 boxes (#10 per box) - used to prevent blood clots

## 2017-07-02 NOTE — Telephone Encounter (Signed)
Copied from CRM [phone]. Topic: Quick Communication - Rx Refill/Question >> Jul 02, 2017  2:02 PM Maia Pettiesrtiz, Kristie S wrote: Medication: Enoxaparin Sodium (LOVENOX IJ) 40mg  - [place] and needs to take with her - she leaves on 07/14/17 - please send in 2 boxes (#10 per box) - used to prevent blood clots Has the patient contacted their pharmacy? no Preferred Pharmacy (with phone number or street name): [id number]- [place], [place] - [address] [phone] (Phone) [phone] (Fax)

## 2017-07-05 MED ORDER — ENOXAPARIN SODIUM 40 MG/0.4ML ~~LOC~~ SOLN: 40.0000 mg | SUBCUTANEOUS | 0 refills | Status: DC

## 2017-07-05 NOTE — Telephone Encounter (Signed)
Pt aware that we are refilling her Lovenox 40mg  Rx.  Verified dosing and qty with the patient.  Sent in for 20 syringes with 0 refills.   Nothing further needed.

## 2017-07-10 ENCOUNTER — Ambulatory Visit: Payer: Self-pay

## 2017-07-10 ENCOUNTER — Encounter: Payer: Self-pay | Admitting: Family Medicine

## 2017-07-10 ENCOUNTER — Ambulatory Visit (INDEPENDENT_AMBULATORY_CARE_PROVIDER_SITE_OTHER): Payer: 59 | Admitting: Family Medicine

## 2017-07-10 VITALS — BP 122/82 | HR 79 | Ht 65.5 in | Wt 188.0 lb

## 2017-07-10 DIAGNOSIS — M79672 Pain in left foot: Secondary | ICD-10-CM | POA: Diagnosis not present

## 2017-07-10 DIAGNOSIS — M722 Plantar fascial fibromatosis: Secondary | ICD-10-CM

## 2017-07-10 MED ORDER — GABAPENTIN 100 MG PO CAPS: 200.0000 mg | ORAL_CAPSULE | Freq: Every day | ORAL | 3 refills | Status: DC

## 2017-07-10 NOTE — Assessment & Plan Note (Signed)
Appears to have fibromatosis.  100 over the calcaneal region.  We discussed over-the-counter orthotics, we discussed offloading the area.  Discussed proper shoes.  Patient will be fitted for custom orthotics that I think will be beneficial as well.  Patient will follow up with me again 4 weeks

## 2017-07-10 NOTE — Progress Notes (Signed)
[name] [address] [place], [place] [address] Phone: [phone] Subjective:     CC: Knee pain and foot pain follow-up  BJY:[id number]  Autumn Cruz is a 42 y.o. female coming in with complaint of knee pain.  Found to have arthritic changes and has responded fairly well to Visco supplementation.  Last seen by me greater than 4 months ago.  At that time given PRP injection. States she has a bone spur in her left foot that has been bothering her. Going to [place] next week and plans to do a lot of walking. Has xrays.  2 months Heel pain Sitting to standing, when she starts walking pain increases, standing Ice, heat, oral meds        Past Medical History:  Diagnosis Date  . DVT (deep venous thrombosis) (HCC)   . Gestational diabetes   . High cholesterol   . Hx of thrombosis 2014   Left lower extrm.  Marland Kitchen Positive TB test   . Post-phlebitic syndrome 2014   Left  LE  . Thrombosis    Left leg preg tulip filter then removed.   . Tuberculosis    Positive test   Past Surgical History:  Procedure Laterality Date  . CESAREAN SECTION  2010 & 2012   Social History   Socioeconomic History  . Marital status: Married    Spouse name: [name]  . Number of children: 2  . Years of education: Not on file  . Highest education level: Not on file  Occupational History  . Occupation: Futures trader  Social Needs  . Financial resource strain: Not on file  . Food insecurity:    Worry: Not on file    Inability: Not on file  . Transportation needs:    Medical: Not on file    Non-medical: Not on file  Tobacco Use  . Smoking status: Never Smoker  . Smokeless tobacco: Never Used  Substance and Sexual Activity  . Alcohol use: No    Alcohol/week: 0.0 oz  . Drug use: No  . Sexual activity: Not on file  Lifestyle  . Physical activity:    Days per week: Not on file    Minutes per session: Not on file  . Stress: Not on file  Relationships  . Social  connections:    Talks on phone: Not on file    Gets together: Not on file    Attends religious service: Not on file    Active member of club or organization: Not on file    Attends meetings of clubs or organizations: Not on file    Relationship status: Not on file  Other Topics Concern  . Not on file  Social History Narrative   Usually gets 6-7 hours of sleep per night   4 people living in the home.   Originally from [place] has a bachelor's degree some training in nursing and alternative medicine.   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   Negative tobacco alcohol some caffeine   Gravida 2 para 2 last Pap 2013   Think she had T. 2012   No Known Allergies Family History  Problem Relation Age of Onset  . Heart disease Mother        Blockage/Main artery  . Diabetes Mother   . Coronary artery disease Mother   . Hyperlipidemia Mother   .  Hypertension Father   . Deep vein thrombosis Father   . Renal Disease Sister   . High Cholesterol Sister   . High Cholesterol Sister   . High Cholesterol Sister      Past medical history, social, surgical and family history all reviewed in electronic medical record.  No pertanent information unless stated regarding to the chief complaint.   Review of Systems:Review of systems updated and as accurate as of 07/10/17  No headache, visual changes, nausea, vomiting, diarrhea, constipation, dizziness, abdominal pain, skin rash, fevers, chills, night sweats, weight loss, swollen lymph nodes, body aches, joint swelling, muscle aches, chest pain, shortness of breath, mood changes.   Objective  Blood pressure 122/82, pulse 79, height 5' 5.5" (1.664 m), weight 188 lb (85.3 kg), SpO2 98 %. Systems examined below as of 07/10/17   General: No apparent distress alert and oriented x3 mood and affect normal, dressed appropriately.  HEENT: Pupils  equal, extraocular movements intact  Respiratory: Patient's speak in full sentences and does not appear short of breath  Cardiovascular: No lower extremity edema, non tender, no erythema  Skin: Warm dry intact with no signs of infection or rash on extremities or on axial skeleton.  Abdomen: Soft nontender  Neuro: Cranial nerves II through XII are intact, neurovascularly intact in all extremities with 2+ DTRs and 2+ pulses.  Lymph: No lymphadenopathy of posterior or anterior cervical chain or axillae bilaterally.  Gait normal with good balance and coordination.  MSK:  Non tender with full range of motion and good stability and symmetric strength and tone of shoulders, elbows, wrist, hip, and ankles bilaterally.  Knee still shows some mild arthritic changes.  Left foot exam shows breakdown of the longitudinal and transverse arch.  Spleen between the first and second toes.  Pain on the plantar aspect of the calcaneal region.  Minimal pain over the medial aspect and no pain over the Achilles itself.  Near full range of motion of the ankle.  Limited musculoskeletal ultrasound was performed and interpreted by Judi SaaZachary M Zanyah Lentsch  Limited ultrasound of patient's calcaneal region shows Patient does have what appears to be a fibromatosis of the plantar aspect of the foot.  Patient's plantar fascia is not enlarged.  No true calcaneal spur noted.  Otherwise fairly unremarkable Impression: Fibromatosis   Impression and Recommendations:     This case required medical decision making of moderate complexity.      Note: This dictation was prepared with Dragon dictation along with smaller phrase technology. Any transcriptional errors that result from this process are unintentional.

## 2017-07-10 NOTE — Patient Instructions (Addendum)
Good to see you  Ice is always your friend.  Spenco orthotics "total support" online would be great  We will order you cutom orthotics.  Gabapentin 200mg  at night pennsaid pinkie amount topically 2 times daily as needed.  Avoid being barefoot  Fibroma is what it is called.  If not better see me again in 3-4 weeks and lets consider injection.

## 2017-07-31 ENCOUNTER — Ambulatory Visit: Payer: 59 | Admitting: Family Medicine

## 2017-07-31 NOTE — Progress Notes (Deleted)
Procedure Note   Patient was fitted for a : standard, cushioned, semi-rigid orthotic. The orthotic was heated and afterward the patient patient seated position and molded The patient was positioned in subtalar neutral position and 10 degrees of ankle dorsiflexion in a weight bearing stance. After completion of molding, patient did have orthotic management The blank was ground to a stable position for weight bearing. Size: Base: Carbon fiber Additional Posting and Padding:  The patient ambulated these, and they were very comfortable.  

## 2017-08-02 ENCOUNTER — Ambulatory Visit (INDEPENDENT_AMBULATORY_CARE_PROVIDER_SITE_OTHER): Payer: 59 | Admitting: Family Medicine

## 2017-08-02 ENCOUNTER — Ambulatory Visit: Payer: 59 | Admitting: Family Medicine

## 2017-08-02 DIAGNOSIS — M722 Plantar fascial fibromatosis: Secondary | ICD-10-CM | POA: Diagnosis not present

## 2017-08-02 NOTE — Progress Notes (Signed)
Procedure Note   Patient was fitted for a : standard, cushioned, semi-rigid orthotic. The orthotic was heated and afterward the patient patient seated position and molded The patient was positioned in subtalar neutral position and 10 degrees of ankle dorsiflexion in a weight bearing stance. After completion of molding, patient did have orthotic management The blank was ground to a stable position for weight bearing. Size: 9 Base: Carbon fiber Additional Posting and Padding: Left: Medial 300/110 x2 heel 200/40 Right: 250/60 The patient ambulated these, and they were very comfortable.

## 2017-08-02 NOTE — Assessment & Plan Note (Signed)
Placed in orthotics today.  Discussed that this can take some time to get used to the wear.  Can make adjustments over the course the next 2 weeks.  Discussed icing regimen and home exercises.  Discussed which activities of doing which wants to avoid.  Patient will increase activity as tolerated.  Follow-up with me again 4 weeks

## 2017-08-06 ENCOUNTER — Other Ambulatory Visit: Payer: Self-pay | Admitting: Family Medicine

## 2017-08-06 NOTE — Telephone Encounter (Signed)
Refill done.  

## 2017-09-26 ENCOUNTER — Other Ambulatory Visit: Payer: Self-pay | Admitting: Family Medicine

## 2017-09-26 NOTE — Telephone Encounter (Signed)
Refill done.  

## 2017-10-15 DIAGNOSIS — H00012 Hordeolum externum right lower eyelid: Secondary | ICD-10-CM | POA: Diagnosis not present

## 2017-10-29 NOTE — Progress Notes (Signed)
Office Visit Note  Patient: Autumn Cruz             Date of Birth: 01/04/1976           MRN: [id number]             PCP: Madelin HeadingsPanosh, Wanda K, MD Referring: Madelin HeadingsPanosh, Wanda K, MD Visit Date: 11/01/2017 Occupation: @GUAROCC @  Subjective:  Right ischial bursitis   History of Present Illness: Autumn Cruz is a 42 y.o. female with history of osteoarthritis and DDD.  Patient reports today with symptoms of right ischial bursitis.  She has been experiencing tenderness in this region.  Her symptoms are aggravated by prolonged sitting.  She has to sleep on her left side at night so she does not have pressure on the right hip.  She denies any radiation of pain or numbness.  She describes the pain as a sense of pressure.  She continues to have chronic bilateral knee pain, worse in the left knee.  She states the pain is most severe with weather changes.  She has occasional joint swelling.  She takes Naproxen if the pain is severe and tylenol for mild pain.  She states she is unable to kneel when she is praying due to the severe pain.   She reports she continues to have left plantar fasciitis.  She denies having a cortisone injection in the past.  She reports wearing orthotics have helped.  She states she was diagnosed by ultrasound to have left plantar fascia fibromatosis. She denies any neck pain or neck stiffness at this time.    Activities of Daily Living:  Patient reports morning stiffness for 10 minutes.   Patient Reports nocturnal pain.  Difficulty dressing/grooming: Reports Difficulty climbing stairs: Reports Difficulty getting out of chair: Reports Difficulty using hands for taps, buttons, cutlery, and/or writing: Denies  Review of Systems  Constitutional: Positive for fatigue.  HENT: Negative for mouth sores, mouth dryness and nose dryness.   Eyes: Negative for pain, visual disturbance and dryness.  Respiratory: Negative for cough, hemoptysis, shortness of breath and difficulty breathing.     Cardiovascular: Positive for swelling in legs/feet. Negative for chest pain, palpitations and hypertension.  Gastrointestinal: Negative for blood in stool, constipation and diarrhea.  Endocrine: Negative for increased urination.  Genitourinary: Negative for painful urination.  Musculoskeletal: Positive for arthralgias, joint pain, myalgias, morning stiffness and myalgias. Negative for joint swelling, muscle weakness and muscle tenderness.  Skin: Negative for color change, pallor, rash, hair loss, nodules/bumps, skin tightness, ulcers and sensitivity to sunlight.  Allergic/Immunologic: Negative for susceptible to infections.  Neurological: Negative for dizziness, headaches and weakness.  Hematological: Negative for swollen glands.  Psychiatric/Behavioral: Positive for sleep disturbance. Negative for depressed mood. The patient is not nervous/anxious.     PMFS History:  Patient Active Problem List   Diagnosis Date Noted  . Fibromatosis, plantar 07/10/2017  . DDD (degenerative disc disease), cervical 05/22/2017  . Primary osteoarthritis of both knees 05/22/2017  . Polyarthralgia 02/28/2017  . Degenerative tear of medial meniscus, right 04/09/2016  . Degenerative arthritis of knee, bilateral 01/31/2016  . Skin lesion of left leg 05/30/2014  . Hx gestational diabetes 02/04/2013  . Visit for preventive health examination 02/04/2013  . Long term (current) use of anticoagulants 09/29/2012  . Family history of premature CAD 09/24/2012  . Hyperlipidemia 09/24/2012  . Hx of thrombosis of lower extremity in pregnancy 09/24/2012  . Hypercoagulable state (HCC) 09/24/2012  . Factor V Leiden mutation (HCC) 09/24/2012  . Elevated  C-reactive protein (CRP) 09/24/2012  . Post-phlebitic syndrome 09/24/2012    Past Medical History:  Diagnosis Date  . Arthritis   . DVT (deep venous thrombosis) (HCC)   . Gestational diabetes   . High cholesterol   . Hx of thrombosis 2014   Left lower extrm.  Marland Kitchen  Positive TB test   . Post-phlebitic syndrome 2014   Left  LE  . Thrombosis    Left leg preg tulip filter then removed.   . Tuberculosis    Positive test    Family History  Problem Relation Age of Onset  . Heart disease Mother        Blockage/Main artery  . Diabetes Mother   . Coronary artery disease Mother   . Hyperlipidemia Mother   . Hypertension Father   . Deep vein thrombosis Father   . Renal Disease Sister   . High Cholesterol Sister   . High Cholesterol Sister   . High Cholesterol Sister    Past Surgical History:  Procedure Laterality Date  . CESAREAN SECTION  2010 & 2012   Social History   Social History Narrative   Usually gets 6-7 hours of sleep per night   4 people living in the home.   Originally from [place] has a bachelor's degree some training in nursing and alternative medicine.   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   Negative tobacco alcohol some caffeine   Gravida 2 para 2 last Pap 2013   Think she had T. 2012    Objective: Vital Signs: BP 120/79 (BP Location: Left Arm, Patient Position: Sitting, Cuff Size: Normal)   Pulse 80   Ht 5' 5.5" (1.664 m)   Wt 192 lb (87.1 kg)   BMI 31.46 kg/m    Physical Exam  Constitutional: She is oriented to person, place, and time. She appears well-developed and well-nourished.  HENT:  Head: Normocephalic and atraumatic.  Eyes: Conjunctivae and EOM are normal.  Neck: Normal range of motion.  Cardiovascular: Normal rate, regular rhythm, normal heart sounds and intact distal pulses.  Pulmonary/Chest: Effort normal and breath sounds normal.  Abdominal: Soft. Bowel sounds are normal.  Lymphadenopathy:    She has no cervical adenopathy.  Neurological: She is alert and oriented to person, place, and time.  Skin: Skin is warm and dry. Capillary refill takes less than 2 seconds.  Psychiatric: She has a  normal mood and affect. Her behavior is normal.  Nursing note and vitals reviewed.    Musculoskeletal Exam: C-spine, thoracic spine, lumbar spine good range of motion.  No midline spinal tenderness.  No SI joint tenderness.  Right ischial tuberosity tenderness. Shoulder joints, elbow joints, wrist joints, MCPs, PIPs, DIPs good range of motion with no synovitis.  She is complete fist formation bilaterally.  Hip joints, knee joints, ankle joints, MTPs, PIPs, DIPs good range of motion with no synovitis.  No warmth or effusion of bilateral knee joints.  No tenderness of trochanteric bursa bilaterally. Tenderness of right plantar fascia.   CDAI Exam: No CDAI exam completed.   Investigation: No additional findings.  Imaging: No results found.  Recent Labs: Lab Results  Component Value Date   WBC 8.5 02/28/2017   HGB 14.5 02/28/2017   PLT 240.0 02/28/2017   NA 137 02/28/2017   K 3.5 02/28/2017   CL 101 02/28/2017   CO2 29 02/28/2017  GLUCOSE 100 (H) 02/28/2017   BUN 12 02/28/2017   CREATININE 0.78 02/28/2017   BILITOT 1.1 02/28/2017   ALKPHOS 75 02/28/2017   AST 18 02/28/2017   ALT 25 02/28/2017   PROT 8.1 02/28/2017   ALBUMIN 4.5 02/28/2017   CALCIUM 9.7 02/28/2017   CALCIUM 9.7 02/28/2017    Speciality Comments: No specialty comments available.  Procedures:  No procedures performed Allergies: Patient has no known allergies.   Assessment / Plan:     Visit Diagnoses:Primary osteoarthritis of both knees - Bilateral moderate with moderate chondromalacia patella.Bilateral moderate with moderate chondromalacia patella.  Status post Visco supplement and PRP inj: Chronic pain.  She is good range of motion.  No warmth or effusion noted.  She occasionally has mechanical symptoms in the left knee joint.  She notes occasional swelling especially if she standing for prolonged peers of time.  She cannot kneel well pain due to the pain.  DDD (degenerative disc disease), cervical -  Moderate spinal stenosis and foraminal stenosis C6-C7 on MRI 2017.  She has good range of motion with no discomfort at this time.  Plantar fasciitis -left plantar fasciitis-she continues to have tenderness on the left plantar fascia on exam.  She had ultrasound performed by Dr. Alveda Reasons that revealed plantar fascial fibromatosis.  She has been wearing orthotics which have helped.  She declined a cortisone injection.  She requested a referral to physical therapy.  Plan: Ambulatory referral to Physical Therapy  Ischial bursitis of right side -She has tenderness of the right ischial tuberosity.  Her symptoms are aggravated if she is sitting for prolonged peers of time.  We discussed the importance of changing positions frequently.  She was given a prescription for an initial tuberosity cushion.  She requested an x-ray to be performed in the office today to rule out a fracture.  Referral was placed to physical therapy for evaluation and treatment of right initial bursitis.  We also discussed alternating ice and heat for comfort.  Plan: Ambulatory referral to Physical Therapy, XR Pelvis 1-2 Views   Rash - History of rash on lower extremity for the last 2 years.  Followed up by dermatology.  The skin biopsy report: Lichenoid dermatitis.  She continues to use hydrocortisone cream.  She has not noticed any progression of the rash.  Factor V Leiden mutation (HCC)  History of positive PPD - Chest x-ray normal per patient   Hx of thrombosis of lower extremity in pregnancy - Autoimmune workup was negative  Mixed hyperlipidemia   Orders: Orders Placed This Encounter  Procedures  . XR Pelvis 1-2 Views  . Ambulatory referral to Physical Therapy   No orders of the defined types were placed in this encounter.   Face-to-face time spent with patient was 30 minutes. Greater than 50% of time was spent in counseling and coordination of care.  Follow-Up Instructions: Return in about 6 months (around 05/04/2018)  for Osteoarthritis, DDD.   Gearldine Bienenstock, PA-C I examined and evaluated the patient with Sherron Ales PA.  She has been having discomfort over her ischial bursa.  Her examination was consistent with ischial bursitis.  I demonstrated some of the exercises in the office today.  The x-ray obtained of the pelvis was unremarkable which was discussed with the patient.  The plan of care was discussed as noted above.  Pollyann Savoy, MD Note - This record has been created using Animal nutritionist.  Chart creation errors have been sought, but may not always  have  been located. Such creation errors do not reflect on  the standard of medical care.

## 2017-11-01 ENCOUNTER — Ambulatory Visit (INDEPENDENT_AMBULATORY_CARE_PROVIDER_SITE_OTHER): Payer: 59 | Admitting: Rheumatology

## 2017-11-01 ENCOUNTER — Ambulatory Visit (INDEPENDENT_AMBULATORY_CARE_PROVIDER_SITE_OTHER): Payer: Self-pay

## 2017-11-01 ENCOUNTER — Encounter: Payer: Self-pay | Admitting: Rheumatology

## 2017-11-01 VITALS — BP 120/79 | HR 80 | Ht 65.5 in | Wt 192.0 lb

## 2017-11-01 DIAGNOSIS — M2550 Pain in unspecified joint: Secondary | ICD-10-CM | POA: Diagnosis not present

## 2017-11-01 DIAGNOSIS — M722 Plantar fascial fibromatosis: Secondary | ICD-10-CM

## 2017-11-01 DIAGNOSIS — M5030 Other cervical disc degeneration, unspecified cervical region: Secondary | ICD-10-CM | POA: Diagnosis not present

## 2017-11-01 DIAGNOSIS — M7071 Other bursitis of hip, right hip: Secondary | ICD-10-CM | POA: Diagnosis not present

## 2017-11-01 DIAGNOSIS — Z86718 Personal history of other venous thrombosis and embolism: Secondary | ICD-10-CM

## 2017-11-01 DIAGNOSIS — E782 Mixed hyperlipidemia: Secondary | ICD-10-CM

## 2017-11-01 DIAGNOSIS — R7611 Nonspecific reaction to tuberculin skin test without active tuberculosis: Secondary | ICD-10-CM

## 2017-11-01 DIAGNOSIS — D6851 Activated protein C resistance: Secondary | ICD-10-CM

## 2017-11-01 DIAGNOSIS — M170 Bilateral primary osteoarthritis of knee: Secondary | ICD-10-CM | POA: Diagnosis not present

## 2017-11-01 DIAGNOSIS — R21 Rash and other nonspecific skin eruption: Secondary | ICD-10-CM

## 2017-11-01 DIAGNOSIS — Z9289 Personal history of other medical treatment: Secondary | ICD-10-CM

## 2017-11-22 ENCOUNTER — Telehealth: Payer: Self-pay | Admitting: Rheumatology

## 2017-11-22 NOTE — Telephone Encounter (Signed)
Refaxed referral x 2

## 2017-11-22 NOTE — Patient Instructions (Addendum)
Continue attention to  lifestyle intervention healthy eating and exercise . Get appt for fasting lab tests and will let you know when back . If all ok then yearly  Check up and lab .   Health Maintenance, Female Adopting a healthy lifestyle and getting preventive care can go a long way to promote health and wellness. Talk with your health care provider about what schedule of regular examinations is right for you. This is a good chance for you to check in with your provider about disease prevention and staying healthy. In between checkups, there are plenty of things you can do on your own. Experts have done a lot of research about which lifestyle changes and preventive measures are most likely to keep you healthy. Ask your health care provider for more information. Weight and diet Eat a healthy diet  Be sure to include plenty of vegetables, fruits, low-fat dairy products, and lean protein.  Do not eat a lot of foods high in solid fats, added sugars, or salt.  Get regular exercise. This is one of the most important things you can do for your health. ? Most adults should exercise for at least 150 minutes each week. The exercise should increase your heart rate and make you sweat (moderate-intensity exercise). ? Most adults should also do strengthening exercises at least twice a week. This is in addition to the moderate-intensity exercise.  Maintain a healthy weight  Body mass index (BMI) is a measurement that can be used to identify possible weight problems. It estimates body fat based on height and weight. Your health care provider can help determine your BMI and help you achieve or maintain a healthy weight.  For females 1 years of age and older: ? A BMI below 18.5 is considered underweight. ? A BMI of 18.5 to 24.9 is normal. ? A BMI of 25 to 29.9 is considered overweight. ? A BMI of 30 and above is considered obese.  Watch levels of cholesterol and blood lipids  You should start having  your blood tested for lipids and cholesterol at 43 years of age, then have this test every 5 years.  You may need to have your cholesterol levels checked more often if: ? Your lipid or cholesterol levels are high. ? You are older than 43 years of age. ? You are at high risk for heart disease.  Cancer screening Lung Cancer  Lung cancer screening is recommended for adults 79-68 years old who are at high risk for lung cancer because of a history of smoking.  A yearly low-dose CT scan of the lungs is recommended for people who: ? Currently smoke. ? Have quit within the past 15 years. ? Have at least a 30-pack-year history of smoking. A pack year is smoking an average of one pack of cigarettes a day for 1 year.  Yearly screening should continue until it has been 15 years since you quit.  Yearly screening should stop if you develop a health problem that would prevent you from having lung cancer treatment.  Breast Cancer  Practice breast self-awareness. This means understanding how your breasts normally appear and feel.  It also means doing regular breast self-exams. Let your health care provider know about any changes, no matter how small.  If you are in your 20s or 30s, you should have a clinical breast exam (CBE) by a health care provider every 1-3 years as part of a regular health exam.  If you are 52 or older, have a  CBE every year. Also consider having a breast X-ray (mammogram) every year.  If you have a family history of breast cancer, talk to your health care provider about genetic screening.  If you are at high risk for breast cancer, talk to your health care provider about having an MRI and a mammogram every year.  Breast cancer gene (BRCA) assessment is recommended for women who have family members with BRCA-related cancers. BRCA-related cancers include: ? Breast. ? Ovarian. ? Tubal. ? Peritoneal cancers.  Results of the assessment will determine the need for genetic  counseling and BRCA1 and BRCA2 testing.  Cervical Cancer Your health care provider may recommend that you be screened regularly for cancer of the pelvic organs (ovaries, uterus, and vagina). This screening involves a pelvic examination, including checking for microscopic changes to the surface of your cervix (Pap test). You may be encouraged to have this screening done every 3 years, beginning at age 77.  For women ages 60-65, health care providers may recommend pelvic exams and Pap testing every 3 years, or they may recommend the Pap and pelvic exam, combined with testing for human papilloma virus (HPV), every 5 years. Some types of HPV increase your risk of cervical cancer. Testing for HPV may also be done on women of any age with unclear Pap test results.  Other health care providers may not recommend any screening for nonpregnant women who are considered low risk for pelvic cancer and who do not have symptoms. Ask your health care provider if a screening pelvic exam is right for you.  If you have had past treatment for cervical cancer or a condition that could lead to cancer, you need Pap tests and screening for cancer for at least 20 years after your treatment. If Pap tests have been discontinued, your risk factors (such as having a new sexual partner) need to be reassessed to determine if screening should resume. Some women have medical problems that increase the chance of getting cervical cancer. In these cases, your health care provider may recommend more frequent screening and Pap tests.  Colorectal Cancer  This type of cancer can be detected and often prevented.  Routine colorectal cancer screening usually begins at 43 years of age and continues through 43 years of age.  Your health care provider may recommend screening at an earlier age if you have risk factors for colon cancer.  Your health care provider may also recommend using home test kits to check for hidden blood in the  stool.  A small camera at the end of a tube can be used to examine your colon directly (sigmoidoscopy or colonoscopy). This is done to check for the earliest forms of colorectal cancer.  Routine screening usually begins at age 68.  Direct examination of the colon should be repeated every 5-10 years through 43 years of age. However, you may need to be screened more often if early forms of precancerous polyps or small growths are found.  Skin Cancer  Check your skin from head to toe regularly.  Tell your health care provider about any new moles or changes in moles, especially if there is a change in a mole's shape or color.  Also tell your health care provider if you have a mole that is larger than the size of a pencil eraser.  Always use sunscreen. Apply sunscreen liberally and repeatedly throughout the day.  Protect yourself by wearing long sleeves, pants, a wide-brimmed hat, and sunglasses whenever you are outside.  Heart disease,  diabetes, and high blood pressure  High blood pressure causes heart disease and increases the risk of stroke. High blood pressure is more likely to develop in: ? People who have blood pressure in the high end of the normal range (130-139/85-89 mm Hg). ? People who are overweight or obese. ? People who are African American.  If you are 35-41 years of age, have your blood pressure checked every 3-5 years. If you are 68 years of age or older, have your blood pressure checked every year. You should have your blood pressure measured twice-once when you are at a hospital or clinic, and once when you are not at a hospital or clinic. Record the average of the two measurements. To check your blood pressure when you are not at a hospital or clinic, you can use: ? An automated blood pressure machine at a pharmacy. ? A home blood pressure monitor.  If you are between 62 years and 22 years old, ask your health care provider if you should take aspirin to prevent  strokes.  Have regular diabetes screenings. This involves taking a blood sample to check your fasting blood sugar level. ? If you are at a normal weight and have a low risk for diabetes, have this test once every three years after 43 years of age. ? If you are overweight and have a high risk for diabetes, consider being tested at a younger age or more often. Preventing infection Hepatitis B  If you have a higher risk for hepatitis B, you should be screened for this virus. You are considered at high risk for hepatitis B if: ? You were born in a country where hepatitis B is common. Ask your health care provider which countries are considered high risk. ? Your parents were born in a high-risk country, and you have not been immunized against hepatitis B (hepatitis B vaccine). ? You have HIV or AIDS. ? You use needles to inject street drugs. ? You live with someone who has hepatitis B. ? You have had sex with someone who has hepatitis B. ? You get hemodialysis treatment. ? You take certain medicines for conditions, including cancer, organ transplantation, and autoimmune conditions.  Hepatitis C  Blood testing is recommended for: ? Everyone born from [birth year] through 1966. ? Anyone with known risk factors for hepatitis C.  Sexually transmitted infections (STIs)  You should be screened for sexually transmitted infections (STIs) including gonorrhea and chlamydia if: ? You are sexually active and are younger than 43 years of age. ? You are older than 43 years of age and your health care provider tells you that you are at risk for this type of infection. ? Your sexual activity has changed since you were last screened and you are at an increased risk for chlamydia or gonorrhea. Ask your health care provider if you are at risk.  If you do not have HIV, but are at risk, it Michaelis be recommended that you take a prescription medicine daily to prevent HIV infection. This is called pre-exposure prophylaxis  (PrEP). You are considered at risk if: ? You are sexually active and do not regularly use condoms or know the HIV status of your partner(s). ? You take drugs by injection. ? You are sexually active with a partner who has HIV.  Talk with your health care provider about whether you are at high risk of being infected with HIV. If you choose to begin PrEP, you should first be tested for HIV. You should  then be tested every 3 months for as long as you are taking PrEP. Pregnancy  If you are premenopausal and you may become pregnant, ask your health care provider about preconception counseling.  If you may become pregnant, take 400 to 800 micrograms (mcg) of folic acid every day.  If you want to prevent pregnancy, talk to your health care provider about birth control (contraception). Osteoporosis and menopause  Osteoporosis is a disease in which the bones lose minerals and strength with aging. This can result in serious bone fractures. Your risk for osteoporosis can be identified using a bone density scan.  If you are 40 years of age or older, or if you are at risk for osteoporosis and fractures, ask your health care provider if you should be screened.  Ask your health care provider whether you should take a calcium or vitamin D supplement to lower your risk for osteoporosis.  Menopause may have certain physical symptoms and risks.  Hormone replacement therapy may reduce some of these symptoms and risks. Talk to your health care provider about whether hormone replacement therapy is right for you. Follow these instructions at home:  Schedule regular health, dental, and eye exams.  Stay current with your immunizations.  Do not use any tobacco products including cigarettes, chewing tobacco, or electronic cigarettes.  If you are pregnant, do not drink alcohol.  If you are breastfeeding, limit how much and how often you drink alcohol.  Limit alcohol intake to no more than 1 drink per day for  nonpregnant women. One drink equals 12 ounces of beer, 5 ounces of wine, or 1 ounces of hard liquor.  Do not use street drugs.  Do not share needles.  Ask your health care provider for help if you need support or information about quitting drugs.  Tell your health care provider if you often feel depressed.  Tell your health care provider if you have ever been abused or do not feel safe at home. This information is not intended to replace advice given to you by your health care provider. Make sure you discuss any questions you have with your health care provider. Document Released: 10/02/2010 Document Revised: 08/25/2015 Document Reviewed: 12/21/2014 Elsevier Interactive Patient Education  [employer].

## 2017-11-22 NOTE — Telephone Encounter (Signed)
[facility] has not received PT orders for patient as of yet. Please resend. Fa# [phone] Patient scheduled for next Wednesday.

## 2017-11-22 NOTE — Progress Notes (Signed)
No chief complaint on file.   HPI: Patient  Autumn Cruz  42 y.o. comes in today for Preventive Health Care visit  And Chronic disease management  has Family history of premature CAD; Hyperlipidemia; Hx of thrombosis of lower extremity in pregnancy; Hypercoagulable state ([place]); Factor V Leiden mutation ([place]); Elevated C-reactive protein (CRP); Post-phlebitic syndrome; Long term (current) use of anticoagulants; Hx gestational diabetes; Visit for preventive health examination; Skin lesion of left leg; Degenerative arthritis of knee, bilateral; Degenerative tear of medial meniscus, right; Polyarthralgia; DDD (degenerative disc disease), cervical; Primary osteoarthritis of both knees; and Fibromatosis, plantar on their problem list. Has seen dr August Luz Dr Shelda Jakes and ms  Sx  And her GYNE  No change in  Status Area left leg still no defined  Seen 2 derm and had bx unknown cause  Taking lipitor wihout problem  Dr Darnell Level    On as needed lovenox  Health Maintenance  Topic Date Due  . HIV Screening  07/24/1989  . INFLUENZA VACCINE  10/31/2017  . PAP SMEAR  08/30/2019  . TETANUS/TDAP  04/03/2020   Health Maintenance Review LIFESTYLE:  Exercise:  Walks and gardening      3 story hours .  Tobacco/ETS:  no Alcohol:   n Sugar beverages: no out  Sleep:about  5-7   Uses ear buds   Light sleeper  Drug use: no HH of  2= 2 kids  Fish  Work:  Not schedules  Traveling to [place]  End of year   ROS:  GEN/ HEENT: No fever, significant weight changes sweats headaches vision problems hearing changes, CV/ PULM; No chest pain shortness of breath cough, syncope,edema  change in exercise tolerance. GI /GU: No adominal pain, vomiting, change in bowel habits. No blood in the stool. No significant GU symptoms. SKIN/HEME: ,no NEW acute skin rashes suspicious lesions or bleeding. See above  No lymphadenopathy, nodules, masses.  NEURO/ PSYCH:  No neurologic signs such as weakness numbness. No depression  anxiety. IMM/ Allergy: No unusual infections.  Allergy .   REST of 12 system review negative except as per HPI   Past Medical History:  Diagnosis Date  . Arthritis   . DVT (deep venous thrombosis) ([place])   . Gestational diabetes   . High cholesterol   . Hx of thrombosis 2014   Left lower extrm.  Marland Kitchen Positive TB test   . Post-phlebitic syndrome 2014   Left  LE  . Thrombosis    Left leg preg tulip filter then removed.   . Tuberculosis    Positive test    Past Surgical History:  Procedure Laterality Date  . CESAREAN SECTION  2010 & 2012    Family History  Problem Relation Age of Onset  . Heart disease Mother        Blockage/Main artery  . Diabetes Mother   . Coronary artery disease Mother   . Hyperlipidemia Mother   . Hypertension Father   . Deep vein thrombosis Father   . Renal Disease Sister   . High Cholesterol Sister   . High Cholesterol Sister   . High Cholesterol Sister     Social History   Socioeconomic History  . Marital status: Married    Spouse name: [name]  . Number of children: 2  . Years of education: Not on file  . Highest education level: Not on file  Occupational History  . Occupation: Agricultural engineer  Social Needs  . Financial resource strain: Not on file  .  Food insecurity:    Worry: Not on file    Inability: Not on file  . Transportation needs:    Medical: Not on file    Non-medical: Not on file  Tobacco Use  . Smoking status: Never Smoker  . Smokeless tobacco: Never Used  Substance and Sexual Activity  . Alcohol use: No    Alcohol/week: 0.0 standard drinks  . Drug use: No  . Sexual activity: Not on file  Lifestyle  . Physical activity:    Days per week: Not on file    Minutes per session: Not on file  . Stress: Not on file  Relationships  . Social connections:    Talks on phone: Not on file    Gets together: Not on file    Attends religious service: Not on file    Active member of club or organization: Not on file    Attends  meetings of clubs or organizations: Not on file    Relationship status: Not on file  Other Topics Concern  . Not on file  Social History Narrative   Usually gets 6-7 hours of sleep per night   4 people living in the home.   Originally from [place] has a bachelor's degree some training in nursing and alternative medicine.   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   Negative tobacco alcohol some caffeine   Gravida 2 para 2 last Pap 2013   Think she had T. 2012    Outpatient Medications Prior to Visit  Medication Sig Dispense Refill  . aspirin 81 MG tablet Take 81 mg by mouth daily.    Marland Kitchen atorvastatin (LIPITOR) 40 MG tablet atorvastatin 40 mg tablet    . Diclofenac Sodium (PENNSAID) 2 % SOLN Place 2 application onto the skin 2 (two) times daily. 112 g 3  . Enoxaparin Sodium (LOVENOX IJ) Inject as directed as needed (only when traveling).     . gabapentin (NEURONTIN) 100 MG capsule TAKE 2 CAPSULES BY MOUTH AT BEDTIME. 180 capsule 2  . meloxicam (MOBIC) 15 MG tablet TAKE 1 TABLET BY MOUTH AS NEEDED 90 tablet 0  . Multiple Vitamin (MULTI-VITAMIN PO) Take by mouth.    . tizanidine (ZANAFLEX) 2 MG capsule Take 1 capsule (2 mg total) by mouth 2 (two) times daily as needed for muscle spasms. 60 capsule 3  . amoxicillin (AMOXIL) 500 MG capsule Take 1 capsule (500 mg total) by mouth 3 (three) times daily. (Patient not taking: Reported on 11/25/2017) 30 capsule 0  . benzonatate (TESSALON) 100 MG capsule Take 2 capsules (200 mg total) by mouth 2 (two) times daily as needed for cough. (Patient not taking: Reported on 11/25/2017) 20 capsule 0  . enoxaparin (LOVENOX) 40 MG/0.4ML injection Inject 0.4 mLs (40 mg total) into the skin daily. (Patient not taking: Reported on 11/25/2017) 20 Syringe 0   No facility-administered medications prior to visit.      EXAM:  BP 116/68 (BP Location: Right Arm,  Patient Position: Sitting, Cuff Size: Large)   Pulse 69   Temp 98.1 F (36.7 C) (Oral)   Ht 5' 3.78" (1.62 m)   Wt 196 lb 6.4 oz (89.1 kg)   BMI 33.94 kg/m     Body mass index is 33.94 kg/m. Wt Readings from Last 3 Encounters:  11/25/17 196 lb 6.4 oz (89.1 kg)  11/01/17 192 lb (87.1 kg)  07/10/17 188 lb (85.3 kg)    Physical Exam: Vital signs reviewed WPY:KDXI is a well-developed well-nourished alert cooperative    who appearsr stated age in no acute distress.  HEENT: normocephalic atraumatic , Eyes: PERRL EOM's full, conjunctiva clear, Nares: paten,t no deformity discharge or tenderness., Ears: no deformity EAC's clear TMs with normal landmarks. Mouth: clear OP, no lesions, edema.  Moist mucous membranes. Dentition in adequate repair. NECK: supple without masses, thyromegaly or bruits. CHEST/PULM:  Clear to auscultation and percussion breath sounds equal no wheeze , rales or rhonchi. No chest wall deformities or tenderness. Breast: normal by inspection . No dimpling, discharge, masses, tenderness or discharge . CV: PMI is nondisplaced, S1 S2 no gallops, murmurs, rubs. Peripheral pulses are full without delay.No JVD .  ABDOMEN: Bowel sounds normal nontender  No guard or rebound, no hepato splenomegal no CVA tenderness.  No hernia. Extremtities:  No clubbing cyanosis or edema, no acute joint swelling or redness no focal atrophy NEURO:  Oriented x3, cranial nerves 3-12 appear to be intact, no obvious focal weakness,gait within normal limits no abnormal reflexes or asymmetrical SKIN: No acute rashes normal turgor, color, no bruising or petechiae. Left  lower post leg  Palpated   Brown patchy  Area over about 10 cm  Non tender  PSYCH: Oriented, good eye contact, no obvious depression anxiety, cognition and judgment appear normal. LN: no cervical axillary inguinal adenopathy  Lab Results  Component Value Date   WBC 8.5 02/28/2017   HGB 14.5 02/28/2017   HCT 43.0 02/28/2017   PLT 240.0  02/28/2017   GLUCOSE 100 (H) 02/28/2017   CHOL 169 02/04/2017   TRIG 85 02/04/2017   HDL 49 02/04/2017   LDLDIRECT 200.7 02/04/2013   LDLCALC 103 02/04/2017   ALT 25 02/28/2017   AST 18 02/28/2017   NA 137 02/28/2017   K 3.5 02/28/2017   CL 101 02/28/2017   CREATININE 0.78 02/28/2017   BUN 12 02/28/2017   CO2 29 02/28/2017   TSH 0.82 02/28/2017   HGBA1C 5.3 08/29/2016    BP Readings from Last 3 Encounters:  11/25/17 116/68  11/01/17 120/79  07/10/17 122/82    ASSESSMENT AND PLAN:  Discussed the following assessment and plan:  Visit for preventive health examination - Plan: Basic metabolic panel, CBC with Differential/Platelet, Hemoglobin A1c, Hepatic function panel, Lipid panel, TSH  Family history of premature CAD - Plan: Basic metabolic panel, CBC with Differential/Platelet, Hemoglobin A1c, Hepatic function panel, Lipid panel, TSH  Hx of thrombosis of lower extremity in pregnancy - Plan: Basic metabolic panel, CBC with Differential/Platelet, Hemoglobin A1c, Hepatic function panel, Lipid panel, TSH  Hypercoagulable state ([place]) - Plan: Basic metabolic panel, CBC with Differential/Platelet, Hemoglobin A1c, Hepatic function panel, Lipid panel, TSH  Long term (current) use of anticoagulants  Hx gestational diabetes - Plan: Basic metabolic panel, CBC with Differential/Platelet, Hemoglobin A1c, Hepatic function panel, Lipid panel, TSH  Medication management - Plan: Basic metabolic panel, CBC with Differential/Platelet, Hemoglobin A1c, Hepatic function panel, Lipid panel, TSH  Hyperlipidemia, unspecified hyperlipidemia type - Plan: Basic metabolic panel, CBC with Differential/Platelet, Hemoglobin A1c, Hepatic function panel, Lipid panel, TSH  Need for influenza vaccination - Plan: Flu Vaccine QUAD 6+ mos PF IM (Fluarix Quad PF)  Patient Care Team: Burnis Medin, MD as PCP - General (Internal Medicine) Annia Belt, MD as Consulting Physician (Oncology) Elam Dutch, MD as Consulting Physician (Vascular Surgery) Bobbye Charleston, MD as Consulting Physician (Obstetrics and Gynecology) Bo Merino, MD  as Consulting Physician (Rheumatology) Patient Instructions  Continue attention to  lifestyle intervention healthy eating and exercise . Get appt for fasting lab tests and will let you know when back . If all ok then yearly  Check up and lab .   Health Maintenance, Female Adopting a healthy lifestyle and getting preventive care can go a long way to promote health and wellness. Talk with your health care provider about what schedule of regular examinations is right for you. This is a good chance for you to check in with your provider about disease prevention and staying healthy. In between checkups, there are plenty of things you can do on your own. Experts have done a lot of research about which lifestyle changes and preventive measures are most likely to keep you healthy. Ask your health care provider for more information. Weight and diet Eat a healthy diet  Be sure to include plenty of vegetables, fruits, low-fat dairy products, and lean protein.  Do not eat a lot of foods high in solid fats, added sugars, or salt.  Get regular exercise. This is one of the most important things you can do for your health. ? Most adults should exercise for at least 150 minutes each week. The exercise should increase your heart rate and make you sweat (moderate-intensity exercise). ? Most adults should also do strengthening exercises at least twice a week. This is in addition to the moderate-intensity exercise.  Maintain a healthy weight  Body mass index (BMI) is a measurement that can be used to identify possible weight problems. It estimates body fat based on height and weight. Your health care provider can help determine your BMI and help you achieve or maintain a healthy weight.  For females 68 years of age and older: ? A BMI below 18.5 is considered  underweight. ? A BMI of 18.5 to 24.9 is normal. ? A BMI of 25 to 29.9 is considered overweight. ? A BMI of 30 and above is considered obese.  Watch levels of cholesterol and blood lipids  You should start having your blood tested for lipids and cholesterol at 43 years of age, then have this test every 5 years.  You may need to have your cholesterol levels checked more often if: ? Your lipid or cholesterol levels are high. ? You are older than 43 years of age. ? You are at high risk for heart disease.  Cancer screening Lung Cancer  Lung cancer screening is recommended for adults 77-62 years old who are at high risk for lung cancer because of a history of smoking.  A yearly low-dose CT scan of the lungs is recommended for people who: ? Currently smoke. ? Have quit within the past 15 years. ? Have at least a 30-pack-year history of smoking. A pack year is smoking an average of one pack of cigarettes a day for 1 year.  Yearly screening should continue until it has been 15 years since you quit.  Yearly screening should stop if you develop a health problem that would prevent you from having lung cancer treatment.  Breast Cancer  Practice breast self-awareness. This means understanding how your breasts normally appear and feel.  It also means doing regular breast self-exams. Let your health care provider know about any changes, no matter how small.  If you are in your 20s or 30s, you should have a clinical breast exam (CBE) by a health care provider every 1-3 years as part of a regular health exam.  If  you are 40 or older, have a CBE every year. Also consider having a breast X-ray (mammogram) every year.  If you have a family history of breast cancer, talk to your health care provider about genetic screening.  If you are at high risk for breast cancer, talk to your health care provider about having an MRI and a mammogram every year.  Breast cancer gene (BRCA) assessment is  recommended for women who have family members with BRCA-related cancers. BRCA-related cancers include: ? Breast. ? Ovarian. ? Tubal. ? Peritoneal cancers.  Results of the assessment will determine the need for genetic counseling and BRCA1 and BRCA2 testing.  Cervical Cancer Your health care provider may recommend that you be screened regularly for cancer of the pelvic organs (ovaries, uterus, and vagina). This screening involves a pelvic examination, including checking for microscopic changes to the surface of your cervix (Pap test). You may be encouraged to have this screening done every 3 years, beginning at age 38.  For women ages 89-65, health care providers may recommend pelvic exams and Pap testing every 3 years, or they may recommend the Pap and pelvic exam, combined with testing for human papilloma virus (HPV), every 5 years. Some types of HPV increase your risk of cervical cancer. Testing for HPV may also be done on women of any age with unclear Pap test results.  Other health care providers may not recommend any screening for nonpregnant women who are considered low risk for pelvic cancer and who do not have symptoms. Ask your health care provider if a screening pelvic exam is right for you.  If you have had past treatment for cervical cancer or a condition that could lead to cancer, you need Pap tests and screening for cancer for at least 20 years after your treatment. If Pap tests have been discontinued, your risk factors (such as having a new sexual partner) need to be reassessed to determine if screening should resume. Some women have medical problems that increase the chance of getting cervical cancer. In these cases, your health care provider may recommend more frequent screening and Pap tests.  Colorectal Cancer  This type of cancer can be detected and often prevented.  Routine colorectal cancer screening usually begins at 43 years of age and continues through 43 years of  age.  Your health care provider may recommend screening at an earlier age if you have risk factors for colon cancer.  Your health care provider may also recommend using home test kits to check for hidden blood in the stool.  A small camera at the end of a tube can be used to examine your colon directly (sigmoidoscopy or colonoscopy). This is done to check for the earliest forms of colorectal cancer.  Routine screening usually begins at age 20.  Direct examination of the colon should be repeated every 5-10 years through 43 years of age. However, you may need to be screened more often if early forms of precancerous polyps or small growths are found.  Skin Cancer  Check your skin from head to toe regularly.  Tell your health care provider about any new moles or changes in moles, especially if there is a change in a mole's shape or color.  Also tell your health care provider if you have a mole that is larger than the size of a pencil eraser.  Always use sunscreen. Apply sunscreen liberally and repeatedly throughout the day.  Protect yourself by wearing long sleeves, pants, a wide-brimmed hat, and sunglasses  whenever you are outside.  Heart disease, diabetes, and high blood pressure  High blood pressure causes heart disease and increases the risk of stroke. High blood pressure is more likely to develop in: ? People who have blood pressure in the high end of the normal range (130-139/85-89 mm Hg). ? People who are overweight or obese. ? People who are African American.  If you are 54-43 years of age, have your blood pressure checked every 3-5 years. If you are 97 years of age or older, have your blood pressure checked every year. You should have your blood pressure measured twice-once when you are at a hospital or clinic, and once when you are not at a hospital or clinic. Record the average of the two measurements. To check your blood pressure when you are not at a hospital or clinic, you  can use: ? An automated blood pressure machine at a pharmacy. ? A home blood pressure monitor.  If you are between 48 years and 70 years old, ask your health care provider if you should take aspirin to prevent strokes.  Have regular diabetes screenings. This involves taking a blood sample to check your fasting blood sugar level. ? If you are at a normal weight and have a low risk for diabetes, have this test once every three years after 43 years of age. ? If you are overweight and have a high risk for diabetes, consider being tested at a younger age or more often. Preventing infection Hepatitis B  If you have a higher risk for hepatitis B, you should be screened for this virus. You are considered at high risk for hepatitis B if: ? You were born in a country where hepatitis B is common. Ask your health care provider which countries are considered high risk. ? Your parents were born in a high-risk country, and you have not been immunized against hepatitis B (hepatitis B vaccine). ? You have HIV or AIDS. ? You use needles to inject street drugs. ? You live with someone who has hepatitis B. ? You have had sex with someone who has hepatitis B. ? You get hemodialysis treatment. ? You take certain medicines for conditions, including cancer, organ transplantation, and autoimmune conditions.  Hepatitis C  Blood testing is recommended for: ? Everyone born from [birth year] through 1966. ? Anyone with known risk factors for hepatitis C.  Sexually transmitted infections (STIs)  You should be screened for sexually transmitted infections (STIs) including gonorrhea and chlamydia if: ? You are sexually active and are younger than 43 years of age. ? You are older than 43 years of age and your health care provider tells you that you are at risk for this type of infection. ? Your sexual activity has changed since you were last screened and you are at an increased risk for chlamydia or gonorrhea. Ask your  health care provider if you are at risk.  If you do not have HIV, but are at risk, it may be recommended that you take a prescription medicine daily to prevent HIV infection. This is called pre-exposure prophylaxis (PrEP). You are considered at risk if: ? You are sexually active and do not regularly use condoms or know the HIV status of your partner(s). ? You take drugs by injection. ? You are sexually active with a partner who has HIV.  Talk with your health care provider about whether you are at high risk of being infected with HIV. If you choose to begin PrEP, you should  first be tested for HIV. You should then be tested every 3 months for as long as you are taking PrEP. Pregnancy  If you are premenopausal and you may become pregnant, ask your health care provider about preconception counseling.  If you may become pregnant, take 400 to 800 micrograms (mcg) of folic acid every day.  If you want to prevent pregnancy, talk to your health care provider about birth control (contraception). Osteoporosis and menopause  Osteoporosis is a disease in which the bones lose minerals and strength with aging. This can result in serious bone fractures. Your risk for osteoporosis can be identified using a bone density scan.  If you are 70 years of age or older, or if you are at risk for osteoporosis and fractures, ask your health care provider if you should be screened.  Ask your health care provider whether you should take a calcium or vitamin D supplement to lower your risk for osteoporosis.  Menopause may have certain physical symptoms and risks.  Hormone replacement therapy may reduce some of these symptoms and risks. Talk to your health care provider about whether hormone replacement therapy is right for you. Follow these instructions at home:  Schedule regular health, dental, and eye exams.  Stay current with your immunizations.  Do not use any tobacco products including cigarettes, chewing  tobacco, or electronic cigarettes.  If you are pregnant, do not drink alcohol.  If you are breastfeeding, limit how much and how often you drink alcohol.  Limit alcohol intake to no more than 1 drink per day for nonpregnant women. One drink equals 12 ounces of beer, 5 ounces of wine, or 1 ounces of hard liquor.  Do not use street drugs.  Do not share needles.  Ask your health care provider for help if you need support or information about quitting drugs.  Tell your health care provider if you often feel depressed.  Tell your health care provider if you have ever been abused or do not feel safe at home. This information is not intended to replace advice given to you by your health care provider. Make sure you discuss any questions you have with your health care provider. Document Released: 10/02/2010 Document Revised: 08/25/2015 Document Reviewed: 12/21/2014 Elsevier Interactive Patient Education  2018 [place]. Kasondra Junod M.D.

## 2017-11-25 ENCOUNTER — Ambulatory Visit (INDEPENDENT_AMBULATORY_CARE_PROVIDER_SITE_OTHER): Payer: 59 | Admitting: Internal Medicine

## 2017-11-25 ENCOUNTER — Encounter: Payer: Self-pay | Admitting: Internal Medicine

## 2017-11-25 VITALS — BP 116/68 | HR 69 | Temp 98.1°F | Ht 63.78 in | Wt 196.4 lb

## 2017-11-25 DIAGNOSIS — Z7901 Long term (current) use of anticoagulants: Secondary | ICD-10-CM

## 2017-11-25 DIAGNOSIS — Z79899 Other long term (current) drug therapy: Secondary | ICD-10-CM

## 2017-11-25 DIAGNOSIS — Z8249 Family history of ischemic heart disease and other diseases of the circulatory system: Secondary | ICD-10-CM

## 2017-11-25 DIAGNOSIS — Z23 Encounter for immunization: Secondary | ICD-10-CM | POA: Diagnosis not present

## 2017-11-25 DIAGNOSIS — Z8632 Personal history of gestational diabetes: Secondary | ICD-10-CM

## 2017-11-25 DIAGNOSIS — Z86718 Personal history of other venous thrombosis and embolism: Secondary | ICD-10-CM

## 2017-11-25 DIAGNOSIS — E785 Hyperlipidemia, unspecified: Secondary | ICD-10-CM

## 2017-11-25 DIAGNOSIS — Z0000 Encounter for general adult medical examination without abnormal findings: Secondary | ICD-10-CM | POA: Diagnosis not present

## 2017-11-25 DIAGNOSIS — D6859 Other primary thrombophilia: Secondary | ICD-10-CM

## 2017-11-26 ENCOUNTER — Ambulatory Visit: Payer: 59 | Admitting: Rheumatology

## 2017-11-27 ENCOUNTER — Ambulatory Visit: Payer: 59 | Admitting: Neurology

## 2017-11-27 DIAGNOSIS — M6281 Muscle weakness (generalized): Secondary | ICD-10-CM | POA: Diagnosis not present

## 2017-11-27 DIAGNOSIS — R262 Difficulty in walking, not elsewhere classified: Secondary | ICD-10-CM | POA: Diagnosis not present

## 2017-11-27 DIAGNOSIS — M79672 Pain in left foot: Secondary | ICD-10-CM | POA: Diagnosis not present

## 2017-12-03 DIAGNOSIS — M79672 Pain in left foot: Secondary | ICD-10-CM | POA: Diagnosis not present

## 2017-12-03 DIAGNOSIS — R262 Difficulty in walking, not elsewhere classified: Secondary | ICD-10-CM | POA: Diagnosis not present

## 2017-12-03 DIAGNOSIS — M6281 Muscle weakness (generalized): Secondary | ICD-10-CM | POA: Diagnosis not present

## 2017-12-10 DIAGNOSIS — M6281 Muscle weakness (generalized): Secondary | ICD-10-CM | POA: Diagnosis not present

## 2017-12-10 DIAGNOSIS — R262 Difficulty in walking, not elsewhere classified: Secondary | ICD-10-CM | POA: Diagnosis not present

## 2017-12-10 DIAGNOSIS — M79672 Pain in left foot: Secondary | ICD-10-CM | POA: Diagnosis not present

## 2017-12-12 DIAGNOSIS — R262 Difficulty in walking, not elsewhere classified: Secondary | ICD-10-CM | POA: Diagnosis not present

## 2017-12-12 DIAGNOSIS — M6281 Muscle weakness (generalized): Secondary | ICD-10-CM | POA: Diagnosis not present

## 2017-12-12 DIAGNOSIS — M79672 Pain in left foot: Secondary | ICD-10-CM | POA: Diagnosis not present

## 2017-12-26 DIAGNOSIS — Z1231 Encounter for screening mammogram for malignant neoplasm of breast: Secondary | ICD-10-CM | POA: Diagnosis not present

## 2017-12-26 DIAGNOSIS — Z124 Encounter for screening for malignant neoplasm of cervix: Secondary | ICD-10-CM | POA: Diagnosis not present

## 2017-12-26 DIAGNOSIS — Z01419 Encounter for gynecological examination (general) (routine) without abnormal findings: Secondary | ICD-10-CM | POA: Diagnosis not present

## 2018-02-24 DIAGNOSIS — E7800 Pure hypercholesterolemia, unspecified: Secondary | ICD-10-CM | POA: Diagnosis not present

## 2018-02-24 DIAGNOSIS — D6851 Activated protein C resistance: Secondary | ICD-10-CM | POA: Diagnosis not present

## 2018-02-24 DIAGNOSIS — M792 Neuralgia and neuritis, unspecified: Secondary | ICD-10-CM | POA: Diagnosis not present

## 2018-02-24 DIAGNOSIS — Z0189 Encounter for other specified special examinations: Secondary | ICD-10-CM | POA: Diagnosis not present

## 2018-02-24 DIAGNOSIS — Z136 Encounter for screening for cardiovascular disorders: Secondary | ICD-10-CM | POA: Diagnosis not present

## 2018-03-04 ENCOUNTER — Other Ambulatory Visit (INDEPENDENT_AMBULATORY_CARE_PROVIDER_SITE_OTHER): Payer: 59

## 2018-03-04 DIAGNOSIS — Z86718 Personal history of other venous thrombosis and embolism: Secondary | ICD-10-CM

## 2018-03-04 DIAGNOSIS — Z0000 Encounter for general adult medical examination without abnormal findings: Secondary | ICD-10-CM | POA: Diagnosis not present

## 2018-03-04 DIAGNOSIS — E785 Hyperlipidemia, unspecified: Secondary | ICD-10-CM

## 2018-03-04 DIAGNOSIS — Z8249 Family history of ischemic heart disease and other diseases of the circulatory system: Secondary | ICD-10-CM | POA: Diagnosis not present

## 2018-03-04 DIAGNOSIS — D6859 Other primary thrombophilia: Secondary | ICD-10-CM | POA: Diagnosis not present

## 2018-03-04 DIAGNOSIS — Z8632 Personal history of gestational diabetes: Secondary | ICD-10-CM

## 2018-03-04 DIAGNOSIS — Z79899 Other long term (current) drug therapy: Secondary | ICD-10-CM

## 2018-03-04 LAB — HEPATIC FUNCTION PANEL
ALT: 19 U/L (ref 0–35)
AST: 16 U/L (ref 0–37)
Albumin: 4.3 g/dL (ref 3.5–5.2)
Alkaline Phosphatase: 64 U/L (ref 39–117)
BILIRUBIN DIRECT: 0.2 mg/dL (ref 0.0–0.3)
TOTAL PROTEIN: 7.5 g/dL (ref 6.0–8.3)
Total Bilirubin: 1.2 mg/dL (ref 0.2–1.2)

## 2018-03-04 LAB — LIPID PANEL
CHOL/HDL RATIO: 4
Cholesterol: 238 mg/dL — ABNORMAL HIGH (ref 0–200)
HDL: 53.7 mg/dL (ref >39.00)
LDL Cholesterol: 162 mg/dL — ABNORMAL HIGH (ref 0–99)
NONHDL: 184.61
TRIGLYCERIDES: 115 mg/dL (ref 0.0–149.0)
VLDL: 23 mg/dL (ref 0.0–40.0)

## 2018-03-04 LAB — BASIC METABOLIC PANEL
BUN: 11 mg/dL (ref 6–23)
CO2: 28 mEq/L (ref 19–32)
Calcium: 9.5 mg/dL (ref 8.4–10.5)
Chloride: 101 mEq/L (ref 96–112)
Creatinine, Ser: 0.73 mg/dL (ref 0.40–1.20)
GFR: 92.22 mL/min (ref >60.00)
GLUCOSE: 90 mg/dL (ref 70–99)
Potassium: 4.2 mEq/L (ref 3.5–5.1)
Sodium: 138 mEq/L (ref 135–145)

## 2018-03-04 LAB — CBC WITH DIFFERENTIAL/PLATELET
BASOS PCT: 0.6 % (ref 0.0–3.0)
Basophils Absolute: 0 10*3/uL (ref 0.0–0.1)
EOS ABS: 0.1 10*3/uL (ref 0.0–0.7)
Eosinophils Relative: 1.6 % (ref 0.0–5.0)
HEMATOCRIT: 41.9 % (ref 36.0–46.0)
HEMOGLOBIN: 14.4 g/dL (ref 12.0–15.0)
LYMPHS PCT: 35.3 % (ref 12.0–46.0)
Lymphs Abs: 2.1 10*3/uL (ref 0.7–4.0)
MCHC: 34.4 g/dL (ref 30.0–36.0)
MCV: 94 fl (ref 78.0–100.0)
Monocytes Absolute: 0.3 10*3/uL (ref 0.1–1.0)
Monocytes Relative: 5.1 % (ref 3.0–12.0)
Neutro Abs: 3.4 10*3/uL (ref 1.4–7.7)
Neutrophils Relative %: 57.4 % (ref 43.0–77.0)
Platelets: 212 10*3/uL (ref 150.0–400.0)
RBC: 4.46 Mil/uL (ref 3.87–5.11)
RDW: 11.6 % (ref 11.5–15.5)
WBC: 6 10*3/uL (ref 4.0–10.5)

## 2018-03-04 LAB — HEMOGLOBIN A1C: Hgb A1c MFr Bld: 5.6 % (ref 4.6–6.5)

## 2018-03-04 LAB — TSH: TSH: 0.93 u[IU]/mL (ref 0.35–4.50)

## 2018-03-19 ENCOUNTER — Other Ambulatory Visit: Payer: Self-pay | Admitting: Internal Medicine

## 2018-03-19 MED ORDER — ENOXAPARIN SODIUM 40 MG/0.4ML ~~LOC~~ SOLN: 40.0000 mg | SUBCUTANEOUS | 0 refills | Status: DC

## 2018-03-19 NOTE — Telephone Encounter (Signed)
Copied from CRM [id number]. Topic: Quick Communication - Rx Refill/Question >> Mar 19, 2018 12:01 PM Angela NevinWilliams, Candice N wrote: Medication: enoxaparin (LOVENOX) 40 MG/0.4ML injection  Patient is requesting a refill of this medication. Please advise.   Preferred Pharmacy (with phone number or street name):[id number], [place] - [address] [phone] (Phone) [phone] (Fax)

## 2018-03-19 NOTE — Telephone Encounter (Signed)
Requested Prescriptions  Pending Prescriptions Disp Refills  . enoxaparin (LOVENOX) 40 MG/0.4ML injection 20 Syringe 0    Sig: Inject 0.4 mLs (40 mg total) into the skin daily.     Hematology:  Anticoagulants Passed - 03/19/2018 12:07 PM      Passed - HGB in normal range and within 360 days    Hemoglobin  Date Value Ref Range Status  03/04/2018 14.4 12.0 - 15.0 g/dL Final   HGB  Date Value Ref Range Status  11/05/2012 13.2 11.6 - 15.9 g/dL Final         Passed - PLT in normal range and within 360 days    Platelets  Date Value Ref Range Status  03/04/2018 212.0 150.0 - 400.0 K/uL Final  11/05/2012 221 145 - 400 10e3/uL Final         Passed - HCT in normal range and within 360 days    HCT  Date Value Ref Range Status  03/04/2018 41.9 36.0 - 46.0 % Final  11/05/2012 40.2 34.8 - 46.6 % Final         Passed - Cr in normal range and within 360 days    Creatinine  Date Value Ref Range Status  11/05/2012 0.8 0.6 - 1.1 mg/dL Final   Creatinine, Ser  Date Value Ref Range Status  03/04/2018 0.73 0.40 - 1.20 mg/dL Final         Passed - Valid encounter within last 12 months    Recent Outpatient Visits          3 months ago [facility] health [facility] Neta MendsWanda K, MD   7 months ago Fibromatosis, plantar   [facility] Smith, Zachary M, DO   8 months ago Pain of left heel   [facility] Primas[facility], Zachary M, DO   9 months ago Acute non-recurrent maxillary sinusitis   [facility] StanleyKuneff, Renee A, DO   1 year ago Primary osteoarthritis of both knees   [facility] Judi SaaSmith, Zachary M, DO      Future Appointments            In 1 month Deveshwar, Janalyn RouseShaili, MD [facility]

## 2018-04-07 ENCOUNTER — Encounter: Payer: Self-pay | Admitting: Family Medicine

## 2018-04-07 ENCOUNTER — Ambulatory Visit (INDEPENDENT_AMBULATORY_CARE_PROVIDER_SITE_OTHER): Payer: 59 | Admitting: Family Medicine

## 2018-04-07 VITALS — BP 124/83 | HR 91 | Temp 98.5°F | Resp 12 | Ht 63.78 in | Wt 198.1 lb

## 2018-04-07 DIAGNOSIS — J988 Other specified respiratory disorders: Secondary | ICD-10-CM

## 2018-04-07 DIAGNOSIS — R059 Cough, unspecified: Secondary | ICD-10-CM

## 2018-04-07 DIAGNOSIS — J45909 Unspecified asthma, uncomplicated: Secondary | ICD-10-CM

## 2018-04-07 DIAGNOSIS — R05 Cough: Secondary | ICD-10-CM | POA: Diagnosis not present

## 2018-04-07 MED ORDER — ALBUTEROL SULFATE HFA 108 (90 BASE) MCG/ACT IN AERS: 2.0000 | INHALATION_SPRAY | Freq: Four times a day (QID) | RESPIRATORY_TRACT | 0 refills | Status: DC | PRN

## 2018-04-07 MED ORDER — AZITHROMYCIN 250 MG PO TABS: ORAL_TABLET | ORAL | 0 refills | Status: AC

## 2018-04-07 MED ORDER — BENZONATATE 100 MG PO CAPS: 200.0000 mg | ORAL_CAPSULE | Freq: Two times a day (BID) | ORAL | 0 refills | Status: AC | PRN

## 2018-04-07 NOTE — Progress Notes (Signed)
ACUTE VISIT  HPI:  Chief Complaint  Patient presents with  . Cough    sx started 1 week ago  . Chest congestion  . Nasal Congestion    [name] is a 43 y.o.female here today complaining of 7 days of respiratory symptoms. She states that the symptoms started like "a little cold." Productive cough with yellowish/greenish sputum, she denies hemoptysis. Reporting associated shortness of breath and wheezing, she has history of asthma.  She has not had an asthma exacerbation for years, she does not have a albuterol inhaler at home.  She has not identified exacerbating or alleviating factors. Dyspnea, wheezing, and cough are not related with exertion, also happen at rest.  She denies associated chest pain, palpitations, or diaphoresis. She denies fever, chills, or body aches. Sore throat, exacerbated by swallowing. She denies dysphasia or stridor. Mild nasal congestion and postnasal drainage.  She thinks she needs antibiotic treatment.  Cough  This is a new problem. The current episode started in the past 7 days. The problem has been gradually worsening. The problem occurs constantly. The cough is productive of sputum. Associated symptoms include headaches, nasal congestion, postnasal drip, rhinorrhea, a sore throat, shortness of breath and wheezing. Pertinent negatives include no chest pain, chills, ear congestion, ear pain, eye redness, fever, heartburn, hemoptysis, myalgias or rash. The symptoms are aggravated by lying down. She has tried OTC cough suppressant for the symptoms. The treatment provided no relief. Her past medical history is significant for asthma and environmental allergies.    Mild frontal pressure headache, worse in the morning.  No Hx of recent travel. Her husband and children were sick with similar symptoms. No known insect bite.  Hx of allergies: Yes.  Currently she is not on OTC antihistaminic.   OTC medications for this problem:"Cold and  cough" medication, which has not helped at all.  Review of Systems  Constitutional: Positive for fatigue. Negative for activity change, appetite change, chills and fever.  HENT: Positive for congestion, postnasal drip, rhinorrhea, sinus pressure and sore throat. Negative for ear pain, mouth sores, trouble swallowing and voice change.   Eyes: Negative for discharge and redness.  Respiratory: Positive for cough, shortness of breath and wheezing. Negative for hemoptysis.   Cardiovascular: Negative for chest pain.  Gastrointestinal: Negative for abdominal pain, diarrhea, heartburn, nausea and vomiting.  Musculoskeletal: Negative for myalgias and neck pain.  Skin: Negative for rash.  Allergic/Immunologic: Positive for environmental allergies.  Neurological: Positive for headaches. Negative for syncope and weakness.  Hematological: Negative for adenopathy. Does not bruise/bleed easily.  Psychiatric/Behavioral: Positive for sleep disturbance. The patient is nervous/anxious.       Current Outpatient Medications on File Prior to Visit  Medication Sig Dispense Refill  . aspirin 81 MG tablet Take 81 mg by mouth daily.    Marland Kitchen. enoxaparin (LOVENOX) 40 MG/0.4ML injection Inject 0.4 mLs (40 mg total) into the skin daily. 20 Syringe 0  . Enoxaparin Sodium (LOVENOX IJ) Inject as directed as needed (only when traveling).     . Multiple Vitamin (MULTI-VITAMIN PO) Take by mouth.    . rosuvastatin (CRESTOR) 20 MG tablet rosuvastatin 20 mg tablet    . tizanidine (ZANAFLEX) 2 MG capsule Take 1 capsule (2 mg total) by mouth 2 (two) times daily as needed for muscle spasms. 60 capsule 3   No current facility-administered medications on file prior to visit.      Past Medical History:  Diagnosis Date  . Arthritis   .  DVT (deep venous thrombosis) (HCC)   . Gestational diabetes   . High cholesterol   . Hx of thrombosis 2014   Left lower extrm.  Marland Kitchen Positive TB test   . Post-phlebitic syndrome 2014   Left  LE    . Thrombosis    Left leg preg tulip filter then removed.   . Tuberculosis    Positive test   No Known Allergies  Social History   Socioeconomic History  . Marital status: Married    Spouse name: Autumn Cruz  . Number of children: 2  . Years of education: Not on file  . Highest education level: Not on file  Occupational History  . Occupation: Futures trader  Social Needs  . Financial resource strain: Not on file  . Food insecurity:    Worry: Not on file    Inability: Not on file  . Transportation needs:    Medical: Not on file    Non-medical: Not on file  Tobacco Use  . Smoking status: Never Smoker  . Smokeless tobacco: Never Used  Substance and Sexual Activity  . Alcohol use: No    Alcohol/week: 0.0 standard drinks  . Drug use: No  . Sexual activity: Not on file  Lifestyle  . Physical activity:    Days per week: Not on file    Minutes per session: Not on file  . Stress: Not on file  Relationships  . Social connections:    Talks on phone: Not on file    Gets together: Not on file    Attends religious service: Not on file    Active member of club or organization: Not on file    Attends meetings of clubs or organizations: Not on file    Relationship status: Not on file  Other Topics Concern  . Not on file  Social History Narrative   Usually gets 6-7 hours of sleep per night   4 people living in the home.   Originally from [place] has a bachelor's degree some training in nursing and alternative medicine.   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   Negative tobacco alcohol some caffeine   Gravida 2 para 2 last Pap 2013   Think she had T. 2012    Vitals:   04/07/18 1035  BP: 124/83  Pulse: 91  Resp: 12  Temp: 98.5 F (36.9 C)  SpO2: 98%   Body mass index is 34.24 kg/m.   Physical Exam  Nursing note and vitals reviewed. Constitutional: She is  oriented to person, place, and time. She appears well-developed. She does not appear ill. No distress.  HENT:  Head: Normocephalic and atraumatic.  Right Ear: Tympanic membrane, external ear and ear canal normal.  Left Ear: Tympanic membrane, external ear and ear canal normal.  Nose: Rhinorrhea present. Right sinus exhibits no maxillary sinus tenderness and no frontal sinus tenderness. Left sinus exhibits no maxillary sinus tenderness and no frontal sinus tenderness.  Mouth/Throat: Oropharynx is clear and moist and mucous membranes are normal.  Hypertrophic turbinates. Postnasal drainage.  Eyes: Conjunctivae are normal.  Neck: No muscular tenderness present. No edema and no erythema present.  Cardiovascular: Normal rate and regular rhythm.  No murmur heard. Respiratory: Effort normal and breath sounds normal. No stridor. No respiratory distress.  Lymphadenopathy:       Head (right side): No submandibular adenopathy present.  Head (left side): No submandibular adenopathy present.    She has no cervical adenopathy.  Neurological: She is alert and oriented to person, place, and time. She has normal strength.  Skin: Skin is warm. No rash noted. No erythema.  Psychiatric: Her mood appears anxious.  Well groomed, good eye contact.    ASSESSMENT AND PLAN:  Ms. Autumn Cruz was seen today for cough, chest congestion and nasal congestion.  Diagnoses and all orders for this visit:  Mild reactive airways disease, unspecified whether persistent Auscultation today negative for wheezing, rales, or rhonchi. I do not think oral steroid is needed today. Albuterol inh 2 puff every 6 hours for a week then as needed for wheezing or shortness of breath.   -     albuterol (PROVENTIL HFA;VENTOLIN HFA) 108 (90 Base) MCG/ACT inhaler; Inhale 2 puffs into the lungs every 6 (six) hours as needed for wheezing or shortness of breath.  Respiratory tract infection Explained that it is most likely a viral  illness, in which case antibiotic will not help. She really would like antibiotic treatment, some side effect discussed. Follow-up with PCP in 1 to 2 weeks if she is not greatly improved.  -     azithromycin (ZITHROMAX) 250 MG tablet; 2 tabs day one then 1 tab daily for 4 days.  Cough Explained that cough and congestion can last a few days and even weeks after URI. I do not think imaging is needed today. Instructed about warning signs.  -     benzonatate (TESSALON) 100 MG capsule; Take 2 capsules (200 mg total) by mouth 2 (two) times daily as needed for up to 10 days.    -Autumn Cruz advised to seek attention immediately if symptoms worsen or to follow if they persist or new concerns arise.     Autumn G. SwazilandJordan, MD  [facility] [place] [facility].

## 2018-04-07 NOTE — Patient Instructions (Signed)
  [name] I have seen you today for an acute visit.  A few things to remember from today's visit:   Respiratory tract infection - Plan: azithromycin (ZITHROMAX) 250 MG tablet  Mild reactive airways disease, unspecified whether persistent - Plan: albuterol (PROVENTIL HFA;VENTOLIN HFA) 108 (90 Base) MCG/ACT inhaler  Cough - Plan: benzonatate (TESSALON) 100 MG capsule   If medications prescribed today, they will not be refill upon request, a follow up appointment with PCP will be necessary to discuss continuation of of treatment if appropriate.  viral infections are self-limited and we treat each symptom depending of severity.  Over the counter medications as decongestants and cold medications usually help, they need to be taken with caution if there is a history of high blood pressure or palpitations. Tylenol and/or Ibuprofen also helps with most symptoms (headache, muscle aching, fever,etc) Plenty of fluids. Honey helps with cough. Steam inhalations helps with runny nose, nasal congestion, and may prevent sinus infections. Cough and nasal congestion could last a few days and sometimes weeks.    In general please monitor for signs of worsening symptoms and seek immediate medical attention if any concerning.  If symptoms are not resolved in 1-2 weeks you should schedule a follow up appointment with your doctor, before if needed.  I hope you get better soon!

## 2018-04-14 ENCOUNTER — Ambulatory Visit: Payer: Self-pay

## 2018-04-14 NOTE — Telephone Encounter (Signed)
Returned call to patient who states she is non stop coughing.  She states it is like a deep itch. She feels that her chest is tight. Her cough is dry.  She has a wheeze. She has finished her antibiotic and has taken several OTC cough medications. No fever. Appointment scheduled per protocol. Care advice read to patient. Pt verbalized understanding of all instructions.  Reason for Disposition . [1] Continuous (nonstop) coughing interferes with work or school AND [2] no improvement using cough treatment per protocol  Answer Assessment - Initial Assessment Questions 1. ONSET: "When did the cough begin?"      Last monday 2. SEVERITY: "How bad is the cough today?"      irritating 3. RESPIRATORY DISTRESS: "Describe your breathing."      SOB when lying down 4. FEVER: "Do you have a fever?" If so, ask: "What is your temperature, how was it measured, and when did it start?"     no 5. HEMOPTYSIS: "Are you coughing up any blood?" If so ask: "How much?" (flecks, streaks, tablespoons, etc.)     no 6. TREATMENT: "What have you done so far to treat the cough?" (e.g., meds, fluids, humidifier)     Cough medications and antibiotic 7. CARDIAC HISTORY: "Do you have any history of heart disease?" (e.g., heart attack, congestive heart failure)      no 8. LUNG HISTORY: "Do you have any history of lung disease?"  (e.g., pulmonary embolus, asthma, emphysema)     athma possible 9. PE RISK FACTORS: "Do you have a history of blood clots?" (or: recent major surgery, recent prolonged travel, bedridden)     no 10. OTHER SYMPTOMS: "Do you have any other symptoms? (e.g., runny nose, wheezing, chest pain)       wheezy 11. PREGNANCY: "Is there any chance you are pregnant?" "When was your last menstrual period?"       No 9 days ago 12. TRAVEL: "Have you traveled out of the country in the last month?" (e.g., travel history, exposures)       no  Protocols used: COUGH - ACUTE NON-PRODUCTIVE-A-AH

## 2018-04-14 NOTE — Progress Notes (Signed)
Chief Complaint  Patient presents with  . Cough    x 2 or more weeks. Nonproductive cough that pt describes as dry and annoying     HPI: Autumn Cruz 43 y.o. come in for on going cough   Saw dr [place] Jan 6 for mild rad  Given z pack and tessalon perles and  Ventolin   Feels beter all over but cough makes her tired  perles  No help albuterol may help for an hour at most  No sig  ur sinsus congestion and PND   Hard to sleep cause of coughing and throat is itching.  Onset like a cold .    Lay down  Sleeping  .  No fever.  Throat feels itchy at times  Remote hx of  Similar cough  In past  Responded to steroids?    ROS: See pertinent positives and negatives per HPI. No fever real sob chillds hemoptysis   Past Medical History:  Diagnosis Date  . Arthritis   . DVT (deep venous thrombosis) (HCC)   . Gestational diabetes   . High cholesterol   . Hx of thrombosis 2014   Left lower extrm.  Marland Kitchen Positive TB test   . Post-phlebitic syndrome 2014   Left  LE  . Thrombosis    Left leg preg tulip filter then removed.   . Tuberculosis    Positive test    Family History  Problem Relation Age of Onset  . Heart disease Mother        Blockage/Main artery  . Diabetes Mother   . Coronary artery disease Mother   . Hyperlipidemia Mother   . Hypertension Father   . Deep vein thrombosis Father   . Renal Disease Sister   . High Cholesterol Sister   . High Cholesterol Sister   . High Cholesterol Sister     Social History   Socioeconomic History  . Marital status: Married    Spouse name: [name]  . Number of children: 2  . Years of education: Not on file  . Highest education level: Not on file  Occupational History  . Occupation: Futures trader  Social Needs  . Financial resource strain: Not on file  . Food insecurity:    Worry: Not on file    Inability: Not on file  . Transportation needs:    Medical: Not on file    Non-medical: Not on file  Tobacco Use  . Smoking status: Never  Smoker  . Smokeless tobacco: Never Used  Substance and Sexual Activity  . Alcohol use: No    Alcohol/week: 0.0 standard drinks  . Drug use: No  . Sexual activity: Not on file  Lifestyle  . Physical activity:    Days per week: Not on file    Minutes per session: Not on file  . Stress: Not on file  Relationships  . Social connections:    Talks on phone: Not on file    Gets together: Not on file    Attends religious service: Not on file    Active member of club or organization: Not on file    Attends meetings of clubs or organizations: Not on file    Relationship status: Not on file  Other Topics Concern  . Not on file  Social History Narrative   Usually gets 6-7 hours of sleep per night   4 people living in the home.   Originally from [place] has a bachelor's degree some training in nursing and  alternative medicine.   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   Negative tobacco alcohol some caffeine   Gravida 2 para 2 last Pap 2013   Think she had T. 2012    Outpatient Medications Prior to Visit  Medication Sig Dispense Refill  . albuterol (PROVENTIL HFA;VENTOLIN HFA) 108 (90 Base) MCG/ACT inhaler Inhale 2 puffs into the lungs every 6 (six) hours as needed for wheezing or shortness of breath. 1 Inhaler 0  . aspirin 81 MG tablet Take 81 mg by mouth daily.    . benzonatate (TESSALON) 100 MG capsule Take 2 capsules (200 mg total) by mouth 2 (two) times daily as needed for up to 10 days. 40 capsule 0  . enoxaparin (LOVENOX) 40 MG/0.4ML injection Inject 0.4 mLs (40 mg total) into the skin daily. (Patient taking differently: Inject 40 mg into the skin as needed. ) 20 Syringe 0  . Multiple Vitamin (MULTI-VITAMIN PO) Take by mouth.    . rosuvastatin (CRESTOR) 20 MG tablet rosuvastatin 20 mg tablet    . tizanidine (ZANAFLEX) 2 MG capsule Take 1 capsule (2 mg total) by mouth 2 (two)  times daily as needed for muscle spasms. 60 capsule 3  . Enoxaparin Sodium (LOVENOX IJ) Inject as directed as needed (only when traveling).      No facility-administered medications prior to visit.      EXAM:  BP 124/72 (BP Location: Right Arm, Patient Position: Sitting, Cuff Size: Large)   Pulse 65   Temp 97.9 F (36.6 C) (Oral)   Wt 201 lb (91.2 kg)   LMP 04/05/2018   SpO2 99%   BMI 34.74 kg/m   Body mass index is 34.74 kg/m.  GENERAL: vitals reviewed and listed above, alert, oriented, appears well hydrated and in no acute distress intermittent dry cough   HEENT: atraumatic, conjunctiva  clear, no obvious abnormalities on inspection of external nose and ears tmx nl  OP : no lesion edema or exudate  NECK: no obvious masses on inspection palpation  LUNGS: clear to auscultation bilaterally, no wheezes, rales or rhonchi,  CV: HRRR, no clubbing cyanosis or  peripheral edema nl cap refill  MS: moves all extremities without noticeable focal  abnormality PSYCH: pleasant and cooperative, no obvious depression or anxiety  BP Readings from Last 3 Encounters:  04/15/18 124/72  04/07/18 124/83  11/25/17 116/68  cxray nad  Reviewed with patient .   ASSESSMENT AND PLAN:  Discussed the following assessment and plan:  Cough, persistent - Plan: DG Chest 2 View  Mild reactive airways disease, unspecified whether persistent  Post-infection bronchospasm Suspect post infectious cough with rad  Findings   Empiric  Steroid  Avoid cough drops  Has itching in throat consider adding trial antihistamine .  pulm status is stable.  -Patient advised to return or notify health care team  if  new concerns arise.  Patient Instructions  X ray is normal   Acts like a post infectious  Reactive airway cough .   Prednisone.  For 5 days   To calm down  .  Ok to try antihistamine.   Neta Mends. Thania Woodlief M.D.

## 2018-04-15 ENCOUNTER — Ambulatory Visit (INDEPENDENT_AMBULATORY_CARE_PROVIDER_SITE_OTHER): Payer: 59

## 2018-04-15 ENCOUNTER — Ambulatory Visit (INDEPENDENT_AMBULATORY_CARE_PROVIDER_SITE_OTHER): Payer: 59 | Admitting: Internal Medicine

## 2018-04-15 ENCOUNTER — Encounter: Payer: Self-pay | Admitting: Internal Medicine

## 2018-04-15 VITALS — BP 124/72 | HR 65 | Temp 97.9°F | Wt 201.0 lb

## 2018-04-15 DIAGNOSIS — R053 Chronic cough: Secondary | ICD-10-CM

## 2018-04-15 DIAGNOSIS — J9801 Acute bronchospasm: Secondary | ICD-10-CM | POA: Diagnosis not present

## 2018-04-15 DIAGNOSIS — R05 Cough: Secondary | ICD-10-CM

## 2018-04-15 DIAGNOSIS — J45909 Unspecified asthma, uncomplicated: Secondary | ICD-10-CM | POA: Diagnosis not present

## 2018-04-15 MED ORDER — PREDNISONE 20 MG PO TABS: 20.0000 mg | ORAL_TABLET | Freq: Two times a day (BID) | ORAL | 0 refills | Status: DC

## 2018-04-15 NOTE — Patient Instructions (Signed)
X ray is normal   Acts like a post infectious  Reactive airway cough .   Prednisone.  For 5 days   To calm down  .  Ok to try antihistamine.

## 2018-04-21 NOTE — Progress Notes (Signed)
Office Visit Note  Patient: Autumn Cruz             Date of Birth: 1975/04/10           MRN: [id number]             PCP: Madelin Headings, MD Referring: Madelin Headings, MD Visit Date: 05/05/2018 Occupation: @GUAROCC @  Subjective:  Left plantar fasciitis   History of Present Illness: Berdella Preis is a 43 y.o. female with history of osteoarthritis and DDD.  She reports she continues to have symptoms of left plantar fasciitis but the symptoms are more mild.  She reports that physical therapy helped.  She wears orthotics.  She has not required another cortisone injection.  She continues to perform exercises on a regular basis.  She states both knee joints feel better.  She is able to kneel when praying, which she was previously unable to do.  She has intermittent knee joint swelling if standing for prolonged periods.  She takes tylenol PM if she experiences increased discomfort.  She states that the ischial bursitis of the right side has resolved. She states PT was very helpful.  She uses a cushion if she has increased discomfort.   She denies any neck pain at this time.  She has no symptoms of radiculopathy.  She has trapezius muscle spasms occasionally and takes Zanaflex which is beneficial.  She is having significantly less morning stiffness.   Activities of Daily Living:  Patient reports morning stiffness for 2  minutes.   Patient Denies nocturnal pain.  Difficulty dressing/grooming: Denies Difficulty climbing stairs: Denies Difficulty getting out of chair: Reports Difficulty using hands for taps, buttons, cutlery, and/or writing: Denies  Review of Systems  Constitutional: Positive for fatigue.  HENT: Negative for mouth sores, mouth dryness and nose dryness.   Eyes: Negative for pain, visual disturbance and dryness.  Respiratory: Negative for cough, hemoptysis, shortness of breath and difficulty breathing.   Cardiovascular: Positive for swelling in legs/feet. Negative for chest pain,  palpitations and hypertension.  Gastrointestinal: Negative for blood in stool, constipation and diarrhea.  Endocrine: Negative for increased urination.  Genitourinary: Negative for painful urination.  Musculoskeletal: Positive for arthralgias, joint pain, joint swelling and morning stiffness. Negative for myalgias, muscle weakness, muscle tenderness and myalgias.  Skin: Negative for color change, pallor, rash, hair loss, nodules/bumps, skin tightness, ulcers and sensitivity to sunlight.  Allergic/Immunologic: Negative for susceptible to infections.  Neurological: Negative for dizziness, numbness, headaches and weakness.  Hematological: Negative for swollen glands.  Psychiatric/Behavioral: Positive for sleep disturbance. Negative for depressed mood. The patient is not nervous/anxious.     PMFS History:  Patient Active Problem List   Diagnosis Date Noted  . Fibromatosis, plantar 07/10/2017  . DDD (degenerative disc disease), cervical 05/22/2017  . Primary osteoarthritis of both knees 05/22/2017  . Polyarthralgia 02/28/2017  . Degenerative tear of medial meniscus, right 04/09/2016  . Degenerative arthritis of knee, bilateral 01/31/2016  . Skin lesion of left leg 05/30/2014  . Hx gestational diabetes 02/04/2013  . Visit for preventive health examination 02/04/2013  . Long term (current) use of anticoagulants 09/29/2012  . Family history of premature CAD 09/24/2012  . Hyperlipidemia 09/24/2012  . Hx of thrombosis of lower extremity in pregnancy 09/24/2012  . Hypercoagulable state (HCC) 09/24/2012  . Factor V Leiden mutation (HCC) 09/24/2012  . Elevated C-reactive protein (CRP) 09/24/2012  . Post-phlebitic syndrome 09/24/2012    Past Medical History:  Diagnosis Date  . Arthritis   .  DVT (deep venous thrombosis) (HCC)   . Gestational diabetes   . High cholesterol   . Hx of thrombosis 2014   Left lower extrm.  Marland Kitchen Positive TB test   . Post-phlebitic syndrome 2014   Left  LE  .  Thrombosis    Left leg preg tulip filter then removed.   . Tuberculosis    Positive test    Family History  Problem Relation Age of Onset  . Heart disease Mother        Blockage/Main artery  . Diabetes Mother   . Coronary artery disease Mother   . Hyperlipidemia Mother   . Hypertension Father   . Deep vein thrombosis Father   . Renal Disease Sister   . High Cholesterol Sister   . High Cholesterol Sister   . High Cholesterol Sister    Past Surgical History:  Procedure Laterality Date  . CESAREAN SECTION  2010 & 2012   Social History   Social History Narrative   Usually gets 6-7 hours of sleep per night   4 people living in the home.   Originally from [place] has a bachelor's degree some training in nursing and alternative medicine.   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   Negative tobacco alcohol some caffeine   Gravida 2 para 2 last Pap 2013   Think she had T. 2012   Immunization History  Administered Date(s) Administered  . Influenza,inj,Quad PF,6+ Mos 02/04/2013, 05/28/2014, 12/27/2015, 11/25/2017  . Influenza-Unspecified 02/11/2015  . Tdap 04/03/2010     Objective: Vital Signs: BP (!) 148/89 (BP Location: Left Arm, Patient Position: Sitting, Cuff Size: Normal)   Pulse 80   Resp 12   Ht 5\' 5"  (1.651 m)   Wt 198 lb (89.8 kg)   LMP 04/05/2018   BMI 32.95 kg/m    Physical Exam Vitals signs and nursing note reviewed.  Constitutional:      Appearance: She is well-developed.  HENT:     Head: Normocephalic and atraumatic.  Eyes:     Conjunctiva/sclera: Conjunctivae normal.  Neck:     Musculoskeletal: Normal range of motion.  Cardiovascular:     Rate and Rhythm: Normal rate and regular rhythm.     Heart sounds: Normal heart sounds.  Pulmonary:     Effort: Pulmonary effort is normal.     Breath sounds: Normal breath sounds.  Abdominal:      General: Bowel sounds are normal.     Palpations: Abdomen is soft.  Lymphadenopathy:     Cervical: No cervical adenopathy.  Skin:    General: Skin is warm and dry.     Capillary Refill: Capillary refill takes less than 2 seconds.  Neurological:     Mental Status: She is alert and oriented to person, place, and time.  Psychiatric:        Behavior: Behavior normal.      Musculoskeletal Exam: C-spine, thoracic spine, and lumbar spine good ROM . No midline spinal tenderness.  No SI joint tenderness.  Shoulder joints, elbow joints, wrist joints, MCPs, PIPs, and DIPs good ROM with no synovitis.  Complete fist formation bilaterally.  Hip joints, knee joints, ankle joints, MTPs, PIPs, and DIPs good ROM with no synovitis.  No warmth or effusion of knee joints.  No tenderness or swelling of ankle joints. Left plantar fasciitis.   CDAI Exam: CDAI Score: Not documented  Patient Global Assessment: Not documented; Provider Global Assessment: Not documented Swollen: Not documented; Tender: Not documented Joint Exam   Not documented   There is currently no information documented on the homunculus. Go to the Rheumatology activity and complete the homunculus joint exam.  Investigation: No additional findings.  Imaging: Dg Chest 2 View  Result Date: 04/15/2018 CLINICAL DATA:  Cough. EXAM: CHEST - 2 VIEW COMPARISON:  Radiographs of Aug 01, 2015. FINDINGS: The heart size and mediastinal contours are within normal limits. Both lungs are clear. The visualized skeletal structures are unremarkable. IMPRESSION: No active cardiopulmonary disease. Electronically Signed   By: Lupita RaiderJames  Green Jr, M.D.   On: 04/15/2018 09:09    Recent Labs: Lab Results  Component Value Date   WBC 6.0 03/04/2018   HGB 14.4 03/04/2018   PLT 212.0 03/04/2018   NA 138 03/04/2018   K 4.2 03/04/2018   CL 101 03/04/2018   CO2 28 03/04/2018   GLUCOSE 90 03/04/2018   BUN 11 03/04/2018   CREATININE 0.73 03/04/2018   BILITOT 1.2  03/04/2018   ALKPHOS 64 03/04/2018   AST 16 03/04/2018   ALT 19 03/04/2018   PROT 7.5 03/04/2018   ALBUMIN 4.3 03/04/2018   CALCIUM 9.5 03/04/2018    Speciality Comments: No specialty comments available.  Procedures:  No procedures performed Allergies: Patient has no known allergies.   Assessment / Plan:     Visit Diagnoses: Primary osteoarthritis of both knees - Bilateral moderate with moderate chondromalacia patella. Status post Visco supplement and PRP inj: She has no warmth or effusion.  She has good ROM with no discomfort.  She has less difficulty climbing steps and getting up from a chair.  She has been able to kneel to pray which she was unable to do previously.  She does not need a cortisone injection at this time.  She was advised to notify us if she develops increased joint pain or joint swelling.  She will follow up in 1 year.   DDD (degenerative disc disease), cervical - Moderate spinal stenosis and foraminal stenosis C6-C7 on MRI 2017: She has good ROM with no discomfort at this time.  She has no symptoms of radiculopathy at this time.   Plantar fasciitis - left. She had ultrasound performed by Dr. Alveda ReasonsZackery that revealed plantar fascial fibromatosis.  She wears orthotics and has underwent PT.  Her symptoms are mild at this time.  She performs stretching exercises on a regular basis.    Ischial bursitis of right side - Resolved.  She was referred to PT which resolved her symptoms.  She uses a cushion at times, which helps.   Rash - History of rash on lower extremity for the last 2 years.  Followed up by dermatology.  The skin biopsy report: Lichenoid dermatitis.  Factor V Leiden mutation (HCC)  History of positive PPD - Chest x-ray normal per patient   Mixed hyperlipidemia  Hx of thrombosis of lower extremity in pregnancy - Autoimmune workup was negative   Orders: No orders of the defined types were placed in this encounter.  No orders of the defined types were placed  in this encounter.    Follow-Up Instructions: Return in about 1 year (around 05/06/2019) for Osteoarthritis, DDD.   Gearldine Bienenstockaylor M Edison Wollschlager, PA-C   I examined and evaluated the patient with Sherron Alesaylor Arline Ketter PA.  Patient states that her symptoms had improved.  She had no synovitis on my examination today.  We have advised her to contact us in  case her symptoms get worse.  The plan of care was discussed as noted above.  Pollyann Savoy, MD Note - This record has been created using Animal nutritionist.  Chart creation errors have been sought, but may not always  have been located. Such creation errors do not reflect on  the standard of medical care.

## 2018-05-05 ENCOUNTER — Ambulatory Visit (INDEPENDENT_AMBULATORY_CARE_PROVIDER_SITE_OTHER): Payer: 59 | Admitting: Rheumatology

## 2018-05-05 ENCOUNTER — Encounter: Payer: Self-pay | Admitting: Rheumatology

## 2018-05-05 VITALS — BP 148/89 | HR 80 | Resp 12 | Ht 65.0 in | Wt 198.0 lb

## 2018-05-05 DIAGNOSIS — M7071 Other bursitis of hip, right hip: Secondary | ICD-10-CM | POA: Diagnosis not present

## 2018-05-05 DIAGNOSIS — M722 Plantar fascial fibromatosis: Secondary | ICD-10-CM | POA: Diagnosis not present

## 2018-05-05 DIAGNOSIS — M5030 Other cervical disc degeneration, unspecified cervical region: Secondary | ICD-10-CM | POA: Diagnosis not present

## 2018-05-05 DIAGNOSIS — Z9289 Personal history of other medical treatment: Secondary | ICD-10-CM

## 2018-05-05 DIAGNOSIS — E782 Mixed hyperlipidemia: Secondary | ICD-10-CM

## 2018-05-05 DIAGNOSIS — M170 Bilateral primary osteoarthritis of knee: Secondary | ICD-10-CM | POA: Diagnosis not present

## 2018-05-05 DIAGNOSIS — R21 Rash and other nonspecific skin eruption: Secondary | ICD-10-CM

## 2018-05-05 DIAGNOSIS — Z86718 Personal history of other venous thrombosis and embolism: Secondary | ICD-10-CM

## 2018-05-05 DIAGNOSIS — D6851 Activated protein C resistance: Secondary | ICD-10-CM

## 2018-06-18 DIAGNOSIS — R05 Cough: Secondary | ICD-10-CM | POA: Diagnosis not present

## 2018-06-18 DIAGNOSIS — L209 Atopic dermatitis, unspecified: Secondary | ICD-10-CM | POA: Diagnosis not present

## 2018-06-18 DIAGNOSIS — J3089 Other allergic rhinitis: Secondary | ICD-10-CM | POA: Diagnosis not present

## 2018-06-25 ENCOUNTER — Ambulatory Visit (INDEPENDENT_AMBULATORY_CARE_PROVIDER_SITE_OTHER): Payer: 59 | Admitting: Family Medicine

## 2018-06-25 ENCOUNTER — Other Ambulatory Visit: Payer: Self-pay

## 2018-06-25 DIAGNOSIS — J988 Other specified respiratory disorders: Secondary | ICD-10-CM | POA: Diagnosis not present

## 2018-06-25 NOTE — Progress Notes (Signed)
Patient ID: Autumn Cruz, female   DOB: June 24, 1975, 43 y.o.   MRN: [id number]  Virtual Visit via Video Note  I connected with Autumn Cruz on 06/25/18 at  5:45 PM EDT by a video enabled telemedicine application and verified that I am speaking with the correct person using two identifiers.  Location patient: home Location provider:work or home office Persons participating in the virtual visit: patient, provider  I discussed the limitations of evaluation and management by telemedicine and the availability of in person appointments. The patient expressed understanding and agreed to proceed.   HPI: Patient relates several month history of what she describes as a "itchy throat ".  She denies any true sore throat.  She has had some occasional dry cough which is fairly chronic.  She saw allergist just recently and was started on cetirizine 10 mg daily and Symbicort inhaler.  She denies any fever.  No recent travels.  No consistent cough.  No dyspnea.  Denies any active postnasal drip symptoms.  No clear hx of asthma.   Patient has never smoked.  No hoarseness.  Occasional GERD symptoms.  She has not tried any acid suppressors.  No dysphagia.  No appetite or weight changes.  Patient had chest x-ray back in January which showed no acute findings.   ROS: See pertinent positives and negatives per HPI.  Past Medical History:  Diagnosis Date  . Arthritis   . DVT (deep venous thrombosis) (HCC)   . Gestational diabetes   . High cholesterol   . Hx of thrombosis 2014   Left lower extrm.  Marland Kitchen Positive TB test   . Post-phlebitic syndrome 2014   Left  LE  . Thrombosis    Left leg preg tulip filter then removed.   . Tuberculosis    Positive test    Past Surgical History:  Procedure Laterality Date  . CESAREAN SECTION  2010 & 2012    Family History  Problem Relation Age of Onset  . Heart disease Mother        Blockage/Main artery  . Diabetes Mother   . Coronary artery disease Mother   .  Hyperlipidemia Mother   . Hypertension Father   . Deep vein thrombosis Father   . Renal Disease Sister   . High Cholesterol Sister   . High Cholesterol Sister   . High Cholesterol Sister     SOCIAL HX: Non-smoker   Current Outpatient Medications:  .  albuterol (PROVENTIL HFA;VENTOLIN HFA) 108 (90 Base) MCG/ACT inhaler, Inhale 2 puffs into the lungs every 6 (six) hours as needed for wheezing or shortness of breath., Disp: 1 Inhaler, Rfl: 0 .  aspirin 81 MG tablet, Take 81 mg by mouth daily., Disp: , Rfl:  .  enoxaparin (LOVENOX) 40 MG/0.4ML injection, Inject 0.4 mLs (40 mg total) into the skin daily. (Patient taking differently: Inject 40 mg into the skin as needed. ), Disp: 20 Syringe, Rfl: 0 .  Multiple Vitamin (MULTI-VITAMIN PO), Take by mouth., Disp: , Rfl:  .  rosuvastatin (CRESTOR) 20 MG tablet, rosuvastatin 20 mg tablet, Disp: , Rfl:  .  tizanidine (ZANAFLEX) 2 MG capsule, Take 1 capsule (2 mg total) by mouth 2 (two) times daily as needed for muscle spasms., Disp: 60 capsule, Rfl: 3  EXAM:  VITALS per patient if applicable:  GENERAL: alert, oriented, appears well and in no acute distress  HEENT: atraumatic, conjunttiva clear, no obvious abnormalities on inspection of external nose and ears  NECK: normal movements of the head  and neck  LUNGS: on inspection no signs of respiratory distress, breathing rate appears normal, no obvious gross SOB, gasping or wheezing  CV: no obvious cyanosis  MS: moves all visible extremities without noticeable abnormality  PSYCH/NEURO: pleasant and cooperative, no obvious depression or anxiety, speech and thought processing grossly intact  ASSESSMENT AND PLAN:  Discussed the following assessment and plan:  Patient relates several month history of upper airway irritation.  She does not have any red flags such as fever, weight loss, persistent cough, dyspnea, or any recent travels.  Suspect she may have some upper airway irritation related to  silent GERD.  -Recommend trial of over-the-counter Prilosec or Nexium 20 mg daily -She will continue with her daily antihistamine -Consider elevate head of bed 4 to 6 inches -Avoid eating within 2 to 3 hours of bedtime -Discussed different foods that can trigger silent GERD -Follow-up with primary if symptoms persist over the next several weeks     I discussed the assessment and treatment plan with the patient. The patient was provided an opportunity to ask questions and all were answered. The patient agreed with the plan and demonstrated an understanding of the instructions.   The patient was advised to call back or seek an in-person evaluation if the symptoms worsen or if the condition fails to improve as anticipated.  I provided 17 minutes of non-face-to-face time during this encounter.   Autumn Peat, MD

## 2019-02-13 LAB — CBC AND DIFFERENTIAL
HCT: 42 (ref 36–46)
Hemoglobin: 14.1 (ref 12.0–16.0)
Neutrophils Absolute: 58
Platelets: 227 (ref 150–399)
WBC: 7.4

## 2019-02-13 LAB — HEPATIC FUNCTION PANEL
ALT: 20 (ref 7–35)
AST: 15 (ref 13–35)
Alkaline Phosphatase: 79 (ref 25–125)
Bilirubin, Total: 0.7

## 2019-02-13 LAB — BASIC METABOLIC PANEL
BUN: 12 (ref 4–21)
CO2: 22 (ref 13–22)
Chloride: 101 (ref 99–108)
Creatinine: 0.9 (ref 0.5–1.1)
Glucose: 145
Potassium: 4.1 (ref 3.4–5.3)
Sodium: 139 (ref 137–147)

## 2019-02-13 LAB — LIPID PANEL
Cholesterol: 238 — AB (ref 0–200)
HDL: 54 (ref 35–70)
LDL Cholesterol: 166
LDl/HDL Ratio: 3.1
Triglycerides: 101 (ref 40–160)

## 2019-02-13 LAB — COMPREHENSIVE METABOLIC PANEL
Albumin: 4.4 (ref 3.5–5.0)
Calcium: 9.6 (ref 8.7–10.7)
Globulin: 3.3

## 2019-02-13 LAB — HEMOGLOBIN A1C: Hemoglobin A1C: 5.6

## 2019-02-13 LAB — CBC: RBC: 4.47 (ref 3.87–5.11)

## 2019-02-13 LAB — TSH: TSH: 1.16 (ref 0.41–5.90)

## 2019-02-19 ENCOUNTER — Encounter: Payer: Self-pay | Admitting: Internal Medicine

## 2019-03-26 ENCOUNTER — Telehealth: Payer: Self-pay

## 2019-03-26 NOTE — Telephone Encounter (Signed)
Copied from [place] [phone] [phone]. Topic: Appointment Scheduling - Scheduling Inquiry for Clinic >> Mar 26, 2019  9:58 AM Mathis Bud wrote: Reason for CRM: Patient is requesting to get CPE done before the end of the year.   Call back [phone]

## 2019-03-26 NOTE — Telephone Encounter (Signed)
Told pt provider is currently out of office and do not know at this moment if she will be in office next week offered January appt pt did not accept at this time

## 2019-04-20 ENCOUNTER — Encounter: Payer: Self-pay | Admitting: Internal Medicine

## 2019-04-27 NOTE — Progress Notes (Signed)
Virtual Visit via Telephone Note  I connected with Brittyn Maher on 04/29/19 at  1:00 PM EST by telephone and verified that I am speaking with the correct person using two identifiers.  Location: Patient: Home  Provider: Clinic  This service was conducted via virtual visit. The patient was located at home. I was located in my office.  Consent was obtained prior to the virtual visit and is aware of possible charges through their insurance for this visit.  The patient is an established patient.  Dr. Estanislado Pandy, MD conducted the virtual visit and Hazel Sams, PA-C acted as scribe during the service.  Office staff helped with scheduling follow up visits after the service was conducted.     I discussed the limitations, risks, security and privacy concerns of performing an evaluation and management service by telephone and the availability of in person appointments. I also discussed with the patient that there may be a patient responsible charge related to this service. The patient expressed understanding and agreed to proceed.  CC: Pain in both knee joints  History of Present Illness: Patient is a 44 year old female with a past medical history of osteoarthritis and DDD. She has intermittent pain in both knee joints, which is exacerbated by climbing steps.  She is not having any joint swelling or mechanical symptoms at this time.  She states her neck pain has improved. She takes zanalfex very rarely.  If she is experiencing severe pain she takes tylenol as needed for pain relief.  Her ischial bursitis has improved.  She has not needed to use a cushion anymore.   Review of Systems  Constitutional: Positive for malaise/fatigue. Negative for fever.  HENT: Negative for ear pain.   Eyes: Negative for photophobia, pain, discharge and redness.  Respiratory: Negative for cough, shortness of breath and wheezing.   Cardiovascular: Negative for chest pain and palpitations.  Gastrointestinal: Negative for blood in  stool, constipation and diarrhea.  Genitourinary: Negative for dysuria and urgency.  Musculoskeletal: Positive for joint pain. Negative for back pain, myalgias and neck pain.  Skin: Positive for rash.  Neurological: Negative for dizziness, weakness and headaches.  Psychiatric/Behavioral: Negative for depression. The patient is not nervous/anxious and does not have insomnia.       Observations/Objective: Physical Exam  Constitutional: She is oriented to person, place, and time.  Neurological: She is alert and oriented to person, place, and time.  Psychiatric: Mood, memory, affect and judgment normal.   Patient reports morning stiffness for 0 minutes.   Patient denies nocturnal pain.  Difficulty dressing/grooming: Denies Difficulty climbing stairs: Denies Difficulty getting out of chair: Denies Difficulty using hands for taps, buttons, cutlery, and/or writing: Denies   Assessment and Plan: Visit Diagnoses: Primary osteoarthritis of both knees - Bilateral moderate with moderate chondromalacia patella. Status post Visco supplement and PRP inj: She continues to have intermittent discomfort in both knee joints, which is exacerbated by climbing steps.  She rarely experiences nocturnal pain.  She has not had any mechanical symptoms.  We discussed the importance of lower extremity muscle strengthening.  She will follow up in 1 year.  DDD (degenerative disc disease), cervical - Moderate spinal stenosis and foraminal stenosis C6-C7 on MRI 2017: Her neck pain has improved.  She is not having any symptoms of radiculopathy. She has not needed to take zanaflex recently.   Plantar fasciitis - left. She had ultrasound performed by Dr. Toula Moos that revealed plantar fascial fibromatosis.  She wears orthotics and has underwent PT.  Resolved. She has no discomfort at this time.   Ischial bursitis of right side - Improved. She has not needed to sit on a cushion anymore.   Rash - History of rash on lower  extremity for the last 3 years.  She has not had any new or worsening symptoms. The skin biopsy report: Lichenoid dermatitis.  Other medical conditions are listed as follows:   Factor V Leiden mutation (HCC)  History of positive PPD - Chest x-ray normal per patient   Mixed hyperlipidemia  Hx of thrombosis of lower extremity in pregnancy - Autoimmune workup was negative   Follow Up Instructions: She will follow up in 1 year   I discussed the assessment and treatment plan with the patient. The patient was provided an opportunity to ask questions and all were answered. The patient agreed with the plan and demonstrated an understanding of the instructions.   The patient was advised to call back or seek an in-person evaluation if the symptoms worsen or if the condition fails to improve as anticipated.  I provided 15 minutes of non-face-to-face time during this encounter.   Pollyann Savoy, MD   Scribed by-  Sherron Ales, PA-C

## 2019-04-29 ENCOUNTER — Telehealth (INDEPENDENT_AMBULATORY_CARE_PROVIDER_SITE_OTHER): Payer: 59 | Admitting: Rheumatology

## 2019-04-29 ENCOUNTER — Other Ambulatory Visit: Payer: Self-pay

## 2019-04-29 ENCOUNTER — Encounter: Payer: Self-pay | Admitting: Rheumatology

## 2019-04-29 DIAGNOSIS — Z86718 Personal history of other venous thrombosis and embolism: Secondary | ICD-10-CM

## 2019-04-29 DIAGNOSIS — M722 Plantar fascial fibromatosis: Secondary | ICD-10-CM

## 2019-04-29 DIAGNOSIS — Z9289 Personal history of other medical treatment: Secondary | ICD-10-CM

## 2019-04-29 DIAGNOSIS — E782 Mixed hyperlipidemia: Secondary | ICD-10-CM

## 2019-04-29 DIAGNOSIS — M5030 Other cervical disc degeneration, unspecified cervical region: Secondary | ICD-10-CM | POA: Diagnosis not present

## 2019-04-29 DIAGNOSIS — D6851 Activated protein C resistance: Secondary | ICD-10-CM

## 2019-04-29 DIAGNOSIS — R21 Rash and other nonspecific skin eruption: Secondary | ICD-10-CM

## 2019-04-29 DIAGNOSIS — M170 Bilateral primary osteoarthritis of knee: Secondary | ICD-10-CM

## 2019-04-29 DIAGNOSIS — M23203 Derangement of unspecified medial meniscus due to old tear or injury, right knee: Secondary | ICD-10-CM | POA: Diagnosis not present

## 2019-04-29 DIAGNOSIS — M7071 Other bursitis of hip, right hip: Secondary | ICD-10-CM

## 2019-05-04 ENCOUNTER — Telehealth: Payer: 59 | Admitting: Rheumatology

## 2019-05-26 ENCOUNTER — Other Ambulatory Visit: Payer: Self-pay

## 2019-05-26 NOTE — Progress Notes (Signed)
This visit occurred during the SARS-CoV-2 public health emergency.  Safety protocols were in place, including screening questions prior to the visit, additional usage of staff PPE, and extensive cleaning of exam room while observing appropriate contact time as indicated for disinfecting solutions.    Chief Complaint  Patient presents with   Annual Exam    Pt has no concerns today     HPI: Patient  Autumn Cruz  44 y.o. comes in today for Preventive Health Care visit  And form for screening  Has seen 2 derm about leg rash and felt poss from cholesterol med? No change I such is still bothering her  Had bx of skin and allergic   Suggestion may be to use a laser for rx and has failed other  rx   Kids are at home  Virtual  Stressful and father in [place] although  Taken care of has a serious prob fatal lvier mass  And  She cannot travel to see him  Pah Curator .   Has been irritable with children and at home   Adapting as best possible but  Interested if medication could help   No bleeding or clotting  Health Maintenance  Topic Date Due   HIV Screening  07/[birth year]/1989   INFLUENZA VACCINE  07/01/2019 (Originally 11/01/2018)   PAP SMEAR-Modifier  08/30/2019   TETANUS/TDAP  04/03/2020   Health Maintenance Review LIFESTYLE:  Exercise:   cleaning house    Tobacco/ETS:no Alcohol:  no Sugar beverages: no Sleep:about     Off and on   .  Drug use: no HH of   4    No pets .  11 and 9     Dad  Is not  Well.   [place] and cant travel.   ROS:  GEN/ HEENT: No fever, significant weight changes sweats headaches vision problems hearing changes, CV/ PULM; No chest pain shortness of breath cough, syncope,edema  change in exercise tolerance. GI /GU: No adominal pain, vomiting, change in bowel habits. No blood in the stool. No significant GU symptoms. SKIN/HEME: ,no acute skin rashes suspicious lesions or bleeding. No lymphadenopathy, nodules, masses.  See  hpi NEURO/ PSYCH:  No  neurologic signs such as weakness numbness. IMM/ Allergy: No unusual infections.  Allergy .   REST of 12 system review negative except as per HPI   Past Medical History:  Diagnosis Date   Arthritis    DVT (deep venous thrombosis) (HCC)    Gestational diabetes    High cholesterol    Hx of thrombosis 2014   Left lower extrm.   Positive TB test    Post-phlebitic syndrome 2014   Left  LE   Thrombosis    Left leg preg tulip filter then removed.    Tuberculosis    Positive test    Past Surgical History:  Procedure Laterality Date   CESAREAN SECTION  2010 & 2012    Family History  Problem Relation Age of Onset   Heart disease Mother        Blockage/Main artery   Diabetes Mother    Coronary artery disease Mother    Hyperlipidemia Mother    Hypertension Father    Deep vein thrombosis Father    Renal Disease Sister    High Cholesterol Sister    High Cholesterol Sister    High Cholesterol Sister     Social History   Socioeconomic History   Marital status: Married    Spouse name: Autumn Cruz  Number of children: 2   Years of education: Not on file   Highest education level: Not on file  Occupational History   Occupation: Homemaker  Tobacco Use   Smoking status: Never Smoker   Smokeless tobacco: Never Used  Substance and Sexual Activity   Alcohol use: No    Alcohol/week: 0.0 standard drinks   Drug use: No   Sexual activity: Not on file  Other Topics Concern   Not on file  Social History Narrative   Usually gets 6-7 hours of sleep per night   4 people living in the home.   Originally from [place] has a bachelor's degree some training in nursing and alternative medicine.   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   Negative tobacco alcohol some caffeine   Gravida 2 para 2 last Pap 2013   Think she had T. 2012   Social  Determinants of Health   Financial Resource Strain:    Difficulty of Paying Living Expenses: Not on file  Food Insecurity:    Worried About Charity fundraiser in the Last Year: Not on file   [employer] of Food in the Last Year: Not on file  Transportation Needs:    Lack of Transportation (Medical): Not on file   Lack of Transportation (Non-Medical): Not on file  Physical Activity:    Days of Exercise per Week: Not on file   Minutes of Exercise per Session: Not on file  Stress:    Feeling of Stress : Not on file  Social Connections:    Frequency of Communication with Friends and Family: Not on file   Frequency of Social Gatherings with Friends and Family: Not on file   Attends Religious Services: Not on file   Active Member of Clubs or Organizations: Not on file   Attends Archivist Meetings: Not on file   Marital Status: Not on file    Outpatient Medications Prior to Visit  Medication Sig Dispense Refill   albuterol (PROVENTIL HFA;VENTOLIN HFA) 108 (90 Base) MCG/ACT inhaler Inhale 2 puffs into the lungs every 6 (six) hours as needed for wheezing or shortness of breath. 1 Inhaler 0   aspirin 81 MG tablet Take 81 mg by mouth as needed.      enoxaparin (LOVENOX) 40 MG/0.4ML injection Inject 0.4 mLs (40 mg total) into the skin daily. (Patient taking differently: Inject 40 mg into the skin as needed. ) 20 Syringe 0   Multiple Vitamin (MULTI-VITAMIN PO) Take by mouth.     No facility-administered medications prior to visit.     EXAM:  BP 122/76 (BP Location: Right Arm, Patient Position: Sitting, Cuff Size: Normal)    Pulse 82    Temp 98 F (36.7 C) (Temporal)    Ht '5\' 5"'  (1.651 m)    Wt 205 lb 9.6 oz (93.3 kg)    SpO2 98%    BMI 34.21 kg/m   Body mass index is 34.21 kg/m. Wt Readings from Last 3 Encounters:  05/27/19 205 lb 9.6 oz (93.3 kg)  05/05/18 198 lb (89.8 kg)  04/15/18 201 lb (91.2 kg)    Physical Exam: Vital signs reviewed LFY:BOFB is a  well-developed well-nourished alert cooperative    who appearsr stated age in no acute distress.  HEENT: normocephalic atraumatic , Eyes: PERRL EOM's full, conjunctiva clear, ., Ears: no deformity EAC's clear TMs  with normal landmarks. Mouth: clear OPmasked NECK: supple without masses, thyromegaly or bruits. CHEST/PULM:  Clear to auscultation and percussion breath sounds equal no wheeze , rales or rhonchi. No chest wall deformities or tenderness. Breast: normal by inspection . No dimpling, discharge, masses, tenderness or discharge . CV: PMI is nondisplaced, S1 S2 no gallops, murmurs, rubs. Peripheral pulses are full without delay.No JVD .  ABDOMEN: Bowel sounds normal nontender  No guard or rebound, no hepato splenomegal no CVA tenderness.  No hernia. Extremtities:  No clubbing cyanosis or edema, no acute joint swelling or redness no focal atrophy   NEURO:  Oriented x3, cranial nerves 3-12 appear to be intact, no obvious focal weakness,gait within normal limits no abnormal reflexes or asymmetrical SKIN: No acute rashes   Lichenified rash hyper pigmented  Along left lateral leg  About 01 cm?   Dry  normal turgor, color, no bruising or petechiae. PSYCH: Oriented, good eye contact, , cognition and judgment appear normal.  LN: no cervical axillary inguinal adenopathy  Lab Results  Component Value Date   WBC 7.4 02/13/2019   HGB 14.1 02/13/2019   HCT 42 02/13/2019   PLT 227 02/13/2019   GLUCOSE 90 03/04/2018   CHOL 238 (A) 02/13/2019   TRIG 101 02/13/2019   HDL 54 02/13/2019   LDLDIRECT 200.7 02/04/2013   LDLCALC 166 02/13/2019   ALT 20 02/13/2019   AST 15 02/13/2019   NA 139 02/13/2019   K 4.1 02/13/2019   CL 101 02/13/2019   CREATININE 0.9 02/13/2019   BUN 12 02/13/2019   CO2 22 02/13/2019   TSH 1.16 02/13/2019   HGBA1C 5.6 02/13/2019    BP Readings from Last 3 Encounters:  05/27/19 122/76  05/05/18 (!) 148/89  04/15/18 124/72    Plan fu labs   Fasting for  Insurance form     ASSESSMENT AND PLAN:  Discussed the following assessment and plan:    ICD-10-CM   1. Visit for preventive health examination  Z00.00 Lipid panel    TSH    CBC with Differential/Platelet    CMP    Hemoglobin A1c  2. Hypercoagulable state ([place])  D68.59 Lipid panel    TSH    CBC with Differential/Platelet    CMP    Hemoglobin A1c  3. Hx of thrombosis of lower extremity in pregnancy  Z86.718 Lipid panel    TSH    CBC with Differential/Platelet    CMP    Hemoglobin A1c  4. Irritability  R45.4   5. Hx gestational diabetes  Z86.32 Lipid panel    TSH    CBC with Differential/Platelet    CMP    Hemoglobin A1c  6. Adjustment disorder with other symptom  F43.29   7. Hyperlipidemia, unspecified hyperlipidemia type  E78.5 Lipid panel    TSH    CBC with Differential/Platelet    CMP    Hemoglobin A1c  8. Stress due to illness of family member  Z55.79    Ok to refer for rash skin lesions when ready   Trial citalopram 10 and fu in 3 weeks Virtual ok   Risk benefit of medication discussed.  reconsider lipid meds but   May have had se with rash  Patient Care Team: Azari Janssens, Standley Brooking, MD as PCP - General (Internal Medicine) Beryle Beams Alyson Locket, MD as Consulting Physician (Oncology) Elam Dutch, MD as Consulting Physician (Vascular Surgery) Bobbye Charleston, MD as Consulting Physician (Obstetrics and Gynecology) Bo Merino, MD as Consulting Physician (Rheumatology)  Patient Instructions  Get appt for fasting lab  And will complete form .  Can try  Low dose citalopram every day  And plan Virtual visit med eval in about 3 weeks or as needed.   Let me know about derm or skin referral   When ready .     Preventive Care 53-80 Years Old, Female Preventive care refers to visits with your health care provider and lifestyle choices that can promote health and wellness. This includes:  A yearly physical exam. This may also be called an annual well check.  Regular dental  visits and eye exams.  Immunizations.  Screening for certain conditions.  Healthy lifestyle choices, such as eating a healthy diet, getting regular exercise, not using drugs or products that contain nicotine and tobacco, and limiting alcohol use. What can I expect for my preventive care visit? Physical exam Your health care provider will check your:  Height and weight. This may be used to calculate body mass index (BMI), which tells if you are at a healthy weight.  Heart rate and blood pressure.  Skin for abnormal spots. Counseling Your health care provider may ask you questions about your:  Alcohol, tobacco, and drug use.  Emotional well-being.  Home and relationship well-being.  Sexual activity.  Eating habits.  Work and work Statistician.  Method of birth control.  Menstrual cycle.  Pregnancy history. What immunizations do I need?  Influenza (flu) vaccine  This is recommended every year. Tetanus, diphtheria, and pertussis (Tdap) vaccine  You may need a Td booster every 10 years. Varicella (chickenpox) vaccine  You may need this if you have not been vaccinated. Zoster (shingles) vaccine  You may need this after age 13. Measles, mumps, and rubella (MMR) vaccine  You may need at least one dose of MMR if you were born in 1957 or later. You may also need a second dose. Pneumococcal conjugate (PCV13) vaccine  You may need this if you have certain conditions and were not previously vaccinated. Pneumococcal polysaccharide (PPSV23) vaccine  You may need one or two doses if you smoke cigarettes or if you have certain conditions. Meningococcal conjugate (MenACWY) vaccine  You may need this if you have certain conditions. Hepatitis A vaccine  You may need this if you have certain conditions or if you travel or work in places where you may be exposed to hepatitis A. Hepatitis B vaccine  You may need this if you have certain conditions or if you travel or work  in places where you may be exposed to hepatitis B. Haemophilus influenzae type b (Hib) vaccine  You may need this if you have certain conditions. Human papillomavirus (HPV) vaccine  If recommended by your health care provider, you may need three doses over 6 months. You may receive vaccines as individual doses or as more than one vaccine together in one shot (combination vaccines). Talk with your health care provider about the risks and benefits of combination vaccines. What tests do I need? Blood tests  Lipid and cholesterol levels. These may be checked every 5 years, or more frequently if you are over 7 years old.  Hepatitis C test.  Hepatitis B test. Screening  Lung cancer screening. You may have this screening every year starting at age 61 if you have a 30-pack-year history of smoking and currently smoke or have quit within the past 15 years.  Colorectal cancer screening. All adults should have this screening starting at age 68 and continuing until age 58. Your  health care provider may recommend screening at age 17 if you are at increased risk. You will have tests every 1-10 years, depending on your results and the type of screening test.  Diabetes screening. This is done by checking your blood sugar (glucose) after you have not eaten for a while (fasting). You may have this done every 1-3 years.  Mammogram. This may be done every 1-2 years. Talk with your health care provider about when you should start having regular mammograms. This may depend on whether you have a family history of breast cancer.  BRCA-related cancer screening. This may be done if you have a family history of breast, ovarian, tubal, or peritoneal cancers.  Pelvic exam and Pap test. This may be done every 3 years starting at age 68. Starting at age 1, this may be done every 5 years if you have a Pap test in combination with an HPV test. Other tests  Sexually transmitted disease (STD) testing.  Bone density  scan. This is done to screen for osteoporosis. You may have this scan if you are at high risk for osteoporosis. Follow these instructions at home: Eating and drinking  Eat a diet that includes fresh fruits and vegetables, whole grains, lean protein, and low-fat dairy.  Take vitamin and mineral supplements as recommended by your health care provider.  Do not drink alcohol if: ? Your health care provider tells you not to drink. ? You are pregnant, may be pregnant, or are planning to become pregnant.  If you drink alcohol: ? Limit how much you have to 0-1 drink a day. ? Be aware of how much alcohol is in your drink. In the [place] one drink equals one 12 oz bottle of beer (355 mL), one 5 oz glass of wine (148 mL), or one 1 oz glass of hard liquor (44 mL). Lifestyle  Take daily care of your teeth and gums.  Stay active. Exercise for at least 30 minutes on 5 or more days each week.  Do not use any products that contain nicotine or tobacco, such as cigarettes, e-cigarettes, and chewing tobacco. If you need help quitting, ask your health care provider.  If you are sexually active, practice safe sex. Use a condom or other form of birth control (contraception) in order to prevent pregnancy and STIs (sexually transmitted infections).  If told by your health care provider, take low-dose aspirin daily starting at age 35. What's next?  Visit your health care provider once a year for a well check visit.  Ask your health care provider how often you should have your eyes and teeth checked.  Stay up to date on all vaccines. This information is not intended to replace advice given to you by your health care provider. Make sure you discuss any questions you have with your health care provider. Document Revised: 11/28/2017 Document Reviewed: 11/28/2017 Elsevier Patient Education  2020 [employer] Arietta Eisenstein M.D.

## 2019-05-27 ENCOUNTER — Ambulatory Visit (INDEPENDENT_AMBULATORY_CARE_PROVIDER_SITE_OTHER): Payer: 59 | Admitting: Internal Medicine

## 2019-05-27 ENCOUNTER — Encounter: Payer: Self-pay | Admitting: Internal Medicine

## 2019-05-27 VITALS — BP 122/76 | HR 82 | Temp 98.0°F | Ht 65.0 in | Wt 205.6 lb

## 2019-05-27 DIAGNOSIS — D6859 Other primary thrombophilia: Secondary | ICD-10-CM | POA: Diagnosis not present

## 2019-05-27 DIAGNOSIS — R454 Irritability and anger: Secondary | ICD-10-CM

## 2019-05-27 DIAGNOSIS — Z86718 Personal history of other venous thrombosis and embolism: Secondary | ICD-10-CM

## 2019-05-27 DIAGNOSIS — E785 Hyperlipidemia, unspecified: Secondary | ICD-10-CM

## 2019-05-27 DIAGNOSIS — F4329 Adjustment disorder with other symptoms: Secondary | ICD-10-CM

## 2019-05-27 DIAGNOSIS — Z6379 Other stressful life events affecting family and household: Secondary | ICD-10-CM

## 2019-05-27 DIAGNOSIS — Z8632 Personal history of gestational diabetes: Secondary | ICD-10-CM | POA: Diagnosis not present

## 2019-05-27 DIAGNOSIS — Z0000 Encounter for general adult medical examination without abnormal findings: Secondary | ICD-10-CM

## 2019-05-27 MED ORDER — CITALOPRAM HYDROBROMIDE 10 MG PO TABS: 10.0000 mg | ORAL_TABLET | Freq: Every day | ORAL | 1 refills | Status: DC

## 2019-05-27 NOTE — Patient Instructions (Addendum)
Get appt for fasting lab  And will complete form .  Can try  Low dose citalopram every day  And plan Virtual visit med eval in about 3 weeks or as needed.   Let me know about derm or skin referral   When ready .     Preventive Care 41-45 Years Old, Female Preventive care refers to visits with your health care provider and lifestyle choices that can promote health and wellness. This includes:  A yearly physical exam. This may also be called an annual well check.  Regular dental visits and eye exams.  Immunizations.  Screening for certain conditions.  Healthy lifestyle choices, such as eating a healthy diet, getting regular exercise, not using drugs or products that contain nicotine and tobacco, and limiting alcohol use. What can I expect for my preventive care visit? Physical exam Your health care provider will check your:  Height and weight. This may be used to calculate body mass index (BMI), which tells if you are at a healthy weight.  Heart rate and blood pressure.  Skin for abnormal spots. Counseling Your health care provider may ask you questions about your:  Alcohol, tobacco, and drug use.  Emotional well-being.  Home and relationship well-being.  Sexual activity.  Eating habits.  Work and work Statistician.  Method of birth control.  Menstrual cycle.  Pregnancy history. What immunizations do I need?  Influenza (flu) vaccine  This is recommended every year. Tetanus, diphtheria, and pertussis (Tdap) vaccine  You may need a Td booster every 10 years. Varicella (chickenpox) vaccine  You may need this if you have not been vaccinated. Zoster (shingles) vaccine  You may need this after age 75. Measles, mumps, and rubella (MMR) vaccine  You may need at least one dose of MMR if you were born in 1957 or later. You may also need a second dose. Pneumococcal conjugate (PCV13) vaccine  You may need this if you have certain conditions and were not  previously vaccinated. Pneumococcal polysaccharide (PPSV23) vaccine  You may need one or two doses if you smoke cigarettes or if you have certain conditions. Meningococcal conjugate (MenACWY) vaccine  You may need this if you have certain conditions. Hepatitis A vaccine  You may need this if you have certain conditions or if you travel or work in places where you may be exposed to hepatitis A. Hepatitis B vaccine  You may need this if you have certain conditions or if you travel or work in places where you may be exposed to hepatitis B. Haemophilus influenzae type b (Hib) vaccine  You may need this if you have certain conditions. Human papillomavirus (HPV) vaccine  If recommended by your health care provider, you may need three doses over 6 months. You may receive vaccines as individual doses or as more than one vaccine together in one shot (combination vaccines). Talk with your health care provider about the risks and benefits of combination vaccines. What tests do I need? Blood tests  Lipid and cholesterol levels. These may be checked every 5 years, or more frequently if you are over 28 years old.  Hepatitis C test.  Hepatitis B test. Screening  Lung cancer screening. You may have this screening every year starting at age 43 if you have a 30-pack-year history of smoking and currently smoke or have quit within the past 15 years.  Colorectal cancer screening. All adults should have this screening starting at age 45 and continuing until age 25. Your health care provider may  recommend screening at age 80 if you are at increased risk. You will have tests every 1-10 years, depending on your results and the type of screening test.  Diabetes screening. This is done by checking your blood sugar (glucose) after you have not eaten for a while (fasting). You may have this done every 1-3 years.  Mammogram. This may be done every 1-2 years. Talk with your health care provider about when you  should start having regular mammograms. This may depend on whether you have a family history of breast cancer.  BRCA-related cancer screening. This may be done if you have a family history of breast, ovarian, tubal, or peritoneal cancers.  Pelvic exam and Pap test. This may be done every 3 years starting at age 35. Starting at age 20, this may be done every 5 years if you have a Pap test in combination with an HPV test. Other tests  Sexually transmitted disease (STD) testing.  Bone density scan. This is done to screen for osteoporosis. You may have this scan if you are at high risk for osteoporosis. Follow these instructions at home: Eating and drinking  Eat a diet that includes fresh fruits and vegetables, whole grains, lean protein, and low-fat dairy.  Take vitamin and mineral supplements as recommended by your health care provider.  Do not drink alcohol if: ? Your health care provider tells you not to drink. ? You are pregnant, may be pregnant, or are planning to become pregnant.  If you drink alcohol: ? Limit how much you have to 0-1 drink a day. ? Be aware of how much alcohol is in your drink. In the [place] one drink equals one 12 oz bottle of beer (355 mL), one 5 oz glass of wine (148 mL), or one 1 oz glass of hard liquor (44 mL). Lifestyle  Take daily care of your teeth and gums.  Stay active. Exercise for at least 30 minutes on 5 or more days each week.  Do not use any products that contain nicotine or tobacco, such as cigarettes, e-cigarettes, and chewing tobacco. If you need help quitting, ask your health care provider.  If you are sexually active, practice safe sex. Use a condom or other form of birth control (contraception) in order to prevent pregnancy and STIs (sexually transmitted infections).  If told by your health care provider, take low-dose aspirin daily starting at age 52. What's next?  Visit your health care provider once a year for a well check visit.   Ask your health care provider how often you should have your eyes and teeth checked.  Stay up to date on all vaccines. This information is not intended to replace advice given to you by your health care provider. Make sure you discuss any questions you have with your health care provider. Document Revised: 11/28/2017 Document Reviewed: 11/28/2017 Elsevier Patient Education  2020 [employer].

## 2019-06-01 ENCOUNTER — Other Ambulatory Visit: Payer: Self-pay

## 2019-06-02 ENCOUNTER — Other Ambulatory Visit: Payer: 59

## 2019-06-03 ENCOUNTER — Other Ambulatory Visit (INDEPENDENT_AMBULATORY_CARE_PROVIDER_SITE_OTHER): Payer: 59

## 2019-06-03 ENCOUNTER — Other Ambulatory Visit: Payer: Self-pay

## 2019-06-03 DIAGNOSIS — E785 Hyperlipidemia, unspecified: Secondary | ICD-10-CM

## 2019-06-03 DIAGNOSIS — D6859 Other primary thrombophilia: Secondary | ICD-10-CM | POA: Diagnosis not present

## 2019-06-03 DIAGNOSIS — Z86718 Personal history of other venous thrombosis and embolism: Secondary | ICD-10-CM

## 2019-06-03 DIAGNOSIS — Z0000 Encounter for general adult medical examination without abnormal findings: Secondary | ICD-10-CM

## 2019-06-03 DIAGNOSIS — Z8632 Personal history of gestational diabetes: Secondary | ICD-10-CM

## 2019-06-03 LAB — HEMOGLOBIN A1C: Hgb A1c MFr Bld: 5.8 % (ref 4.6–6.5)

## 2019-06-03 LAB — LIPID PANEL
Cholesterol: 280 mg/dL — ABNORMAL HIGH (ref 0–200)
HDL: 45.1 mg/dL (ref >39.00)
LDL Cholesterol: 209 mg/dL — ABNORMAL HIGH (ref 0–99)
NonHDL: 235.25
Total CHOL/HDL Ratio: 6
Triglycerides: 132 mg/dL (ref 0.0–149.0)
VLDL: 26.4 mg/dL (ref 0.0–40.0)

## 2019-06-03 LAB — CBC WITH DIFFERENTIAL/PLATELET
Basophils Absolute: 0 10*3/uL (ref 0.0–0.1)
Basophils Relative: 0.4 % (ref 0.0–3.0)
Eosinophils Absolute: 0.1 10*3/uL (ref 0.0–0.7)
Eosinophils Relative: 1.1 % (ref 0.0–5.0)
HCT: 42.1 % (ref 36.0–46.0)
Hemoglobin: 14.3 g/dL (ref 12.0–15.0)
Lymphocytes Relative: 33.4 % (ref 12.0–46.0)
Lymphs Abs: 2.3 10*3/uL (ref 0.7–4.0)
MCHC: 34.1 g/dL (ref 30.0–36.0)
MCV: 93.4 fl (ref 78.0–100.0)
Monocytes Absolute: 0.4 10*3/uL (ref 0.1–1.0)
Monocytes Relative: 5 % (ref 3.0–12.0)
Neutro Abs: 4.2 10*3/uL (ref 1.4–7.7)
Neutrophils Relative %: 60.1 % (ref 43.0–77.0)
Platelets: 231 10*3/uL (ref 150.0–400.0)
RBC: 4.51 Mil/uL (ref 3.87–5.11)
RDW: 12 % (ref 11.5–15.5)
WBC: 7 10*3/uL (ref 4.0–10.5)

## 2019-06-03 LAB — COMPREHENSIVE METABOLIC PANEL
ALT: 16 U/L (ref 0–35)
AST: 15 U/L (ref 0–37)
Albumin: 4.2 g/dL (ref 3.5–5.2)
Alkaline Phosphatase: 74 U/L (ref 39–117)
BUN: 10 mg/dL (ref 6–23)
CO2: 29 mEq/L (ref 19–32)
Calcium: 9.4 mg/dL (ref 8.4–10.5)
Chloride: 102 mEq/L (ref 96–112)
Creatinine, Ser: 0.83 mg/dL (ref 0.40–1.20)
GFR: 74.39 mL/min (ref >60.00)
Glucose, Bld: 105 mg/dL — ABNORMAL HIGH (ref 70–99)
Potassium: 4.1 mEq/L (ref 3.5–5.1)
Sodium: 138 mEq/L (ref 135–145)
Total Bilirubin: 1.3 mg/dL — ABNORMAL HIGH (ref 0.2–1.2)
Total Protein: 7.5 g/dL (ref 6.0–8.3)

## 2019-06-03 LAB — TSH: TSH: 0.88 u[IU]/mL (ref 0.35–4.50)

## 2019-06-03 NOTE — Progress Notes (Signed)
No diabetes    sugar slightly up   But cholesterol is very high  in the range where we usually start medication ( statin)  You can choose to Intensify lifestyle interventions.  Or in addition add medication either way check  lipid panel in 3 mos .  We can discuss at your follow up visit  also

## 2019-06-17 ENCOUNTER — Encounter: Payer: Self-pay | Admitting: Internal Medicine

## 2019-06-17 ENCOUNTER — Other Ambulatory Visit: Payer: Self-pay

## 2019-06-17 ENCOUNTER — Telehealth (INDEPENDENT_AMBULATORY_CARE_PROVIDER_SITE_OTHER): Payer: 59 | Admitting: Internal Medicine

## 2019-06-17 DIAGNOSIS — R454 Irritability and anger: Secondary | ICD-10-CM | POA: Diagnosis not present

## 2019-06-17 DIAGNOSIS — Z79899 Other long term (current) drug therapy: Secondary | ICD-10-CM | POA: Diagnosis not present

## 2019-06-17 DIAGNOSIS — E785 Hyperlipidemia, unspecified: Secondary | ICD-10-CM | POA: Diagnosis not present

## 2019-06-17 MED ORDER — ATORVASTATIN CALCIUM 20 MG PO TABS: 20.0000 mg | ORAL_TABLET | Freq: Every day | ORAL | 1 refills | Status: DC

## 2019-06-17 NOTE — Progress Notes (Signed)
Virtual Visit via Video Note  I connected with@ on 06/17/19 at 12:00 PM EDT by a video enabled telemedicine application and verified that I am speaking with the correct person using two identifiers. Location patient: home Location provider:work  office Persons participating in the virtual visit: patient, provider  [place] national recommendations  regarding COVID 19 pandemic   video visit is advised over in office visit for this patient.  Patient aware  of the limitations of evaluation and management by telemedicine and  availability of in person appointments. and agreed to proceed.   HPI: Autumn Cruz presents for video visit  Fu  meds and cholesterol elevation . Citalopram causes sleepiness getting better  Sleeping better at night   Seems calmer and thinks its helping  Not getting as ,angry irritated at times.   Lipids"  Was on crestor in past rash on left leg  Began at that time and never went away  See prev notes as to  derms evaluations    No allergic rx  Concern if med could do this again     ROS: See pertinent positives and negatives per HPI.  Past Medical History:  Diagnosis Date  . Arthritis   . DVT (deep venous thrombosis) ([place])   . Gestational diabetes   . High cholesterol   . Hx of thrombosis 2014   Left lower extrm.  Marland Kitchen Positive TB test   . Post-phlebitic syndrome 2014   Left  LE  . Thrombosis    Left leg preg tulip filter then removed.   . Tuberculosis    Positive test    Past Surgical History:  Procedure Laterality Date  . CESAREAN SECTION  2010 & 2012    Family History  Problem Relation Age of Onset  . Heart disease Mother        Blockage/Main artery  . Diabetes Mother   . Coronary artery disease Mother   . Hyperlipidemia Mother   . Hypertension Father   . Deep vein thrombosis Father   . Renal Disease Sister   . High Cholesterol Sister   . High Cholesterol Sister   . High Cholesterol Sister     Social History   Tobacco Use  . Smoking status:  Never Smoker  . Smokeless tobacco: Never Used  Substance Use Topics  . Alcohol use: No    Alcohol/week: 0.0 standard drinks  . Drug use: No      Current Outpatient Medications:  .  albuterol (PROVENTIL HFA;VENTOLIN HFA) 108 (90 Base) MCG/ACT inhaler, Inhale 2 puffs into the lungs every 6 (six) hours as needed for wheezing or shortness of breath., Disp: 1 Inhaler, Rfl: 0 .  aspirin 81 MG tablet, Take 81 mg by mouth as needed. , Disp: , Rfl:  .  citalopram (CELEXA) 10 MG tablet, Take 1 tablet (10 mg total) by mouth daily., Disp: 30 tablet, Rfl: 1 .  enoxaparin (LOVENOX) 40 MG/0.4ML injection, Inject 0.4 mLs (40 mg total) into the skin daily. (Patient taking differently: Inject 40 mg into the skin as needed. ), Disp: 20 Syringe, Rfl: 0 .  Multiple Vitamin (MULTI-VITAMIN PO), Take by mouth., Disp: , Rfl:  .  atorvastatin (LIPITOR) 20 MG tablet, Take 1 tablet (20 mg total) by mouth daily., Disp: 90 tablet, Rfl: 1  EXAM: BP Readings from Last 3 Encounters:  05/27/19 122/76  05/05/18 (!) 148/89  04/15/18 124/72    VITALS per patient if applicable:  GENERAL: alert, oriented, appears well and in no acute distress looks  well   HEENT: atraumatic, conjunttiva clear, no obvious abnormalities on inspection of external nose and ears  NECK: normal movements of the head and neck  LUNGS: on inspection no signs of respiratory distress, breathing rate appears normal, no obvious gross SOB, gasping or wheezing  CV: no obvious cyanosis  PSYCH/NEURO: pleasant and cooperative, no obvious depression or anxiety, speech and thought processing grossly intact Lab Results  Component Value Date   WBC 7.0 06/03/2019   HGB 14.3 06/03/2019   HCT 42.1 06/03/2019   PLT 231.0 06/03/2019   GLUCOSE 105 (H) 06/03/2019   CHOL 280 (H) 06/03/2019   TRIG 132.0 06/03/2019   HDL 45.10 06/03/2019   LDLDIRECT 200.7 02/04/2013   LDLCALC 209 (H) 06/03/2019   ALT 16 06/03/2019   AST 15 06/03/2019   NA 138 06/03/2019    K 4.1 06/03/2019   CL 102 06/03/2019   CREATININE 0.83 06/03/2019   BUN 10 06/03/2019   CO2 29 06/03/2019   TSH 0.88 06/03/2019   HGBA1C 5.8 06/03/2019    ASSESSMENT AND PLAN:  Discussed the following assessment and plan:    ICD-10-CM   1. Hyperlipidemia, unspecified hyperlipidemia type  E78.5 Lipid panel    Hepatic function panel  2. Medication management  Z79.899 Lipid panel    Hepatic function panel  3. Irritability  R45.4    improved on medication 10 citalopram see text    Seemingly positive effect of  Low dose citalopram  And can decide on time of day to  take med  Continue  consider in 15 if needed but seems well now  Plan ROV  med check in  About 3 mos    LIPIDS  Doubt sig se but will try atorva 20 at this time for ldl over 200  Plan fasting lipid panel in about 3 mos pre visit  virtual ok  Counseled.   Expectant management and discussion of plan and treatment with opportunity to ask questions and all were answered. The patient agreed with the plan and demonstrated an understanding of the instructions.   Advised to call back or seek an in-person evaluation if worsening  or having  further concerns . Return in about 3 months (around 09/17/2019) for fastbing lab pre visit   virtual ok .    Berniece Andreas, MD

## 2019-06-20 ENCOUNTER — Other Ambulatory Visit: Payer: Self-pay | Admitting: Internal Medicine

## 2019-09-10 ENCOUNTER — Other Ambulatory Visit: Payer: 59

## 2019-09-17 ENCOUNTER — Telehealth: Payer: 59 | Admitting: Internal Medicine

## 2019-09-23 ENCOUNTER — Other Ambulatory Visit: Payer: Self-pay

## 2019-09-24 ENCOUNTER — Other Ambulatory Visit (INDEPENDENT_AMBULATORY_CARE_PROVIDER_SITE_OTHER): Payer: 59

## 2019-09-24 ENCOUNTER — Other Ambulatory Visit: Payer: 59

## 2019-09-24 ENCOUNTER — Other Ambulatory Visit: Payer: Self-pay

## 2019-09-24 DIAGNOSIS — E785 Hyperlipidemia, unspecified: Secondary | ICD-10-CM

## 2019-09-24 DIAGNOSIS — Z79899 Other long term (current) drug therapy: Secondary | ICD-10-CM | POA: Diagnosis not present

## 2019-09-24 DIAGNOSIS — E559 Vitamin D deficiency, unspecified: Secondary | ICD-10-CM

## 2019-09-24 LAB — HEPATIC FUNCTION PANEL
ALT: 30 U/L (ref 0–35)
AST: 19 U/L (ref 0–37)
Albumin: 4.5 g/dL (ref 3.5–5.2)
Alkaline Phosphatase: 77 U/L (ref 39–117)
Bilirubin, Direct: 0.3 mg/dL (ref 0.0–0.3)
Total Bilirubin: 1.6 mg/dL — ABNORMAL HIGH (ref 0.2–1.2)
Total Protein: 7.7 g/dL (ref 6.0–8.3)

## 2019-09-24 LAB — LIPID PANEL
Cholesterol: 198 mg/dL (ref 0–200)
HDL: 47 mg/dL (ref >39.00)
LDL Cholesterol: 125 mg/dL — ABNORMAL HIGH (ref 0–99)
NonHDL: 150.51
Total CHOL/HDL Ratio: 4
Triglycerides: 128 mg/dL (ref 0.0–149.0)
VLDL: 25.6 mg/dL (ref 0.0–40.0)

## 2019-09-24 NOTE — Progress Notes (Signed)
Per PCP ok to order Vit-Millenia Waldvogel for future draw/thx dmf

## 2019-09-24 NOTE — Addendum Note (Signed)
Addended by: Lerry Liner on: 09/24/2019 08:38 AM   Modules accepted: Orders

## 2019-09-28 ENCOUNTER — Telehealth (INDEPENDENT_AMBULATORY_CARE_PROVIDER_SITE_OTHER): Payer: 59 | Admitting: Internal Medicine

## 2019-09-28 ENCOUNTER — Other Ambulatory Visit: Payer: Self-pay

## 2019-09-28 ENCOUNTER — Encounter: Payer: Self-pay | Admitting: Internal Medicine

## 2019-09-28 VITALS — Ht 65.0 in | Wt 205.0 lb

## 2019-09-28 DIAGNOSIS — R454 Irritability and anger: Secondary | ICD-10-CM

## 2019-09-28 DIAGNOSIS — E785 Hyperlipidemia, unspecified: Secondary | ICD-10-CM

## 2019-09-28 DIAGNOSIS — Z79899 Other long term (current) drug therapy: Secondary | ICD-10-CM

## 2019-09-28 NOTE — Progress Notes (Signed)
Virtual Visit via Video Note  I connected with@ on 09/28/19 at 11:00 AM EDT by a video enabled telemedicine application and verified that I am speaking with the correct person using two identifiers. Location patient: home Location provider:work  office Persons participating in the virtual visit: patient, provider  [place] national recommendations  regarding COVID 19 pandemic   video visit is advised over in office visit for this patient.  Patient aware  of the limitations of evaluation and management by telemedicine and  availability of in person appointments. and agreed to proceed.   HPI: Autumn Cruz presents for video visit fu meds   Citalopram:   Taking   A bit sleepy so taking  1/2 per day .   Mid day  10 mg still helpful for  Mood  And will continue .  HLD management  takine atorva  Missed about 4 days when went out of town but  Back on   Sis still have high cholesterol alos and normal weight  Had  covid vaccine    [place] still not letting in [place] passengers and father isnt doing well still   Asks about vit d but level wasn't done had been off meds will go back on    ROS: See pertinent positives and negatives per HPI.  Past Medical History:  Diagnosis Date  . Arthritis   . DVT (deep venous thrombosis) ([facility])   . Gestational diabetes   . High cholesterol   . Hx of thrombosis 2014   Left lower extrm.  Marland Kitchen Positive TB test   . Post-phlebitic syndrome 2014   Left  LE  . Thrombosis    Left leg preg tulip filter then removed.   . Tuberculosis    Positive test    Past Surgical History:  Procedure Laterality Date  . CESAREAN SECTION  2010 & 2012    Family History  Problem Relation Age of Onset  . Heart disease Mother        Blockage/Main artery  . Diabetes Mother   . Coronary artery disease Mother   . Hyperlipidemia Mother   . Hypertension Father   . Deep vein thrombosis Father   . Renal Disease Sister   . High Cholesterol Sister   . High Cholesterol Sister   . High  Cholesterol Sister     Social History   Tobacco Use  . Smoking status: Never Smoker  . Smokeless tobacco: Never Used  Vaping Use  . Vaping Use: Never used  Substance Use Topics  . Alcohol use: No    Alcohol/week: 0.0 standard drinks  . Drug use: No      Current Outpatient Medications:  .  aspirin 81 MG tablet, Take 81 mg by mouth as needed. , Disp: , Rfl:  .  atorvastatin (LIPITOR) 20 MG tablet, Take 1 tablet (20 mg total) by mouth daily., Disp: 90 tablet, Rfl: 1 .  citalopram (CELEXA) 10 MG tablet, TAKE 1 TABLET BY MOUTH EVERY DAY, Disp: 90 tablet, Rfl: 1 .  enoxaparin (LOVENOX) 40 MG/0.4ML injection, Inject 0.4 mLs (40 mg total) into the skin daily. (Patient taking differently: Inject 40 mg into the skin as needed. ), Disp: 20 Syringe, Rfl: 0 .  Multiple Vitamin (MULTI-VITAMIN PO), Take by mouth., Disp: , Rfl:  .  albuterol (PROVENTIL HFA;VENTOLIN HFA) 108 (90 Base) MCG/ACT inhaler, Inhale 2 puffs into the lungs every 6 (six) hours as needed for wheezing or shortness of breath. (Patient not taking: Reported on 09/28/2019), Disp: 1 Inhaler,  Rfl: 0  EXAM: BP Readings from Last 3 Encounters:  05/27/19 122/76  05/05/18 (!) 148/89  04/15/18 124/72    VITALS per patient if applicable: GENERAL: alert, oriented, appears well and in no acute distress HEENT: atraumatic, conjunttiva clear, no obvious abnormalities on inspection of external nose and ears  NECK: normal movements of the head and neck LUNGS: on inspection no signs of respiratory distress, breathing rate appears normal, no obvious gross SOB, gasping or wheezing CV: no obvious cyanosis  PSYCH/NEURO: pleasant and cooperative, no obvious depression or anxiety, speech and thought processing grossly intact Lab Results  Component Value Date   WBC 7.0 06/03/2019   HGB 14.3 06/03/2019   HCT 42.1 06/03/2019   PLT 231.0 06/03/2019   GLUCOSE 105 (H) 06/03/2019   CHOL 198 09/24/2019   TRIG 128.0 09/24/2019   HDL 47.00  09/24/2019   LDLDIRECT 200.7 02/04/2013   LDLCALC 125 (H) 09/24/2019   ALT 30 09/24/2019   AST 19 09/24/2019   NA 138 06/03/2019   K 4.1 06/03/2019   CL 102 06/03/2019   CREATININE 0.83 06/03/2019   BUN 10 06/03/2019   CO2 29 06/03/2019   TSH 0.88 06/03/2019   HGBA1C 5.8 06/03/2019    ASSESSMENT AND PLAN:  Discussed the following assessment and plan:    ICD-10-CM   1. Hyperlipidemia, unspecified hyperlipidemia type  E78.5    much improve on lsi and medication atorva 20   2. Medication management  Z79.899   3. Irritability  R45.4    improvement on 10 citalopram   ldld  2089 down to 125  TC  280 to 198  hdl 47  Started  Again  vit d  Can ask to check at next blood tests but  Order didn't get completed this ti me  Stay on same meds  Plan  4 months and   Med check  Counseled.   Expectant management and discussion of plan and treatment with opportunity to ask questions and all were answered. The patient agreed with the plan and demonstrated an understanding of the instructions.  last cpx in march   Advised to call back or seek an in-person evaluation if worsening  or having  further concerns . Return in about 4 months (around 01/28/2020) for med check .    Shanon Ace, MD

## 2019-09-28 NOTE — Progress Notes (Signed)
Discussed at OV today  better

## 2019-11-24 ENCOUNTER — Telehealth: Payer: Self-pay | Admitting: Internal Medicine

## 2019-11-24 NOTE — Telephone Encounter (Signed)
Error

## 2020-04-19 NOTE — Progress Notes (Signed)
Office Visit Note  Patient: Autumn Cruz             Date of Birth: Feb 15, 1976           MRN: [id number]             PCP: Burnis Medin, MD Referring: Burnis Medin, MD Visit Date: 05/02/2020 Occupation: '@GUAROCC' @  Subjective:  Rash on left leg   History of Present Illness: Arlette Reach is a 45 y.o. female with history of osteoarthritis and DDD.  She has not been experiencing any increased joint pain or joint swelling recently. She denies any neck pain or stiffness.  The rash on her left lower extremity is unchanged.  She denies any facial rashes.  She has not had any increased photosensitivity or hair loss.  She denies any symptoms of Raynaud's.  She denies any oral or nasal ulcerations.  She has not had any sicca symptoms. She denies any swollen lymph nodes. She denies any SOB, chest pain, or palpations.  She denies any recurrence of DVT but continues to take aspirin 81 mg daily and lovenox as prescribed.  She has not had any recent infections.    Activities of Daily Living:  Patient reports morning stiffness for 0 minutes.   Patient Denies nocturnal pain.  Difficulty dressing/grooming: Denies Difficulty climbing stairs: Denies Difficulty getting out of chair: Denies Difficulty using hands for taps, buttons, cutlery, and/or writing: Denies  Review of Systems  Constitutional: Negative for fatigue.  HENT: Negative for mouth sores, mouth dryness and nose dryness.   Eyes: Positive for visual disturbance. Negative for pain and dryness.  Respiratory: Negative for cough, hemoptysis, shortness of breath and difficulty breathing.   Cardiovascular: Negative for chest pain, palpitations, hypertension and swelling in legs/feet.  Gastrointestinal: Negative for blood in stool, constipation and diarrhea.  Endocrine: Negative for increased urination.  Genitourinary: Negative for painful urination.  Musculoskeletal: Positive for arthralgias, joint pain, myalgias, muscle tenderness and myalgias.  Negative for joint swelling, muscle weakness and morning stiffness.  Skin: Positive for rash. Negative for color change, pallor, hair loss, nodules/bumps, skin tightness, ulcers and sensitivity to sunlight.  Allergic/Immunologic: Negative for susceptible to infections.  Neurological: Negative for dizziness, numbness, headaches and weakness.  Hematological: Negative for swollen glands.  Psychiatric/Behavioral: Positive for sleep disturbance. Negative for depressed mood. The patient is not nervous/anxious.     PMFS History:  Patient Active Problem List   Diagnosis Date Noted  . Fibromatosis, plantar 07/10/2017  . DDD (degenerative disc disease), cervical 05/22/2017  . Primary osteoarthritis of both knees 05/22/2017  . Polyarthralgia 02/28/2017  . Degenerative tear of medial meniscus, right 04/09/2016  . Degenerative arthritis of knee, bilateral 01/31/2016  . Skin lesion of left leg 05/30/2014  . Hx gestational diabetes 02/04/2013  . Visit for preventive health examination 02/04/2013  . Long term (current) use of anticoagulants 09/29/2012  . Family history of premature CAD 09/24/2012  . Hyperlipidemia 09/24/2012  . Hx of thrombosis of lower extremity in pregnancy 09/24/2012  . Hypercoagulable state ([place]) 09/24/2012  . Factor V Leiden mutation ([place]) 09/24/2012  . Elevated C-reactive protein (CRP) 09/24/2012  . Post-phlebitic syndrome 09/24/2012    Past Medical History:  Diagnosis Date  . Arthritis   . DVT (deep venous thrombosis) ([place])   . Gestational diabetes   . High cholesterol   . Hx of thrombosis 2014   Left lower extrm.  Marland Kitchen Positive TB test   . Post-phlebitic syndrome 2014   Left  LE  .  Thrombosis    Left leg preg tulip filter then removed.   . Tuberculosis    Positive test    Family History  Problem Relation Age of Onset  . Heart disease Mother        Blockage/Main artery  . Diabetes Mother   . Coronary artery disease Mother   . Hyperlipidemia Mother   .  Hypertension Father   . Deep vein thrombosis Father   . Renal Disease Sister   . High Cholesterol Sister   . High Cholesterol Sister   . High Cholesterol Sister    Past Surgical History:  Procedure Laterality Date  . CESAREAN SECTION  2010 & 2012   Social History   Social History Narrative   Usually gets 6-7 hours of sleep per night   4 people living in the home.   Originally from [place] has a bachelor's degree some training in nursing and alternative medicine.   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   Negative tobacco alcohol some caffeine   Gravida 2 para 2 last Pap 2013   Think she had T. 2012   Immunization History  Administered Date(s) Administered  . Influenza,inj,Quad PF,6+ Mos 02/04/2013, 05/28/2014, 12/27/2015, 11/25/2017  . Influenza-Unspecified 02/11/2015  . PFIZER(Purple Top)SARS-COV-2 Vaccination 06/17/2019, 07/08/2019, 02/16/2020  . Tdap 04/03/2010     Objective: Vital Signs: BP 116/84 (BP Location: Right Arm, Patient Position: Sitting, Cuff Size: Large)   Pulse 81   Resp 16   Ht 5' 5.5" (1.664 m)   Wt 210 lb 12.8 oz (95.6 kg)   BMI 34.55 kg/m    Physical Exam Vitals and nursing note reviewed.  Constitutional:      Appearance: She is well-developed and well-nourished.  HENT:     Head: Normocephalic and atraumatic.  Eyes:     Extraocular Movements: EOM normal.     Conjunctiva/sclera: Conjunctivae normal.  Cardiovascular:     Pulses: Intact distal pulses.  Pulmonary:     Effort: Pulmonary effort is normal.  Abdominal:     Palpations: Abdomen is soft.  Musculoskeletal:     Cervical back: Normal range of motion.  Skin:    General: Skin is warm and dry.     Capillary Refill: Capillary refill takes less than 2 seconds.  Neurological:     Mental Status: She is alert and oriented to person, place, and time.  Psychiatric:        Mood and  Affect: Mood and affect normal.        Behavior: Behavior normal.      Musculoskeletal Exam: C-spine, thoracic spine, and lumbar spine good ROM.  No midline spinal tenderness.  No SI joint tenderness.  Shoulder joints, elbow joints, wrist joints, MCPs, PIPs, and DIPs good ROM with no synovitis.  Complete fist formation bilaterally.  Hip joints good ROM with no discomfort.  Knee joints good ROM with crepitus bilaterally.  Ankle joints good ROM with no tenderness or swelling.  Pedal edema noted bilaterally.   CDAI Exam: CDAI Score: -- Patient Global: --; Provider Global: -- Swollen: --; Tender: -- Joint Exam 05/02/2020   No joint exam has been documented for this visit   There is currently no information documented on the homunculus. Go to the Rheumatology activity and complete the homunculus joint exam.  Investigation: No additional findings.  Imaging: No results found.  Recent Labs: Lab Results  Component Value Date   WBC 7.0 06/03/2019   HGB 14.3 06/03/2019   PLT 231.0 06/03/2019   NA 138 06/03/2019   K 4.1 06/03/2019   CL 102 06/03/2019   CO2 29 06/03/2019   GLUCOSE 105 (H) 06/03/2019   BUN 10 06/03/2019   CREATININE 0.83 06/03/2019   BILITOT 1.6 (H) 09/24/2019   ALKPHOS 77 09/24/2019   AST 19 09/24/2019   ALT 30 09/24/2019   PROT 7.7 09/24/2019   ALBUMIN 4.5 09/24/2019   CALCIUM 9.4 06/03/2019    Speciality Comments: No specialty comments available.  Procedures:  No procedures performed Allergies: Patient has no known allergies.   Assessment / Plan:     Visit Diagnoses: Primary osteoarthritis of both knees - Bilateral moderate osteoarthritis with moderate chondromalacia patella. H/o visco supplementation and PRP injection performed by Dr. Hulan Saas: She has good range of motion of both knee joints on exam.  No warmth or effusion was noted.  She has bilateral knee crepitus.  We discussed the importance of lower extremity muscle strengthening and regular  exercise.  She has been using a rowing machine for exercise recently.  Her level of fatigue has been stable.  She is advised to notify us if she develops increased joint pain or joint swelling.  She will follow up in 1 year.   Elevated sed rate - ESR was 28 on 02/28/17.  She has no synovitis on exam.  She has occasional arthralgias and myalgias which have been manageable overall.  She would like to recheck some autoimmune lab work today.  Sed rate and CRP will also be checked.  Plan: Sedimentation rate, C-reactive protein, 14-3-3 eta Protein, Cyclic citrul peptide antibody, IgG, Rheumatoid factor, ANA, C3 and C4, Anti-DNA antibody, double-stranded  Myalgia - She experiences intermittent myalgias and muscle tenderness.  She has not been experiencing any muscle weakness.  She had no difficulty raising her arms above her head or rising from a seated position.  She would like to recheck sed rate, CRP, CK and ANA today.  Plan: Sedimentation rate, C-reactive protein, ANA, CK  DDD (degenerative disc disease), cervical - Moderate spinal stenosis and foraminal stenosis C6-C7 on MRI 2017: She has good ROM of the C-spine with no discomfort.  No symptoms of radiculopathy.   Other medical conditions are listed as follows:   Plantar fasciitis - left. She had ultrasound performed by Dr. Alroy Dust in the past  revealed plantar fascial fibromatosis.  She wears orthotics and has underwent PT.  Ischial bursitis of right side: Resolved   Rash - History of rash on left lower extremity for the last 3 years. Unchanged   She has not had any new or worsening symptoms. The skin biopsy report: Lichenoid dermatitis.   No facial rashes.   History of positive PPD - Chest x-ray did not reveal any active cardiopulmonary disease on 04/15/18.   Factor V Leiden mutation [facility]) - She requested to have D-dimer checked today.  She remains on Aspirin and lovenox. Plan: D-dimer, quantitative (not at [facility])  Mixed hyperlipidemia  Hx of  thrombosis of lower extremity in pregnancy - Autoimmune workup was negative  Patient requested to have D-dimer checked today. She remains on aspirin 81 mg daily and lovenox.   She was previously seeing Dr. Beryle Beams. - Plan: D-dimer, quantitative (not at [facility])   Orders: Orders Placed This Encounter  Procedures  . Sedimentation rate  . C-reactive protein  . 14-3-3 eta Protein  . Cyclic citrul peptide antibody, IgG  .  Rheumatoid factor  . ANA  . C3 and C4  . Anti-DNA antibody, double-stranded  . CK  . D-dimer, quantitative (not at [facility])   No orders of the defined types were placed in this encounter.    Follow-Up Instructions: Return in about 1 year (around 05/02/2021) for Osteoarthritis, DDD.   Ofilia Neas, PA-C  Note - This record has been created using Dragon software.  Chart creation errors have been sought, but may not always  have been located. Such creation errors do not reflect on  the standard of medical care.

## 2020-05-02 ENCOUNTER — Encounter: Payer: Self-pay | Admitting: Physician Assistant

## 2020-05-02 ENCOUNTER — Other Ambulatory Visit: Payer: Self-pay

## 2020-05-02 ENCOUNTER — Ambulatory Visit (INDEPENDENT_AMBULATORY_CARE_PROVIDER_SITE_OTHER): Payer: 59 | Admitting: Physician Assistant

## 2020-05-02 VITALS — BP 116/84 | HR 81 | Resp 16 | Ht 65.5 in | Wt 210.8 lb

## 2020-05-02 DIAGNOSIS — M5030 Other cervical disc degeneration, unspecified cervical region: Secondary | ICD-10-CM | POA: Diagnosis not present

## 2020-05-02 DIAGNOSIS — M170 Bilateral primary osteoarthritis of knee: Secondary | ICD-10-CM | POA: Diagnosis not present

## 2020-05-02 DIAGNOSIS — E782 Mixed hyperlipidemia: Secondary | ICD-10-CM

## 2020-05-02 DIAGNOSIS — M722 Plantar fascial fibromatosis: Secondary | ICD-10-CM | POA: Diagnosis not present

## 2020-05-02 DIAGNOSIS — Z86718 Personal history of other venous thrombosis and embolism: Secondary | ICD-10-CM

## 2020-05-02 DIAGNOSIS — R700 Elevated erythrocyte sedimentation rate: Secondary | ICD-10-CM

## 2020-05-02 DIAGNOSIS — M7071 Other bursitis of hip, right hip: Secondary | ICD-10-CM

## 2020-05-02 DIAGNOSIS — M791 Myalgia, unspecified site: Secondary | ICD-10-CM

## 2020-05-02 DIAGNOSIS — R21 Rash and other nonspecific skin eruption: Secondary | ICD-10-CM

## 2020-05-02 DIAGNOSIS — Z9289 Personal history of other medical treatment: Secondary | ICD-10-CM

## 2020-05-02 DIAGNOSIS — D6851 Activated protein C resistance: Secondary | ICD-10-CM

## 2020-05-04 NOTE — Progress Notes (Signed)
CRP and ESR remain elevated.   She had no synovitis on exam at her recent appointment and she has not been experiencing any increased joint pain.  Anti-CCP and RF are negative. ANA is negative. Complements WNL and dsDNA is negative.  CK WNL.    D-dimer was WNL-Pt requested to have D Dimer rechecked and forwarded to her hematologist.  Please forward ESR and CRP as well.   Please also forward lab work to her PCP.

## 2020-05-10 LAB — CYCLIC CITRUL PEPTIDE ANTIBODY, IGG: Cyclic Citrullin Peptide Ab: <16 UNITS

## 2020-05-10 LAB — RHEUMATOID FACTOR: Rheumatoid fact SerPl-aCnc: <14 IU/mL (ref <14)

## 2020-05-10 LAB — SEDIMENTATION RATE: Sed Rate: 28 mm/h — ABNORMAL HIGH (ref 0–20)

## 2020-05-10 LAB — C3 AND C4
C3 Complement: 157 mg/dL (ref 83–193)
C4 Complement: 44 mg/dL (ref 15–57)

## 2020-05-10 LAB — C-REACTIVE PROTEIN: CRP: 13.6 mg/L — ABNORMAL HIGH (ref <8.0)

## 2020-05-10 LAB — ANA: Anti Nuclear Antibody (ANA): NEGATIVE

## 2020-05-10 LAB — D-DIMER, QUANTITATIVE: D-Dimer, Quant: 0.41 mcg/mL FEU (ref <0.50)

## 2020-05-10 LAB — CK: Total CK: 62 U/L (ref 29–143)

## 2020-05-10 LAB — ANTI-DNA ANTIBODY, DOUBLE-STRANDED: ds DNA Ab: 1 IU/mL

## 2020-05-10 LAB — 14-3-3 ETA PROTEIN: 14-3-3 eta Protein: <0.2 ng/mL (ref <0.2)

## 2020-05-10 NOTE — Progress Notes (Signed)
14-3-3 eta negative.

## 2020-07-24 NOTE — Progress Notes (Signed)
Chief Complaint  Patient presents with  . Annual Exam    HPI: Patient  Autumn Cruz  45 y.o. comes in today for Preventive Health Care visit  Stopped taking lipid medication  Not sure why ran out  atorva 20 mg  To travel [place]  In May for time to see  Family Father passed away in 30-Oct-2019 She is coping and ok adapting  Fasting for ramadan  So far  Says memory is off  Remembering  Names of people in pix of past  Is there way to check  "IQ"  Testing still ok  Neg fam hx cancer  Periods reg last one very light but ok  Has gyne  Health Maintenance  Topic Date Due  . Hepatitis C Screening  Never done  . HIV Screening  Never done  . COLONOSCOPY (Pts 45-77yrs Insurance coverage will need to be confirmed)  Never done  . PAP SMEAR-Modifier  08/30/2019  . TETANUS/TDAP  04/03/2020  . INFLUENZA VACCINE  10/31/2020  . COVID-19 Vaccine  Completed  . HPV VACCINES  Aged Out   Health Maintenance Review LIFESTYLE:  Exercise:   Gardening walking  Less in ramadan.  Tobacco/ETS:n Alcohol: n Sugar beverages: ocass  Sleep: 6-7  Cant sleep longer than that.  Drug use: no HH of   4   2 birds   Father  Passed in Oct 30, 2019 ROS:  GEN/ HEENT: No fever, significant weight changes sweats headaches vision problems hearing changes, CV/ PULM; No chest pain shortness of breath cough, syncope,edema  change in exercise tolerance. GI /GU: No adominal pain, vomiting, change in bowel habits. No blood in the stool. No significant GU symptoms. SKIN/HEME: ,no acute skin rashes suspicious lesions or bleeding. No lymphadenopathy, nodules, masses.  NEURO/ PSYCH:  No neurologic signs such as weakness numbness. No depression anxiety. IMM/ Allergy: No unusual infections.  Allergy .   REST of 12 system review negative except as per HPI   Past Medical History:  Diagnosis Date  . Arthritis   . DVT (deep venous thrombosis) (HCC)   . Gestational diabetes   . High cholesterol   . Hx of thrombosis  2014   Left lower extrm.  Marland Kitchen Positive TB test   . Post-phlebitic syndrome 2014   Left  LE  . Thrombosis    Left leg preg tulip filter then removed.   . Tuberculosis    Positive test    Past Surgical History:  Procedure Laterality Date  . CESAREAN SECTION  2010 & 2012    Family History  Problem Relation Age of Onset  . Heart disease Mother        Blockage/Main artery  . Diabetes Mother   . Coronary artery disease Mother   . Hyperlipidemia Mother   . Hypertension Father   . Deep vein thrombosis Father   . Renal Disease Sister   . High Cholesterol Sister   . High Cholesterol Sister   . High Cholesterol Sister     Social History   Socioeconomic History  . Marital status: Married    Spouse name: [name]  . Number of children: 2  . Years of education: Not on file  . Highest education level: Not on file  Occupational History  . Occupation: Homemaker  Tobacco Use  . Smoking status: Never Smoker  . Smokeless tobacco: Never Used  Vaping Use  . Vaping Use: Never used  Substance and Sexual Activity  . Alcohol  use: No    Alcohol/week: 0.0 standard drinks  . Drug use: No  . Sexual activity: Not on file  Other Topics Concern  . Not on file  Social History Narrative   Usually gets 6-7 hours of sleep per night   4 people living in the home.   Originally from [place] has a bachelor's degree some training in nursing and alternative medicine.   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   Negative tobacco alcohol some caffeine   Gravida 2 para 2 last Pap 2013   Think she had T. 2012   Social Determinants of Health   Financial Resource Strain: Not on file  Food Insecurity: Not on file  Transportation Needs: Not on file  Physical Activity: Not on file  Stress: Not on file  Social Connections: Not on file    Outpatient Medications Prior to Visit  Medication  Sig Dispense Refill  . aspirin 81 MG tablet Take 81 mg by mouth as needed.    . enoxaparin (LOVENOX) 40 MG/0.4ML injection Inject 0.4 mLs (40 mg total) into the skin daily. (Patient taking differently: Inject 40 mg into the skin as needed.) 20 Syringe 0   No facility-administered medications prior to visit.     EXAM:  BP 122/76 (BP Location: Left Arm, Patient Position: Sitting, Cuff Size: Large)   Pulse 66   Temp 98.2 F (36.8 C) (Oral)   Ht 5' 5.5" (1.664 m)   Wt 210 lb 12.8 oz (95.6 kg)   LMP 07/16/2020 (Exact Date)   SpO2 95%   BMI 34.55 kg/m   Body mass index is 34.55 kg/m. Wt Readings from Last 3 Encounters:  07/25/20 210 lb 12.8 oz (95.6 kg)  05/02/20 210 lb 12.8 oz (95.6 kg)  09/28/19 205 lb (93 kg)    Physical Exam: Vital signs reviewed ZOX:WRUEGEN:This is a well-developed well-nourished alert cooperative    who appearsr stated age in no acute distress.  HEENT: normocephalic atraumatic , Eyes: PERRL EOM's full, conjunctiva clear, Nares: paten,t no deformity discharge or tenderness., Ears: no deformity EAC's clear TMs with normal landmarks. Mouth masked NECK: supple without masses, thyromegaly or bruits. CHEST/PULM:  Clear to auscultation and percussion breath sounds equal no wheeze , rales or rhonchi. Breast: normal by inspection . No dimpling, discharge, masses, tenderness or discharge . CV: PMI is nondisplaced, S1 S2 no gallops, murmurs, rubs. Peripheral pulses are full without delay.No JVD .  ABDOMEN: Bowel sounds normal nontender  No guard or rebound, no hepato splenomegal no CVA tenderness. Extremtities:  No clubbing cyanosis or edema, no acute joint swelling or redness no focal atrophy NEURO:  Oriented x3, cranial nerves 3-12 appear to be intact, no obvious focal weakness,gait within normal limits no abnormal reflexes or asymmetrical SKIN: No acute rashes normal turgor, color, no bruising or petechiae. PSYCH: Oriented, good eye contact, no obvious depression anxiety,  cognition and judgment appear normal. LN: no cervical axillary adenopathy  Lab Results  Component Value Date   WBC 7.0 06/03/2019   HGB 14.3 06/03/2019   HCT 42.1 06/03/2019   PLT 231.0 06/03/2019   GLUCOSE 105 (H) 06/03/2019   CHOL 198 09/24/2019   TRIG 128.0 09/24/2019   HDL 47.00 09/24/2019   LDLDIRECT 200.7 02/04/2013   LDLCALC 125 (H) 09/24/2019   ALT 30 09/24/2019   AST 19 09/24/2019   NA 138 06/03/2019  K 4.1 06/03/2019   CL 102 06/03/2019   CREATININE 0.83 06/03/2019   BUN 10 06/03/2019   CO2 29 06/03/2019   TSH 0.88 06/03/2019   HGBA1C 5.8 06/03/2019    BP Readings from Last 3 Encounters:  07/25/20 122/76  05/02/20 116/84  05/27/19 122/76    Lab plan  reviewed with patient   ASSESSMENT AND PLAN:  Discussed the following assessment and plan:    ICD-10-CM   1. Visit for preventive health examination  Z00.00 Basic metabolic panel    CBC with Differential/Platelet    Hepatic function panel    Hemoglobin A1c    Lipid panel    TSH    T4, free    Vitamin B12  2. Medication management  Z79.899 Basic metabolic panel    CBC with Differential/Platelet    Hepatic function panel    Hemoglobin A1c    Lipid panel    TSH    T4, free    Vitamin B12  3. Complaints of memory disturbance  R41.3 Basic metabolic panel    CBC with Differential/Platelet    Hepatic function panel    Hemoglobin A1c    Lipid panel    TSH    T4, free    Vitamin B12  4. Hypercoagulable state (HCC)  D68.59 Basic metabolic panel    CBC with Differential/Platelet    Hepatic function panel    Hemoglobin A1c    Lipid panel    TSH    T4, free    Vitamin B12  5. Hx gestational diabetes  Z86.32   6. Hx of thrombosis of lower extremity in pregnancy  Z86.718   7. Hyperlipidemia, unspecified hyperlipidemia type  E78.5 Basic metabolic panel    CBC with Differential/Platelet    Hepatic function panel    Hemoglobin A1c    Lipid panel    TSH    T4, free    Vitamin B12  8. Colon cancer  screening  Z12.11 Ambulatory referral to Gastroenterology  make  Fasting  Lab appt as possible  consider going back on  Statin  Disc  How bereavement   Menopause  Effect memory  Return in about 1 year (around 07/25/2021) for fasting labs soon   cpx in a year or as indicated .  Patient Care Team: Jecenia Leamer, Neta Mends, MD as PCP - General (Internal Medicine) Levert Feinstein, MD as Consulting Physician (Oncology) Sherren Kerns, MD as Consulting Physician (Vascular Surgery) Carrington Clamp, MD as Consulting Physician (Obstetrics and Gynecology) Pollyann Savoy, MD as Consulting Physician (Rheumatology) Patient Instructions   Try the SAGE self test on line.  Colonoscopy referral. For  colon screening ,   Get appt for fasting labs .  Will refill  Lovenox.  Cholesterol med after lipid results are back .     Health Maintenance, Female Adopting a healthy lifestyle and getting preventive care are important in promoting health and wellness. Ask your health care provider about:  The right schedule for you to have regular tests and exams.  Things you can do on your own to prevent diseases and keep yourself healthy. What should I know about diet, weight, and exercise? Eat a healthy diet  Eat a diet that includes plenty of vegetables, fruits, low-fat dairy products, and lean protein.  Do not eat a lot of foods that are high in solid fats, added sugars, or sodium.   Maintain a healthy weight Body mass index (BMI) is used to identify weight problems. It estimates  body fat based on height and weight. Your health care provider can help determine your BMI and help you achieve or maintain a healthy weight. Get regular exercise Get regular exercise. This is one of the most important things you can do for your health. Most adults should:  Exercise for at least 150 minutes each week. The exercise should increase your heart rate and make you sweat (moderate-intensity exercise).  Do  strengthening exercises at least twice a week. This is in addition to the moderate-intensity exercise.  Spend less time sitting. Even light physical activity can be beneficial. Watch cholesterol and blood lipids Have your blood tested for lipids and cholesterol at 46 years of age, then have this test every 5 years. Have your cholesterol levels checked more often if:  Your lipid or cholesterol levels are high.  You are older than 46 years of age.  You are at high risk for heart disease. What should I know about cancer screening? Depending on your health history and family history, you may need to have cancer screening at various ages. This may include screening for:  Breast cancer.  Cervical cancer.  Colorectal cancer.  Skin cancer.  Lung cancer. What should I know about heart disease, diabetes, and high blood pressure? Blood pressure and heart disease  High blood pressure causes heart disease and increases the risk of stroke. This is more likely to develop in people who have high blood pressure readings, are of African descent, or are overweight.  Have your blood pressure checked: ? Every 3-5 years if you are 65-49 years of age. ? Every year if you are 81 years old or older. Diabetes Have regular diabetes screenings. This checks your fasting blood sugar level. Have the screening done:  Once every three years after age 79 if you are at a normal weight and have a low risk for diabetes.  More often and at a younger age if you are overweight or have a high risk for diabetes. What should I know about preventing infection? Hepatitis B If you have a higher risk for hepatitis B, you should be screened for this virus. Talk with your health care provider to find out if you are at risk for hepatitis B infection. Hepatitis C Testing is recommended for:  Everyone born from [birth year] through 1966.  Anyone with known risk factors for hepatitis C. Sexually transmitted infections  (STIs)  Get screened for STIs, including gonorrhea and chlamydia, if: ? You are sexually active and are younger than 46 years of age. ? You are older than 46 years of age and your health care provider tells you that you are at risk for this type of infection. ? Your sexual activity has changed since you were last screened, and you are at increased risk for chlamydia or gonorrhea. Ask your health care provider if you are at risk.  Ask your health care provider about whether you are at high risk for HIV. Your health care provider may recommend a prescription medicine to help prevent HIV infection. If you choose to take medicine to prevent HIV, you should first get tested for HIV. You should then be tested every 3 months for as long as you are taking the medicine. Pregnancy  If you are about to stop having your period (premenopausal) and you may become pregnant, seek counseling before you get pregnant.  Take 400 to 800 micrograms (mcg) of folic acid every day if you become pregnant.  Ask for birth control (contraception) if you want  to prevent pregnancy. Osteoporosis and menopause Osteoporosis is a disease in which the bones lose minerals and strength with aging. This can result in bone fractures. If you are 37 years old or older, or if you are at risk for osteoporosis and fractures, ask your health care provider if you should:  Be screened for bone loss.  Take a calcium or vitamin D supplement to lower your risk of fractures.  Be given hormone replacement therapy (HRT) to treat symptoms of menopause. Follow these instructions at home: Lifestyle  Do not use any products that contain nicotine or tobacco, such as cigarettes, e-cigarettes, and chewing tobacco. If you need help quitting, ask your health care provider.  Do not use street drugs.  Do not share needles.  Ask your health care provider for help if you need support or information about quitting drugs. Alcohol use  Do not drink  alcohol if: ? Your health care provider tells you not to drink. ? You are pregnant, may be pregnant, or are planning to become pregnant.  If you drink alcohol: ? Limit how much you use to 0-1 drink a day. ? Limit intake if you are breastfeeding.  Be aware of how much alcohol is in your drink. In the [place] one drink equals one 12 oz bottle of beer (355 mL), one 5 oz glass of wine (148 mL), or one 1 oz glass of hard liquor (44 mL). General instructions  Schedule regular health, dental, and eye exams.  Stay current with your vaccines.  Tell your health care provider if: ? You often feel depressed. ? You have ever been abused or do not feel safe at home. Summary  Adopting a healthy lifestyle and getting preventive care are important in promoting health and wellness.  Follow your health care provider's instructions about healthy diet, exercising, and getting tested or screened for diseases.  Follow your health care provider's instructions on monitoring your cholesterol and blood pressure. This information is not intended to replace advice given to you by your health care provider. Make sure you discuss any questions you have with your health care provider. Document Revised: 03/12/2018 Document Reviewed: 03/12/2018 Elsevier Patient Education  2021 [employer].      [place]. Rosaleigh Brazzel M.D.

## 2020-07-25 ENCOUNTER — Encounter: Payer: Self-pay | Admitting: Internal Medicine

## 2020-07-25 ENCOUNTER — Other Ambulatory Visit: Payer: Self-pay

## 2020-07-25 ENCOUNTER — Ambulatory Visit (INDEPENDENT_AMBULATORY_CARE_PROVIDER_SITE_OTHER): Payer: 59 | Admitting: Internal Medicine

## 2020-07-25 VITALS — BP 122/76 | HR 66 | Temp 98.2°F | Ht 65.5 in | Wt 210.8 lb

## 2020-07-25 DIAGNOSIS — Z8632 Personal history of gestational diabetes: Secondary | ICD-10-CM | POA: Diagnosis not present

## 2020-07-25 DIAGNOSIS — E785 Hyperlipidemia, unspecified: Secondary | ICD-10-CM

## 2020-07-25 DIAGNOSIS — Z86718 Personal history of other venous thrombosis and embolism: Secondary | ICD-10-CM

## 2020-07-25 DIAGNOSIS — Z0000 Encounter for general adult medical examination without abnormal findings: Secondary | ICD-10-CM | POA: Diagnosis not present

## 2020-07-25 DIAGNOSIS — D6859 Other primary thrombophilia: Secondary | ICD-10-CM | POA: Diagnosis not present

## 2020-07-25 DIAGNOSIS — Z79899 Other long term (current) drug therapy: Secondary | ICD-10-CM | POA: Diagnosis not present

## 2020-07-25 DIAGNOSIS — Z1211 Encounter for screening for malignant neoplasm of colon: Secondary | ICD-10-CM

## 2020-07-25 DIAGNOSIS — R413 Other amnesia: Secondary | ICD-10-CM | POA: Diagnosis not present

## 2020-07-25 MED ORDER — ENOXAPARIN SODIUM 40 MG/0.4ML ~~LOC~~ SOLN: 40.0000 mg | SUBCUTANEOUS | 3 refills | Status: AC

## 2020-07-25 NOTE — Patient Instructions (Addendum)
Try the SAGE self test on line.  Colonoscopy referral. For  colon screening ,   Get appt for fasting labs .  Will refill  Lovenox.  Cholesterol med after lipid results are back .     Health Maintenance, Female Adopting a healthy lifestyle and getting preventive care are important in promoting health and wellness. Ask your health care provider about:  The right schedule for you to have regular tests and exams.  Things you can do on your own to prevent diseases and keep yourself healthy. What should I know about diet, weight, and exercise? Eat a healthy diet  Eat a diet that includes plenty of vegetables, fruits, low-fat dairy products, and lean protein.  Do not eat a lot of foods that are high in solid fats, added sugars, or sodium.   Maintain a healthy weight Body mass index (BMI) is used to identify weight problems. It estimates body fat based on height and weight. Your health care provider can help determine your BMI and help you achieve or maintain a healthy weight. Get regular exercise Get regular exercise. This is one of the most important things you can do for your health. Most adults should:  Exercise for at least 150 minutes each week. The exercise should increase your heart rate and make you sweat (moderate-intensity exercise).  Do strengthening exercises at least twice a week. This is in addition to the moderate-intensity exercise.  Spend less time sitting. Even light physical activity can be beneficial. Watch cholesterol and blood lipids Have your blood tested for lipids and cholesterol at 46 years of age, then have this test every 5 years. Have your cholesterol levels checked more often if:  Your lipid or cholesterol levels are high.  You are older than 46 years of age.  You are at high risk for heart disease. What should I know about cancer screening? Depending on your health history and family history, you may need to have cancer screening at various ages.  This may include screening for:  Breast cancer.  Cervical cancer.  Colorectal cancer.  Skin cancer.  Lung cancer. What should I know about heart disease, diabetes, and high blood pressure? Blood pressure and heart disease  High blood pressure causes heart disease and increases the risk of stroke. This is more likely to develop in people who have high blood pressure readings, are of African descent, or are overweight.  Have your blood pressure checked: ? Every 3-5 years if you are 11-75 years of age. ? Every year if you are 5 years old or older. Diabetes Have regular diabetes screenings. This checks your fasting blood sugar level. Have the screening done:  Once every three years after age 28 if you are at a normal weight and have a low risk for diabetes.  More often and at a younger age if you are overweight or have a high risk for diabetes. What should I know about preventing infection? Hepatitis B If you have a higher risk for hepatitis B, you should be screened for this virus. Talk with your health care provider to find out if you are at risk for hepatitis B infection. Hepatitis C Testing is recommended for:  Everyone born from [birth year] through 1966.  Anyone with known risk factors for hepatitis C. Sexually transmitted infections (STIs)  Get screened for STIs, including gonorrhea and chlamydia, if: ? You are sexually active and are younger than 46 years of age. ? You are older than 46 years of age and your  health care provider tells you that you are at risk for this type of infection. ? Your sexual activity has changed since you were last screened, and you are at increased risk for chlamydia or gonorrhea. Ask your health care provider if you are at risk.  Ask your health care provider about whether you are at high risk for HIV. Your health care provider may recommend a prescription medicine to help prevent HIV infection. If you choose to take medicine to prevent HIV, you  should first get tested for HIV. You should then be tested every 3 months for as long as you are taking the medicine. Pregnancy  If you are about to stop having your period (premenopausal) and you may become pregnant, seek counseling before you get pregnant.  Take 400 to 800 micrograms (mcg) of folic acid every day if you become pregnant.  Ask for birth control (contraception) if you want to prevent pregnancy. Osteoporosis and menopause Osteoporosis is a disease in which the bones lose minerals and strength with aging. This can result in bone fractures. If you are 44 years old or older, or if you are at risk for osteoporosis and fractures, ask your health care provider if you should:  Be screened for bone loss.  Take a calcium or vitamin D supplement to lower your risk of fractures.  Be given hormone replacement therapy (HRT) to treat symptoms of menopause. Follow these instructions at home: Lifestyle  Do not use any products that contain nicotine or tobacco, such as cigarettes, e-cigarettes, and chewing tobacco. If you need help quitting, ask your health care provider.  Do not use street drugs.  Do not share needles.  Ask your health care provider for help if you need support or information about quitting drugs. Alcohol use  Do not drink alcohol if: ? Your health care provider tells you not to drink. ? You are pregnant, may be pregnant, or are planning to become pregnant.  If you drink alcohol: ? Limit how much you use to 0-1 drink a day. ? Limit intake if you are breastfeeding.  Be aware of how much alcohol is in your drink. In the [place] one drink equals one 12 oz bottle of beer (355 mL), one 5 oz glass of wine (148 mL), or one 1 oz glass of hard liquor (44 mL). General instructions  Schedule regular health, dental, and eye exams.  Stay current with your vaccines.  Tell your health care provider if: ? You often feel depressed. ? You have ever been abused or do not feel  safe at home. Summary  Adopting a healthy lifestyle and getting preventive care are important in promoting health and wellness.  Follow your health care provider's instructions about healthy diet, exercising, and getting tested or screened for diseases.  Follow your health care provider's instructions on monitoring your cholesterol and blood pressure. This information is not intended to replace advice given to you by your health care provider. Make sure you discuss any questions you have with your health care provider. Document Revised: 03/12/2018 Document Reviewed: 03/12/2018 Elsevier Patient Education  2021 [employer].

## 2020-08-03 ENCOUNTER — Other Ambulatory Visit: Payer: Self-pay

## 2020-08-03 ENCOUNTER — Other Ambulatory Visit (INDEPENDENT_AMBULATORY_CARE_PROVIDER_SITE_OTHER): Payer: 59

## 2020-08-03 DIAGNOSIS — R413 Other amnesia: Secondary | ICD-10-CM

## 2020-08-03 DIAGNOSIS — E785 Hyperlipidemia, unspecified: Secondary | ICD-10-CM | POA: Diagnosis not present

## 2020-08-03 DIAGNOSIS — D6859 Other primary thrombophilia: Secondary | ICD-10-CM

## 2020-08-03 DIAGNOSIS — Z79899 Other long term (current) drug therapy: Secondary | ICD-10-CM

## 2020-08-03 DIAGNOSIS — Z0000 Encounter for general adult medical examination without abnormal findings: Secondary | ICD-10-CM | POA: Diagnosis not present

## 2020-08-03 LAB — BASIC METABOLIC PANEL
BUN: 12 mg/dL (ref 6–23)
CO2: 28 mEq/L (ref 19–32)
Calcium: 9.2 mg/dL (ref 8.4–10.5)
Chloride: 103 mEq/L (ref 96–112)
Creatinine, Ser: 0.86 mg/dL (ref 0.40–1.20)
GFR: 81.24 mL/min (ref >60.00)
Glucose, Bld: 121 mg/dL — ABNORMAL HIGH (ref 70–99)
Potassium: 4.1 mEq/L (ref 3.5–5.1)
Sodium: 139 mEq/L (ref 135–145)

## 2020-08-03 LAB — TSH: TSH: 1 u[IU]/mL (ref 0.35–4.50)

## 2020-08-03 LAB — CBC WITH DIFFERENTIAL/PLATELET
Basophils Absolute: 0 10*3/uL (ref 0.0–0.1)
Basophils Relative: 0.4 % (ref 0.0–3.0)
Eosinophils Absolute: 0.1 10*3/uL (ref 0.0–0.7)
Eosinophils Relative: 1.7 % (ref 0.0–5.0)
HCT: 41 % (ref 36.0–46.0)
Hemoglobin: 14 g/dL (ref 12.0–15.0)
Lymphocytes Relative: 32.7 % (ref 12.0–46.0)
Lymphs Abs: 2.3 10*3/uL (ref 0.7–4.0)
MCHC: 34.1 g/dL (ref 30.0–36.0)
MCV: 93.4 fl (ref 78.0–100.0)
Monocytes Absolute: 0.4 10*3/uL (ref 0.1–1.0)
Monocytes Relative: 5.3 % (ref 3.0–12.0)
Neutro Abs: 4.2 10*3/uL (ref 1.4–7.7)
Neutrophils Relative %: 59.9 % (ref 43.0–77.0)
Platelets: 191 10*3/uL (ref 150.0–400.0)
RBC: 4.39 Mil/uL (ref 3.87–5.11)
RDW: 12.3 % (ref 11.5–15.5)
WBC: 6.9 10*3/uL (ref 4.0–10.5)

## 2020-08-03 LAB — HEPATIC FUNCTION PANEL
ALT: 20 U/L (ref 0–35)
AST: 16 U/L (ref 0–37)
Albumin: 4.1 g/dL (ref 3.5–5.2)
Alkaline Phosphatase: 71 U/L (ref 39–117)
Bilirubin, Direct: 0.2 mg/dL (ref 0.0–0.3)
Total Bilirubin: 1.2 mg/dL (ref 0.2–1.2)
Total Protein: 7.1 g/dL (ref 6.0–8.3)

## 2020-08-03 LAB — LIPID PANEL
Cholesterol: 227 mg/dL — ABNORMAL HIGH (ref 0–200)
HDL: 47 mg/dL (ref >39.00)
LDL Cholesterol: 163 mg/dL — ABNORMAL HIGH (ref 0–99)
NonHDL: 180.42
Total CHOL/HDL Ratio: 5
Triglycerides: 85 mg/dL (ref 0.0–149.0)
VLDL: 17 mg/dL (ref 0.0–40.0)

## 2020-08-03 LAB — VITAMIN B12: Vitamin B-12: 326 pg/mL (ref 211–911)

## 2020-08-03 LAB — HEMOGLOBIN A1C: Hgb A1c MFr Bld: 6.1 % (ref 4.6–6.5)

## 2020-08-03 LAB — VITAMIN D 25 HYDROXY (VIT D DEFICIENCY, FRACTURES): VITD: 25.6 ng/mL — ABNORMAL LOW (ref 30.00–100.00)

## 2020-08-03 LAB — T4, FREE: Free T4: 0.69 ng/dL (ref 0.60–1.60)

## 2020-08-04 ENCOUNTER — Other Ambulatory Visit: Payer: 59

## 2020-08-06 NOTE — Progress Notes (Signed)
Blood sugar is creeping up  in prediabetic range  .  Cholesterol up  also .  Advise intensify  healthy  diet and exercise  avoiding simple processed carbohydrates . Plan  rov in 3-4 months  and we can fu  the blood sugar readings .

## 2020-08-10 NOTE — Progress Notes (Signed)
Please send in generic rosuvastatin 10 mg once a day dispense 90 1 refills.   Plan follow-up lipid panel in 3 to 6 months after beginning

## 2020-08-16 MED ORDER — ROSUVASTATIN CALCIUM 10 MG PO TABS: 10.0000 mg | ORAL_TABLET | Freq: Every day | ORAL | 1 refills | Status: AC

## 2020-08-18 ENCOUNTER — Telehealth: Payer: Self-pay | Admitting: Internal Medicine

## 2020-08-18 NOTE — Telephone Encounter (Signed)
I spoke with the patient and a virtual visit was scheduled with Dr. Salomon Fick on 08/18/2020. I informed the patient to seek emergency medical care if her condition worsens or if she is having any difficulty breathing. Patient verbalized understanding.

## 2020-08-18 NOTE — Telephone Encounter (Signed)
Pt called to inform the doctor that she tested positive on Tuesday for COVID; she had a PCR. She has underline medical conditions v5 laden. [phone]

## 2020-08-19 ENCOUNTER — Encounter: Payer: Self-pay | Admitting: Family Medicine

## 2020-08-19 ENCOUNTER — Telehealth (INDEPENDENT_AMBULATORY_CARE_PROVIDER_SITE_OTHER): Payer: 59 | Admitting: Family Medicine

## 2020-08-19 DIAGNOSIS — U071 COVID-19: Secondary | ICD-10-CM

## 2020-08-19 DIAGNOSIS — D6851 Activated protein C resistance: Secondary | ICD-10-CM | POA: Diagnosis not present

## 2020-08-19 NOTE — Progress Notes (Signed)
Virtual Visit via Video Note  I connected with Autumn Cruz on 08/19/20 at  3:00 PM EDT by a video enabled telemedicine application 2/2 COVID-19 pandemic and verified that I am speaking with the correct person using two identifiers.  Location patient: home Location provider:work or home office Persons participating in the virtual visit: patient, provider  I discussed the limitations of evaluation and management by telemedicine and the availability of in person appointments. The patient expressed understanding and agreed to proceed.   HPI: Pt tested positive for COVID 2 days ago after having symptoms which started 08/16/20.  Pt has factor V leiden.  Taking ASA 81 mg daily and lovenox if traveling.  Pt concerned about PE as having chest tightness but not pain.  Denies SOB.  pO2 92% when laying down.  Muscles in back are sore.  Started Plaxovid Thursday evening as rx'd by her cousin who is a Development worker, community.  ROS: See pertinent positives and negatives per HPI.  Past Medical History:  Diagnosis Date  . Arthritis   . DVT (deep venous thrombosis) (HCC)   . Gestational diabetes   . High cholesterol   . Hx of thrombosis 2014   Left lower extrm.  Marland Kitchen Positive TB test   . Post-phlebitic syndrome 2014   Left  LE  . Thrombosis    Left leg preg tulip filter then removed.   . Tuberculosis    Positive test    Past Surgical History:  Procedure Laterality Date  . CESAREAN SECTION  2010 & 2012    Family History  Problem Relation Age of Onset  . Heart disease Mother        Blockage/Main artery  . Diabetes Mother   . Coronary artery disease Mother   . Hyperlipidemia Mother   . Hypertension Father   . Deep vein thrombosis Father   . Renal Disease Sister   . High Cholesterol Sister   . High Cholesterol Sister   . High Cholesterol Sister     Current Outpatient Medications:  .  aspirin 81 MG tablet, Take 81 mg by mouth as needed., Disp: , Rfl:  .  enoxaparin (LOVENOX) 40 MG/0.4ML injection, Inject  0.4 mLs (40 mg total) into the skin daily. With travel, Disp: 12 mL, Rfl: 3 .  rosuvastatin (CRESTOR) 10 MG tablet, Take 1 tablet (10 mg total) by mouth daily., Disp: 90 tablet, Rfl: 1  EXAM:  VITALS per patient if applicable:  GENERAL: alert, oriented, appears well and in no acute distress  HEENT: atraumatic, conjunctiva clear, no obvious abnormalities on inspection of external nose and ears  NECK: normal movements of the head and neck  LUNGS: on inspection no signs of respiratory distress, breathing rate appears normal, no obvious gross SOB, gasping or wheezing  CV: no obvious cyanosis  MS: moves all visible extremities without noticeable abnormality  PSYCH/NEURO: pleasant and cooperative, no obvious depression or anxiety, speech and thought processing grossly intact  ASSESSMENT AND PLAN:  Discussed the following assessment and plan:  COVID-19 virus infection -Patient tested positive on 08/17/2020 with symptoms starting 2 days prior on 5/17 -Discussed treatment of symptoms including rest, Tylenol, hydration, OTC cough/cold medication -Continue Paxlovid -Given chest discomfort while laying down in person follow-up advised -Patient to proceed to nearest ED or UC  Factor V Leiden (HCC) -Continue ASA 81 mg daily -Has Rx for Lovenox with refills.  Patient to proceed for ED or UC for further evaluation   I discussed the assessment and treatment plan with the  patient. The patient was provided an opportunity to ask questions and all were answered. The patient agreed with the plan and demonstrated an understanding of the instructions.   The patient was advised to call back or seek an in-person evaluation if the symptoms worsen or if the condition fails to improve as anticipated.  Deeann Saint, MD

## 2020-08-23 ENCOUNTER — Emergency Department (HOSPITAL_COMMUNITY)
Admission: EM | Admit: 2020-08-23 | Discharge: 2020-08-24 | Disposition: A | Discharge status: Home / Self Care | Payer: 59 | Attending: Emergency Medicine | Admitting: Emergency Medicine

## 2020-08-23 ENCOUNTER — Encounter (HOSPITAL_COMMUNITY): Payer: Self-pay | Admitting: Emergency Medicine

## 2020-08-23 ENCOUNTER — Emergency Department (HOSPITAL_COMMUNITY): Payer: 59

## 2020-08-23 ENCOUNTER — Other Ambulatory Visit: Payer: Self-pay

## 2020-08-23 DIAGNOSIS — U071 COVID-19: Secondary | ICD-10-CM | POA: Diagnosis not present

## 2020-08-23 DIAGNOSIS — R059 Cough, unspecified: Secondary | ICD-10-CM

## 2020-08-23 DIAGNOSIS — M79605 Pain in left leg: Secondary | ICD-10-CM | POA: Diagnosis not present

## 2020-08-23 DIAGNOSIS — M7989 Other specified soft tissue disorders: Secondary | ICD-10-CM | POA: Insufficient documentation

## 2020-08-23 DIAGNOSIS — R928 Other abnormal and inconclusive findings on diagnostic imaging of breast: Secondary | ICD-10-CM

## 2020-08-23 DIAGNOSIS — Z7982 Long term (current) use of aspirin: Secondary | ICD-10-CM | POA: Diagnosis not present

## 2020-08-23 DIAGNOSIS — R0789 Other chest pain: Secondary | ICD-10-CM | POA: Diagnosis present

## 2020-08-23 DIAGNOSIS — Z8616 Personal history of COVID-19: Secondary | ICD-10-CM | POA: Diagnosis not present

## 2020-08-23 HISTORY — DX: COVID-19: U07.1

## 2020-08-23 LAB — CBC WITH DIFFERENTIAL/PLATELET
Abs Immature Granulocytes: 0.03 10*3/uL (ref 0.00–0.07)
Basophils Absolute: 0 10*3/uL (ref 0.0–0.1)
Basophils Relative: 0 %
Eosinophils Absolute: 0.2 10*3/uL (ref 0.0–0.5)
Eosinophils Relative: 2 %
HCT: 44.9 % (ref 36.0–46.0)
Hemoglobin: 14.6 g/dL (ref 12.0–15.0)
Immature Granulocytes: 0 %
Lymphocytes Relative: 38 %
Lymphs Abs: 3.6 10*3/uL (ref 0.7–4.0)
MCH: 31.3 pg (ref 26.0–34.0)
MCHC: 32.5 g/dL (ref 30.0–36.0)
MCV: 96.1 fL (ref 80.0–100.0)
Monocytes Absolute: 0.6 10*3/uL (ref 0.1–1.0)
Monocytes Relative: 6 %
Neutro Abs: 5.2 10*3/uL (ref 1.7–7.7)
Neutrophils Relative %: 54 %
Platelets: 244 10*3/uL (ref 150–400)
RBC: 4.67 MIL/uL (ref 3.87–5.11)
RDW: 11.6 % (ref 11.5–15.5)
WBC: 9.5 10*3/uL (ref 4.0–10.5)
nRBC: 0 % (ref 0.0–0.2)

## 2020-08-23 LAB — COMPREHENSIVE METABOLIC PANEL
ALT: 25 U/L (ref 0–44)
AST: 20 U/L (ref 15–41)
Albumin: 3.6 g/dL (ref 3.5–5.0)
Alkaline Phosphatase: 76 U/L (ref 38–126)
Anion gap: 10 (ref 5–15)
BUN: 11 mg/dL (ref 6–20)
CO2: 24 mmol/L (ref 22–32)
Calcium: 9.2 mg/dL (ref 8.9–10.3)
Chloride: 100 mmol/L (ref 98–111)
Creatinine, Ser: 0.97 mg/dL (ref 0.44–1.00)
GFR, Estimated: >60 mL/min (ref >60)
Glucose, Bld: 132 mg/dL — ABNORMAL HIGH (ref 70–99)
Potassium: 3.7 mmol/L (ref 3.5–5.1)
Sodium: 134 mmol/L — ABNORMAL LOW (ref 135–145)
Total Bilirubin: 0.7 mg/dL (ref 0.3–1.2)
Total Protein: 7.2 g/dL (ref 6.5–8.1)

## 2020-08-23 LAB — PROTIME-INR
INR: 0.9 (ref 0.8–1.2)
Prothrombin Time: 12 s (ref 11.4–15.2)

## 2020-08-23 LAB — I-STAT BETA HCG BLOOD, ED (MC, WL, AP ONLY): I-stat hCG, quantitative: <5 m[IU]/mL (ref <5)

## 2020-08-23 LAB — TROPONIN I (HIGH SENSITIVITY): Troponin I (High Sensitivity): 4 ng/L (ref <18)

## 2020-08-23 MED ORDER — IOHEXOL 350 MG/ML SOLN
50.0000 mL | Freq: Once | INTRAVENOUS | Status: AC | PRN
  Administered 2020-08-23: 50 mL via INTRAVENOUS

## 2020-08-23 NOTE — ED Provider Notes (Signed)
Emergency Medicine Provider Triage Evaluation Note  Autumn Cruz , a 45 y.o. female  was evaluated in triage.  Pt complains of chest pain and shortness of breath beginning about a week ago.  The chest pain is bilateral, pressure and tightness, radiating to the back.  This chest discomfort and shortness of breath feels similar to her PE she previously experienced. She has also been experiencing left calf discomfort and swelling.   History of factor V Leiden, PE, current COVID.  Review of Systems  Positive: Shortness of breath, chest pain Negative: Syncope  Physical Exam  BP 108/66 (BP Location: Left Arm)   Pulse 83   Temp 97.9 F (36.6 C) (Oral)   Resp 18   LMP 08/12/2020   SpO2 99%  Gen:   Awake, no distress   Resp:  Normal effort  MSK:   Moves extremities without difficulty  Other:    Medical Decision Making  Medically screening exam initiated at 9:54 PM.  Appropriate orders placed.  Valita Pellman was informed that the remainder of the evaluation will be completed by another provider, this initial triage assessment does not replace that evaluation, and the importance of remaining in the ED until their evaluation is complete.     Anselm Pancoast, PA-C 08/23/20 2231    Charlynne Pander, MD 08/24/20 Jorje Guild

## 2020-08-23 NOTE — ED Triage Notes (Signed)
Patient tested positive for Covid19 last Tuesday , reports chest tightness with occasional dry cough and mild SOB , no fever or chills .

## 2020-08-24 ENCOUNTER — Ambulatory Visit (HOSPITAL_COMMUNITY): Admission: RE | Admit: 2020-08-24 | Payer: 59 | Source: Ambulatory Visit

## 2020-08-24 ENCOUNTER — Ambulatory Visit (HOSPITAL_BASED_OUTPATIENT_CLINIC_OR_DEPARTMENT_OTHER)
Admission: RE | Admit: 2020-08-24 | Discharge: 2020-08-24 | Disposition: A | Discharge status: Home / Self Care | Payer: 59 | Source: Ambulatory Visit | Attending: Emergency Medicine | Admitting: Emergency Medicine

## 2020-08-24 DIAGNOSIS — M7989 Other specified soft tissue disorders: Secondary | ICD-10-CM | POA: Insufficient documentation

## 2020-08-24 DIAGNOSIS — M79605 Pain in left leg: Secondary | ICD-10-CM | POA: Insufficient documentation

## 2020-08-24 DIAGNOSIS — R609 Edema, unspecified: Secondary | ICD-10-CM

## 2020-08-24 LAB — TROPONIN I (HIGH SENSITIVITY): Troponin I (High Sensitivity): 5 ng/L (ref <18)

## 2020-08-24 MED ORDER — BENZONATATE 100 MG PO CAPS
100.0000 mg | ORAL_CAPSULE | Freq: Once | ORAL | Status: AC
  Administered 2020-08-24: 100 mg via ORAL
  Filled 2020-08-24: qty 1

## 2020-08-24 MED ORDER — ALBUTEROL SULFATE HFA 108 (90 BASE) MCG/ACT IN AERS: 2.0000 | INHALATION_SPRAY | RESPIRATORY_TRACT | 0 refills | Status: AC | PRN

## 2020-08-24 MED ORDER — ALBUTEROL SULFATE HFA 108 (90 BASE) MCG/ACT IN AERS
2.0000 | INHALATION_SPRAY | RESPIRATORY_TRACT | Status: DC | PRN
  Administered 2020-08-24: 2 via RESPIRATORY_TRACT
  Filled 2020-08-24: qty 6.7

## 2020-08-24 MED ORDER — BENZONATATE 100 MG PO CAPS: 100.0000 mg | ORAL_CAPSULE | Freq: Two times a day (BID) | ORAL | 0 refills | Status: AC | PRN

## 2020-08-24 NOTE — Progress Notes (Signed)
Lower extremity venous has been completed.   Preliminary results in CV Proc.   Blanch Media 08/24/2020 1:30 PM

## 2020-08-24 NOTE — ED Notes (Signed)
Pt oxygen levels started at 100% lying and dropped to 97% when ambulating. Pt reported little to no shortness of breath/dizziness while ambulating. Pt requested ultrasound because her leg is sore, and has history of blood clot in leg. Pt also requesting d-dimer levels checked. RN notified.

## 2020-08-24 NOTE — ED Notes (Signed)
Patient verbalizes understanding of discharge instructions. Pt has an ultrasound order for in the morning. Pt instructed on where to go get US done. Opportunity for questioning and answers were provided. Armband removed by staff, pt discharged from ED ambulatory.

## 2020-08-24 NOTE — ED Provider Notes (Signed)
[facility] EMERGENCY DEPARTMENT Provider Note   CSN: [id number] Arrival date & time: 08/23/20  2103     History Chief Complaint  Patient presents with  . Covid+ / Chest Tightness    Autumn Cruz is a 45 y.o. female.  Patient with past medical history notable for prior PE and DVT, taking a baby aspirin daily, presents to the emergency department with a chief complaint of cough.  She states she was diagnosed with COVID-19 7 days ago.  She has been taking Paxlovid.  She reports worsening cough.  She is concerned because she has had blood clots before.  The history is provided by the patient. No language interpreter was used.       Past Medical History:  Diagnosis Date  . Arthritis   . COVID-19   . DVT (deep venous thrombosis) (HCC)   . Gestational diabetes   . High cholesterol   . Hx of thrombosis 2014   Left lower extrm.  Marland Kitchen Positive TB test   . Post-phlebitic syndrome 2014   Left  LE  . Thrombosis    Left leg preg tulip filter then removed.   . Tuberculosis    Positive test    Patient Active Problem List   Diagnosis Date Noted  . Fibromatosis, plantar 07/10/2017  . DDD (degenerative disc disease), cervical 05/22/2017  . Primary osteoarthritis of both knees 05/22/2017  . Polyarthralgia 02/28/2017  . Degenerative tear of medial meniscus, right 04/09/2016  . Degenerative arthritis of knee, bilateral 01/31/2016  . Skin lesion of left leg 05/30/2014  . Hx gestational diabetes 02/04/2013  . Visit for preventive health examination 02/04/2013  . Long term (current) use of anticoagulants 09/29/2012  . Family history of premature CAD 09/24/2012  . Hyperlipidemia 09/24/2012  . Hx of thrombosis of lower extremity in pregnancy 09/24/2012  . Hypercoagulable state (HCC) 09/24/2012  . Factor V Leiden mutation (HCC) 09/24/2012  . Elevated C-reactive protein (CRP) 09/24/2012  . Post-phlebitic syndrome 09/24/2012    Past Surgical History:  Procedure  Laterality Date  . CESAREAN SECTION  2010 & 2012     OB History    Gravida  2   Para  2   Term      Preterm      AB      Living        SAB      IAB      Ectopic      Multiple      Live Births              Family History  Problem Relation Age of Onset  . Heart disease Mother        Blockage/Main artery  . Diabetes Mother   . Coronary artery disease Mother   . Hyperlipidemia Mother   . Hypertension Father   . Deep vein thrombosis Father   . Renal Disease Sister   . High Cholesterol Sister   . High Cholesterol Sister   . High Cholesterol Sister     Social History   Tobacco Use  . Smoking status: Never Smoker  . Smokeless tobacco: Never Used  Vaping Use  . Vaping Use: Never used  Substance Use Topics  . Alcohol use: No    Alcohol/week: 0.0 standard drinks  . Drug use: No    Home Medications Prior to Admission medications   Medication Sig Start Date End Date Taking? Authorizing Provider  aspirin 81 MG tablet Take 81 mg by mouth  as needed.    [provider]  enoxaparin (LOVENOX) 40 MG/0.4ML injection Inject 0.4 mLs (40 mg total) into the skin daily. With travel 07/25/20   Panosh, Neta Mends, MD  HYDROcodone bit-homatropine (HYCODAN) 5-1.5 MG/5ML syrup hydrocodone-homatropine 5 mg-1.5 mg/5 mL oral syrup    [provider]  PAXLOVID 20 x 150 MG & 10 x 100MG  TBPK Take by mouth. 08/17/20   [provider]  rosuvastatin (CRESTOR) 10 MG tablet Take 1 tablet (10 mg total) by mouth daily. 08/16/20   Panosh, 08/18/20, MD    Allergies    Patient has no known allergies.  Review of Systems   Review of Systems  All other systems reviewed and are negative.   Physical Exam Updated Vital Signs BP 105/90 (BP Location: Left Arm)   Pulse 82   Temp 98 F (36.7 C) (Oral)   Resp 18   LMP 08/12/2020   SpO2 99%   Physical Exam Vitals and nursing note reviewed.  Constitutional:      General: She is not in acute distress.     Appearance: She is well-developed.     Comments: Coughing  HENT:     Head: Normocephalic and atraumatic.  Eyes:     Conjunctiva/sclera: Conjunctivae normal.  Cardiovascular:     Rate and Rhythm: Normal rate and regular rhythm.     Heart sounds: No murmur heard.   Pulmonary:     Effort: Pulmonary effort is normal. No respiratory distress.     Breath sounds: Normal breath sounds.  Abdominal:     Palpations: Abdomen is soft.     Tenderness: There is no abdominal tenderness.  Musculoskeletal:        General: Normal range of motion.     Cervical back: Neck supple.  Skin:    General: Skin is warm and dry.  Neurological:     Mental Status: She is alert and oriented to person, place, and time.  Psychiatric:        Mood and Affect: Mood normal.        Behavior: Behavior normal.     ED Results / Procedures / Treatments   Labs (all labs ordered are listed, but only abnormal results are displayed) Labs Reviewed  COMPREHENSIVE METABOLIC PANEL - Abnormal; Notable for the following components:      Result Value   Sodium 134 (*)    Glucose, Bld 132 (*)    All other components within normal limits  CBC WITH DIFFERENTIAL/PLATELET  PROTIME-INR  I-STAT BETA HCG BLOOD, ED ([facility], [facility], [facility] ONLY)  TROPONIN I (HIGH SENSITIVITY)  TROPONIN I (HIGH SENSITIVITY)    EKG None  Radiology CT Angio Chest PE W and/or Wo Contrast  Result Date: 08/24/2020 CLINICAL DATA:  Patient tested positive for Covid19 last Tuesday , reports chest tightness with occasional dry cough and mild SOB , no fever or chills . EXAM: CT ANGIOGRAPHY CHEST WITH CONTRAST TECHNIQUE: Multidetector CT imaging of the chest was performed using the standard protocol during bolus administration of intravenous contrast. Multiplanar CT image reconstructions and MIPs were obtained to evaluate the vascular anatomy. CONTRAST:  13mL OMNIPAQUE IOHEXOL 350 MG/ML SOLN COMPARISON:  Mammography 06/17/2015. FINDINGS: Cardiovascular: Satisfactory  opacification of the pulmonary arteries to the segmental level. No evidence of pulmonary embolism. Normal heart size. No significant pericardial effusion. The thoracic aorta is normal in caliber. Mild atherosclerotic plaque of the thoracic aorta. No coronary artery calcifications. Mediastinum/Nodes: No enlarged mediastinal, hilar, or axillary lymph nodes. Thyroid gland,  trachea, and esophagus demonstrate no significant findings. Lungs/Pleura: Expiratory phase of respiration. No focal consolidation. No pulmonary nodule. No pulmonary mass. No pleural effusion. No pneumothorax. Upper Abdomen: No acute abnormality. Musculoskeletal: Asymmetric left breast tissue compared to the right. No suspicious lytic or blastic osseous lesions. No acute displaced fracture. Multilevel degenerative changes of the spine. Review of the MIP images confirms the above findings. IMPRESSION: 1. No pulmonary embolus. 2. No acute intrathoracic abnormality. 3. Asymmetric left breast tissue. Recommend correlation with mammography. Electronically Signed   By: Tish Frederickson M.D.   On: 08/24/2020 00:01    Procedures Procedures   Medications Ordered in ED Medications  iohexol (OMNIPAQUE) 350 MG/ML injection 50 mL (50 mLs Intravenous Contrast Given 08/23/20 2334)    ED Course  I have reviewed the triage vital signs and the nursing notes.  Pertinent labs & imaging results that were available during my care of the patient were reviewed by me and considered in my medical decision making (see chart for details).    MDM Rules/Calculators/A&P                          Autumn Cruz was evaluated in Emergency Department on 08/24/2020 for the symptoms described in the history of present illness. She was evaluated in the context of the global COVID-19 pandemic, which necessitated consideration that the patient might be at risk for infection with the SARS-CoV-2 virus that causes COVID-19. Institutional protocols and algorithms that pertain to  the evaluation of patients at risk for COVID-19 are in a state of rapid change based on information released by regulatory bodies including the CDC and federal and state organizations. These policies and algorithms were followed during the patient's care in the ED.  Patient here with persistent cough and chest tightness.  Diagnosed with COVID 1 week ago.  She has been on Paxlovid.  CT PE study ordered in triage by triage provider is negative for PE.  There is incidental finding of asymmetric breast tissue, patient is referred back to PCP for mammogram.  Will prescribe Tessalon Perles and an inhaler.  She is not hypoxic while in the emergency department.  She does not appear to be in any acute distress.  I do not think she requires any further work-up.  Ultrasound of her extremity was ordered in triage.  She has received instructions to have the study completed in the morning. Final Clinical Impression(s) / ED Diagnoses Final diagnoses:  COVID-19  Cough  Abnormal finding on breast imaging    Rx / DC Orders ED Discharge Orders    None       Roxy Horseman, PA-C 08/24/20 0240    Mesner, Barbara Cower, MD 08/24/20 [phone]

## 2020-08-24 NOTE — Discharge Instructions (Addendum)
The CT scan of your chest revealed no evidence of blood clot.  D-dimer blood test is not useful for assessing risk of blood clot in the leg in the setting of COVID-19 infection.  The appropriate test is the ultrasound which has been ordered for tomorrow.  There was an incidental finding of asymmetric breast tissue.  It is recommended that you follow-up with your primary care doctor and have a mammogram for further evaluation.  Please take the cough medicine and use the inhaler as directed.  You can increase your Aspirin dose to 325mg  daily while you are recovering.  Our triage provider ordered an ultrasound of your leg.  This cannot be completed at night and must be done after 7:00 in the morning.  You can return tomorrow anytime after 7:00 and go to the ultrasound department to have this study completed.

## 2020-08-30 ENCOUNTER — Telehealth: Payer: Self-pay | Admitting: Internal Medicine

## 2020-08-30 NOTE — Telephone Encounter (Signed)
Patient dropped off form to be completed by Dr. Fabian Sharp for her insurance.   Patient would like form to be faxed to [phone] once completed.  Form will be placed in folder.  Please advise.

## 2020-08-31 NOTE — Telephone Encounter (Signed)
Form signed , on your desk

## 2020-08-31 NOTE — Telephone Encounter (Signed)
Form filled out and placed in red folder for review.

## 2020-09-01 NOTE — Telephone Encounter (Signed)
Forms faxed to [phone]

## 2020-12-22 ENCOUNTER — Other Ambulatory Visit: Payer: Self-pay | Admitting: Internal Medicine

## 2021-04-17 NOTE — Progress Notes (Deleted)
Office Visit Note  Patient: Autumn Cruz             Date of Birth: 1976/02/03           MRN: [id number]             PCP: Burnis Medin, MD Referring: Burnis Medin, MD Visit Date: 05/01/2021 Occupation: @GUAROCC @  Subjective:  No chief complaint on file.   History of Present Illness: Autumn Cruz is a 46 y.o. female ***   Activities of Daily Living:  Patient reports morning stiffness for *** {minute/hour:19697}.   Patient {ACTIONS;DENIES/REPORTS:21021675::"Denies"} nocturnal pain.  Difficulty dressing/grooming: {ACTIONS;DENIES/REPORTS:21021675::"Denies"} Difficulty climbing stairs: {ACTIONS;DENIES/REPORTS:21021675::"Denies"} Difficulty getting out of chair: {ACTIONS;DENIES/REPORTS:21021675::"Denies"} Difficulty using hands for taps, buttons, cutlery, and/or writing: {ACTIONS;DENIES/REPORTS:21021675::"Denies"}  No Rheumatology ROS completed.   PMFS History:  Patient Active Problem List   Diagnosis Date Noted   Fibromatosis, plantar 07/10/2017   DDD (degenerative disc disease), cervical 05/22/2017   Primary osteoarthritis of both knees 05/22/2017   Polyarthralgia 02/28/2017   Degenerative tear of medial meniscus, right 04/09/2016   Degenerative arthritis of knee, bilateral 01/31/2016   Skin lesion of left leg 05/30/2014   Hx gestational diabetes 02/04/2013   Visit for preventive health examination 02/04/2013   Long term (current) use of anticoagulants 09/29/2012   Family history of premature CAD 09/24/2012   Hyperlipidemia 09/24/2012   Hx of thrombosis of lower extremity in pregnancy 09/24/2012   Hypercoagulable state ([place]) 09/24/2012   Factor V Leiden mutation ([place]) 09/24/2012   Elevated C-reactive protein (CRP) 09/24/2012   Post-phlebitic syndrome 09/24/2012    Past Medical History:  Diagnosis Date   Arthritis    COVID-19    DVT (deep venous thrombosis) ([place])    Gestational diabetes    High cholesterol    Hx of thrombosis 2014   Left lower extrm.   Positive  TB test    Post-phlebitic syndrome 2014   Left  LE   Thrombosis    Left leg preg tulip filter then removed.    Tuberculosis    Positive test    Family History  Problem Relation Age of Onset   Heart disease Mother        Blockage/Main artery   Diabetes Mother    Coronary artery disease Mother    Hyperlipidemia Mother    Hypertension Father    Deep vein thrombosis Father    Renal Disease Sister    High Cholesterol Sister    High Cholesterol Sister    High Cholesterol Sister    Past Surgical History:  Procedure Laterality Date   CESAREAN SECTION  2010 & 2012   Social History   Social History Narrative   Usually gets 6-7 hours of sleep per night   4 people living in the home.   Originally from [place] has a bachelor's degree some training in nursing and alternative medicine.   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   Negative tobacco alcohol some caffeine   Gravida 2 para 2 last Pap 2013   Think she had T. 2012   Immunization History  Administered Date(s) Administered   Influenza,inj,Quad PF,6+ Mos 02/04/2013, 05/28/2014, 12/27/2015, 11/25/2017   Influenza-Unspecified 02/11/2015   PFIZER(Purple Top)SARS-COV-2 Vaccination 06/17/2019, 07/08/2019, 02/16/2020   Tdap 04/03/2010     Objective: Vital Signs: There were no vitals taken for this visit.   Physical Exam   Musculoskeletal  Exam: ***  CDAI Exam: CDAI Score: -- Patient Global: --; Provider Global: -- Swollen: --; Tender: -- Joint Exam 05/01/2021   No joint exam has been documented for this visit   There is currently no information documented on the homunculus. Go to the Rheumatology activity and complete the homunculus joint exam.  Investigation: No additional findings.  Imaging: No results found.  Recent Labs: Lab Results  Component Value Date   WBC 9.5 08/23/2020   HGB 14.6 08/23/2020    PLT 244 08/23/2020   NA 134 (L) 08/23/2020   K 3.7 08/23/2020   CL 100 08/23/2020   CO2 24 08/23/2020   GLUCOSE 132 (H) 08/23/2020   BUN 11 08/23/2020   CREATININE 0.97 08/23/2020   BILITOT 0.7 08/23/2020   ALKPHOS 76 08/23/2020   AST 20 08/23/2020   ALT 25 08/23/2020   PROT 7.2 08/23/2020   ALBUMIN 3.6 08/23/2020   CALCIUM 9.2 08/23/2020    Speciality Comments: No specialty comments available.  Procedures:  No procedures performed Allergies: Patient has no known allergies.   Assessment / Plan:     Visit Diagnoses: No diagnosis found.  Orders: No orders of the defined types were placed in this encounter.  No orders of the defined types were placed in this encounter.   Face-to-face time spent with patient was *** minutes. Greater than 50% of time was spent in counseling and coordination of care.  Follow-Up Instructions: No follow-ups on file.   Earnestine Mealing, CMA  Note - This record has been created using Editor, commissioning.  Chart creation errors have been sought, but may not always  have been located. Such creation errors do not reflect on  the standard of medical care.

## 2021-04-26 ENCOUNTER — Telehealth: Payer: Self-pay

## 2021-04-26 NOTE — Telephone Encounter (Signed)
I do not have a license to practice in [place].  I would not be able to do a virtual visit.  She will have to see her primary care physician until she can establish with a rheumatologist.

## 2021-04-26 NOTE — Telephone Encounter (Signed)
Patient advised Dr. Corliss Skains does not have a license to practice in [place].  She would not be able to do a virtual visit.  Patient advised she will have to see her primary care physician until she can establish with a rheumatologist. Patient expressed understanding.

## 2021-04-26 NOTE — Telephone Encounter (Signed)
Patient called stating she has appointment scheduled for Monday, 05/01/21, but has moved to [place].  Patient states she hasn't found a Rheumatologist yet and is asking if Dr. Corliss Skains would be willing to have a virtual appointment.  Patient states she is not having any problems and not on any medications.  Patient states she is available any day / any time.  Please advise.

## 2021-05-01 ENCOUNTER — Ambulatory Visit: Payer: 59 | Admitting: Rheumatology

## 2021-05-01 DIAGNOSIS — Z9289 Personal history of other medical treatment: Secondary | ICD-10-CM

## 2021-05-01 DIAGNOSIS — M7071 Other bursitis of hip, right hip: Secondary | ICD-10-CM

## 2021-05-01 DIAGNOSIS — D6851 Activated protein C resistance: Secondary | ICD-10-CM

## 2021-05-01 DIAGNOSIS — R21 Rash and other nonspecific skin eruption: Secondary | ICD-10-CM

## 2021-05-01 DIAGNOSIS — R700 Elevated erythrocyte sedimentation rate: Secondary | ICD-10-CM

## 2021-05-01 DIAGNOSIS — M791 Myalgia, unspecified site: Secondary | ICD-10-CM

## 2021-05-01 DIAGNOSIS — E782 Mixed hyperlipidemia: Secondary | ICD-10-CM

## 2021-05-01 DIAGNOSIS — M5030 Other cervical disc degeneration, unspecified cervical region: Secondary | ICD-10-CM

## 2021-05-01 DIAGNOSIS — M722 Plantar fascial fibromatosis: Secondary | ICD-10-CM

## 2021-05-01 DIAGNOSIS — M170 Bilateral primary osteoarthritis of knee: Secondary | ICD-10-CM

## 2021-05-01 DIAGNOSIS — Z86718 Personal history of other venous thrombosis and embolism: Secondary | ICD-10-CM

## 2021-05-26 ENCOUNTER — Telehealth: Payer: Self-pay | Admitting: Internal Medicine

## 2021-05-26 DIAGNOSIS — Z9189 Other specified personal risk factors, not elsewhere classified: Secondary | ICD-10-CM

## 2021-05-26 NOTE — Telephone Encounter (Signed)
Pt is calling and would like to know if md would give her an order for cardiac calcium scoring test due to she would like to see if she has calcification of her arteries and does have family history of heart attack. Pt is in [place] and once md give her the approval she will give the office the fax number etc to fax the order to

## 2021-05-29 NOTE — Telephone Encounter (Signed)
Ok to order  ct cardiac scoring score  dx Cv risk assessment  fam hs of Cv disease  HLD  Records show she is on rosuvastatin 10 mg  (please confirm) .

## 2021-05-30 NOTE — Telephone Encounter (Signed)
I spoke with the pt and she stated she will contact the office with fax number so that we can send orders to facility she prefers.

## 2021-09-15 NOTE — Telephone Encounter (Signed)
Error/njr °

## 2021-11-20 IMAGING — CT CT ANGIO CHEST
2 of 7 series · 17 of 46 positions shown · IV contrast (APPLIED)
Comparison: Mammography 06/17/2015.

CLINICAL DATA: Patient tested positive for 3ovidN0 last [REDACTED] ,
reports chest tightness with occasional dry cough and mild SOB , no
fever or chills .

EXAM:
CT ANGIOGRAPHY CHEST WITH CONTRAST
TECHNIQUE: Multidetector CT imaging of the chest was performed using the
standard protocol during bolus administration of intravenous
contrast. Multiplanar CT image reconstructions and MIPs were
obtained to evaluate the vascular anatomy.
CONTRAST:  50mL OMNIPAQUE IOHEXOL 350 MG/ML SOLN

[Series 6: thins · axial · 0.70mm/px · z∈[-17,+207]mm · 14 of 358 slices shown]
[im 19/358  lung]
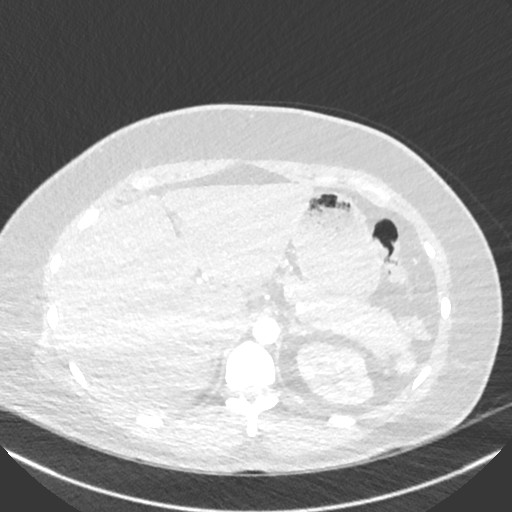
[im 38/358  soft-tissue]
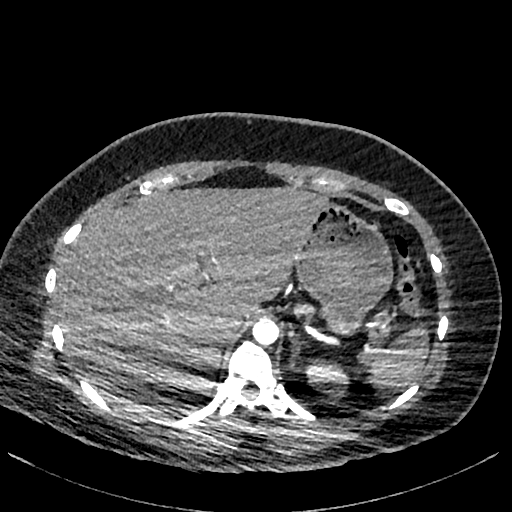
[im 76/358  lung]
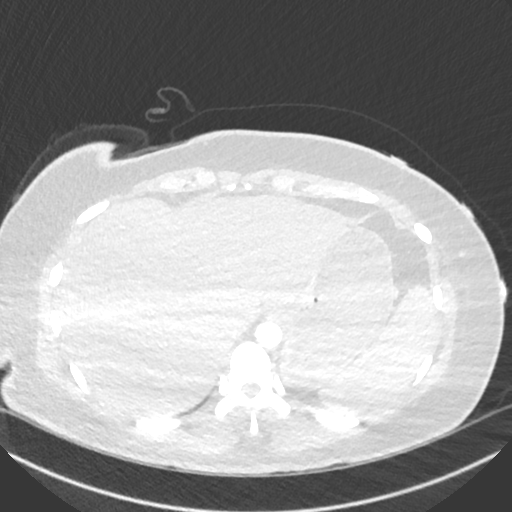
[im 94/358  soft-tissue]
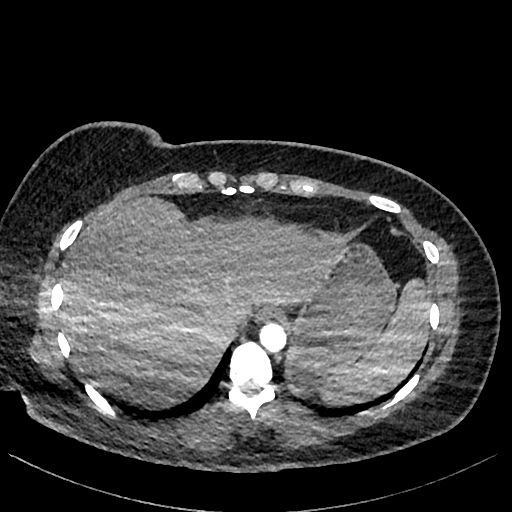
[im 113/358  lung]
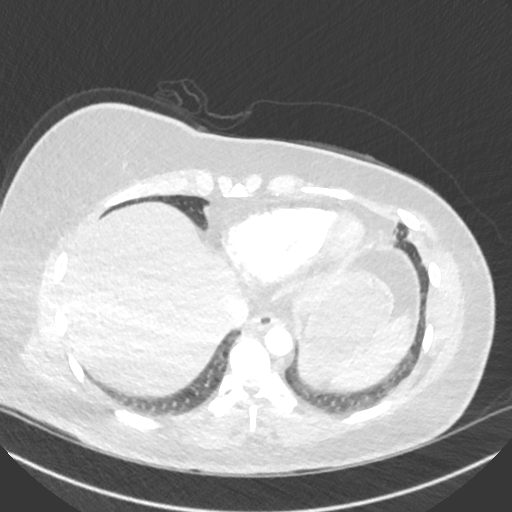
[im 151/358  soft-tissue]
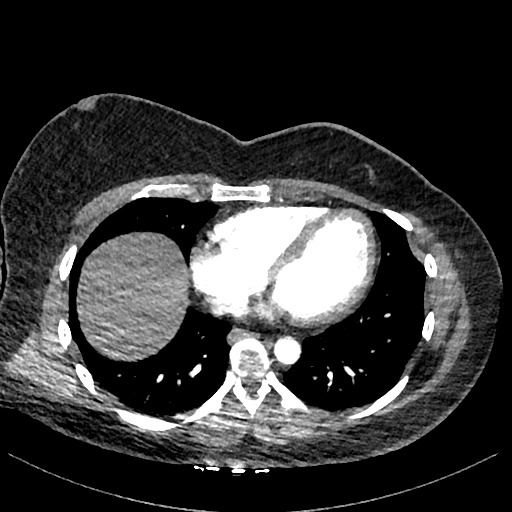
[im 170/358  lung]
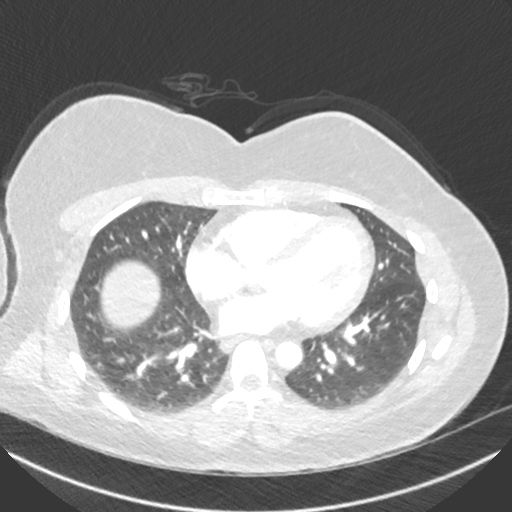
[im 188/358  soft-tissue]
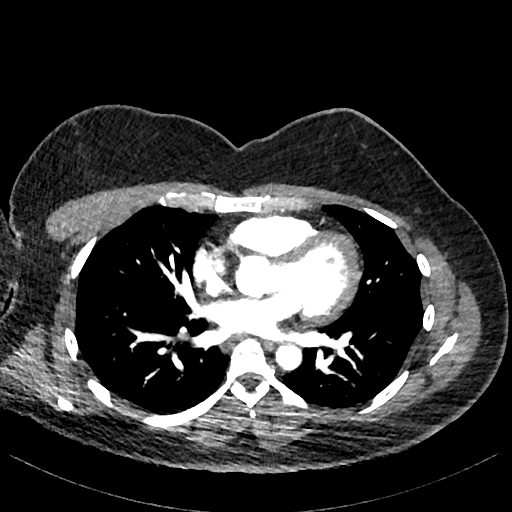
[im 207/358  lung]
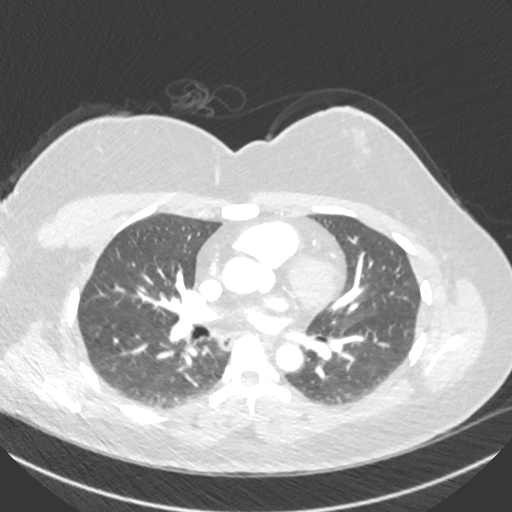
[im 245/358  soft-tissue]
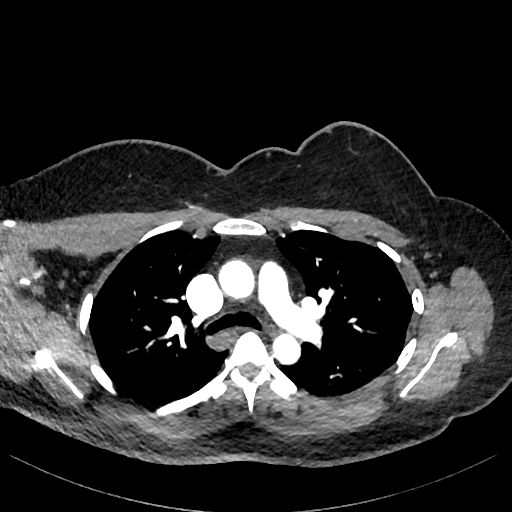
[im 264/358  lung]
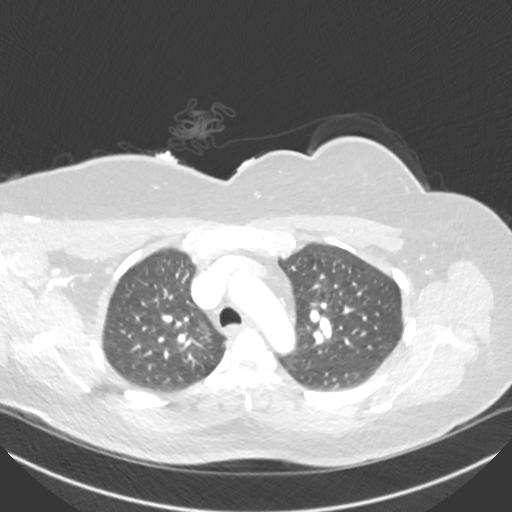
[im 282/358  soft-tissue]
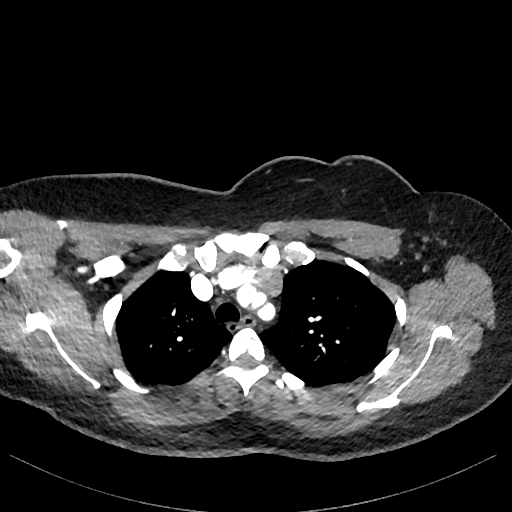
[im 320/358  lung]
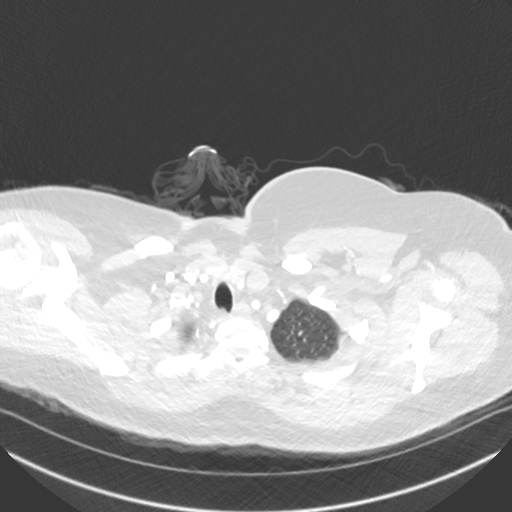
[im 339/358  soft-tissue]
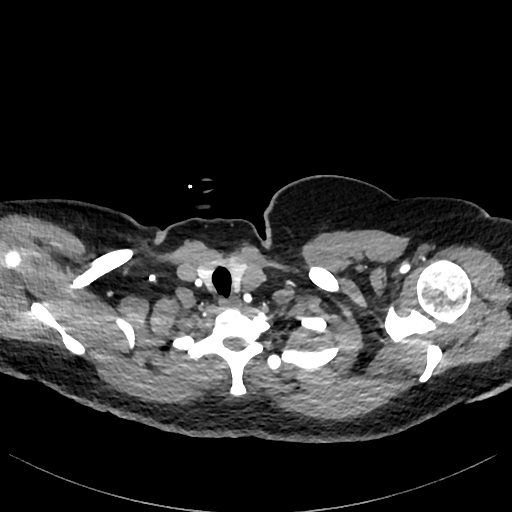

[Series 7: cor · coronal · 0.48mm/px · 3 of 128 slices shown]
[im 32/128  soft-tissue]
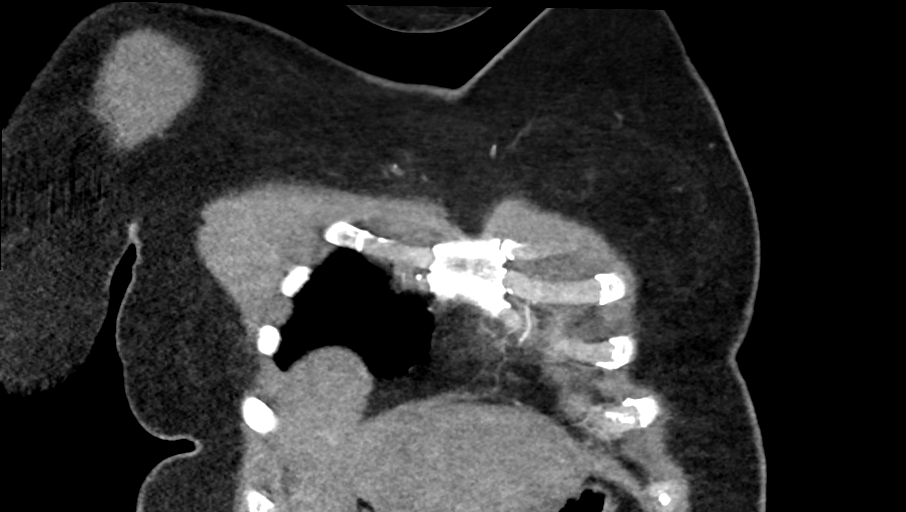
[im 64/128  soft-tissue]
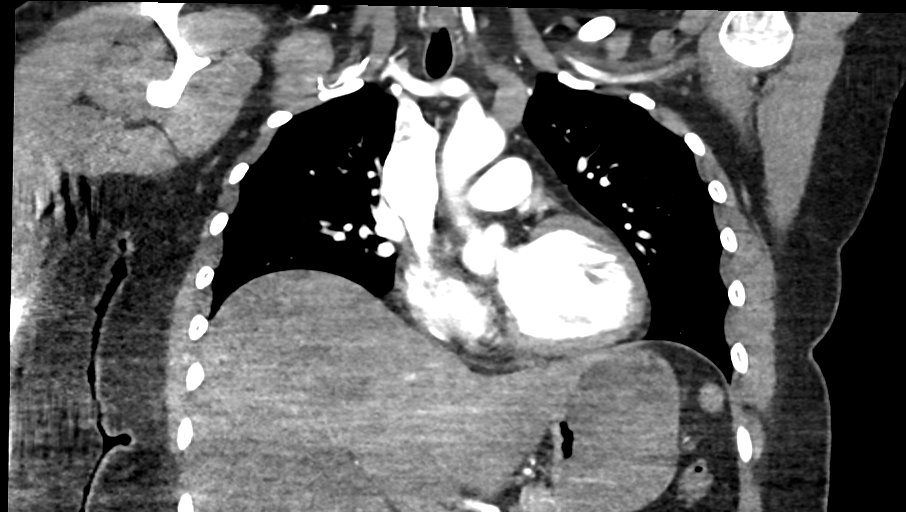
[im 96/128  soft-tissue]
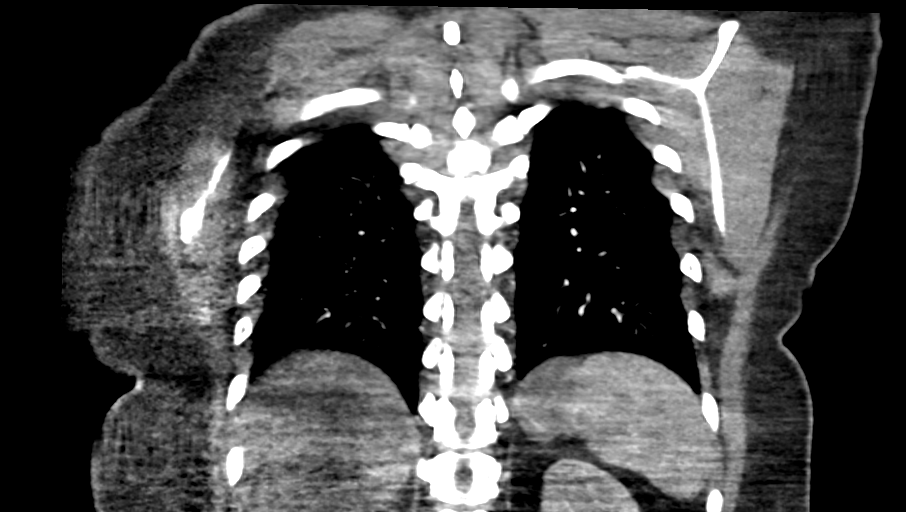

[17 of 46 positions shown; findings below may reference images not displayed]

FINDINGS: Cardiovascular: Satisfactory opacification of the pulmonary arteries
to the segmental level. No evidence of pulmonary embolism. Normal
heart size. No significant pericardial effusion. The thoracic aorta
is normal in caliber. Mild atherosclerotic plaque of the thoracic
aorta. No coronary artery calcifications.

Mediastinum/Nodes: No enlarged mediastinal, hilar, or axillary lymph
nodes. Thyroid gland, trachea, and esophagus demonstrate no
significant findings.

Lungs/Pleura: Expiratory phase of respiration. No focal
consolidation. No pulmonary nodule. No pulmonary mass. No pleural
effusion. No pneumothorax.

Upper Abdomen: No acute abnormality.

Musculoskeletal:

Asymmetric left breast tissue compared to the right.

No suspicious lytic or blastic osseous lesions. No acute displaced
fracture. Multilevel degenerative changes of the spine.

Review of the MIP images confirms the above findings.
IMPRESSION: 1. No pulmonary embolus.
2. No acute intrathoracic abnormality.
3. Asymmetric left breast tissue. Recommend correlation with
mammography.

## 2022-04-09 ENCOUNTER — Encounter: Payer: Self-pay | Admitting: Gastroenterology

## 2022-04-09 DIAGNOSIS — I87002 Postthrombotic syndrome without complications of left lower extremity: Secondary | ICD-10-CM | POA: Insufficient documentation

## 2022-04-09 DIAGNOSIS — E785 Hyperlipidemia, unspecified: Secondary | ICD-10-CM | POA: Insufficient documentation

## 2022-04-09 DIAGNOSIS — O24419 Gestational diabetes mellitus in pregnancy, unspecified control: Secondary | ICD-10-CM | POA: Insufficient documentation

## 2022-04-09 DIAGNOSIS — D6851 Activated protein C resistance: Secondary | ICD-10-CM | POA: Insufficient documentation

## 2022-04-10 ENCOUNTER — Encounter: Payer: Self-pay | Admitting: Gastroenterology

## 2022-04-12 ENCOUNTER — Other Ambulatory Visit: Payer: Self-pay | Admitting: Internal Medicine

## 2022-04-12 DIAGNOSIS — I82402 Acute embolism and thrombosis of unspecified deep veins of left lower extremity: Secondary | ICD-10-CM

## 2022-04-16 ENCOUNTER — Other Ambulatory Visit: Payer: BLUE CROSS/BLUE SHIELD

## 2022-06-20 ENCOUNTER — Other Ambulatory Visit: Payer: Self-pay

## 2022-06-20 ENCOUNTER — Ambulatory Visit: Payer: BLUE CROSS/BLUE SHIELD | Attending: Dermatology | Admitting: Dermatology

## 2022-06-20 VITALS — Ht 65.0 in | Wt 210.0 lb

## 2022-06-20 DIAGNOSIS — R21 Rash and other nonspecific skin eruption: Secondary | ICD-10-CM | POA: Insufficient documentation

## 2022-06-20 NOTE — Progress Notes (Addendum)
Dermatology Progress Note    Chief Complaint   Patient presents with    Skin Problem     Spot of concern        Derm History and Relevant Medical History:  DVT, FVL  ?  HPI:   Eileen Bond is a pleasant 47 y.o. female here for the concern noted below.  ?  Last visit: NPV    Concern today 06/20/22:   Patient reports that she has had a pruritic rash on her left leg present for about 4 years. She reported this rash started around the time she had steroid injections into her knees bilaterally for osteoarthritis. She was worried that the rash was related to this, since she also had blurry vision and other side effects she believes was from the steroid injections at that time.   She has seen numerous dermatologists in other areas for this condition. She reported she had a biopsy about 4 years that says "atopic." She reports she has previously used a variety of topical steroids, including clobetasol, triamcinolone, betamethasone, desonide, hydrocortisone. When she has used a topical steroid she has had some improvement, but it hasn't ever fully controlled symptoms. She is just currently using olive oil.  She didn't every try any tacrolimus, pimecrolimus, or topical antifungals.  She denies any other skin history or rashes, or any rashes anywhere else.  She reports 14 years ago she had a DVT in her left leg during her pregnancy, and she has Factor V Leiden mutation (heterozygote). For 2 years after she was doing Lovenox injections, then she switched to 81 mg aspirin. She didn't have any rashes at that time. She denies any other medical conditions.  She reports in the past she has had an "elevated CRP" but has no history of autoimmune conditions.  ?  Social Hx:  previously lived in [place] and [place], moved here for schools, 2 kids 12 and 14, husband is Art gallery manager.     Physical Exam:   Vitals:    06/20/22 0904   Weight: 95.3 kg (210 lb)   Height: 1.651 m (5\' 5" )     Skin: All of the following were examined, and were  within normal limits, except as noted: Back, Extremities (RUE/LUE), and Extremities (RLE/LLE), Face  -On the left leg there are purple-brown lichenified, scaly plaques    ??    Assessment/Plan:  Rash of left leg:  Favor lichen simplex chronicus. Ddx also includes hypertrophic lichen planus  -punch biopsy today (procedure note below); as above (location)  -Wound care with vaseline/aquaphor and bandage discussed. Wound care instructions provided   -keep area covered for 24 hours, then remove bandage and wash as normal.  Keep wound moist with frequent vaseline/aquaphor thereafter.   -can leave open or cover with bandage to prevent vaseline getting on clothing.  -Treatment based on pathology results  -Patient prefers to be notified of results via:  []  Phone  [x]  MyChart      Return to clinic: 2-3 months    Supervised by Dr. Darol Destine, MD  Dermatology Resident, PGY2    I saw and evaluated the patient. I agree with the resident's/fellow's findings and plan of care as documented. I was present and I participated during the critical and key portions of this procedure, and I was immediately available during the remainder of the procedure.    Ova Freshwater, MD      _____________________________________________________    [facility] Biopsy Procedure Note:   -  Recommend punch biopsy for definitive diagnosis of above  - After informed written consent was obtained, using Hibiclens/alcohol for cleansing/disinfecting and 1% Lidocaine with epinephrine for anesthetic, a 3 mm punch biopsy was used to obtain punch biopsy specimen of the lesion(s). Hemostasis was obtained by pressure and wound was closed with gelfoam. Vaseline ointment was applied to the wound with dressing, and wound care instructions provided. The specimen was labeled and sent to pathology for evaluation. The procedure was well tolerated without complication.    Timeout:  Preprocedure verification conducted prior to time out: Yes   Consent obtained:  yes  Correct Patient (Use 2 identifiers):  Yes   Correct Procedure: Yes   Time out completed at: just prior to procedure  Correct Site:  Yes   Site Marked: Yes

## 2022-06-20 NOTE — Patient Instructions (Signed)
Postoperative Care of the Biopsy site:  Daily Care:  Keep the original dressing on and dry for 24-48 hours.  Remove the dressing and wash with mild soap and water.  Apply a thin layer of vaseline/Aquaphor ointment (If you have steri-strips over the site do not use any ointment.)  Cover the wound with a Band-aid or a piece of Telfa held in place with a Band-Aid or tape.  The wound should be cleaned and the dressing changed once a day for one to two weeks.  It may take 4 to 6 weeks for your biopsy site to fully heal   Pain Management:   Significant pain is unlikely but if you experience discomfort you may use Plain Tylenol (Acetaminophen) or Extra Strength Tylenol   Please follow instructions and recommendations on packaging of this medication   Bleeding:   If you experience bleeding, apply firm pressure with gauze over the area for 15 minutes.    For emergencies after hours or on the weekend call the [facility] office at [phone] and a provider will contact you.    Biopsy results typically take ~2 weeks to return. We will contact you once results return.

## 2022-06-29 ENCOUNTER — Other Ambulatory Visit: Payer: Self-pay

## 2022-06-29 LAB — SURGICAL PATHOLOGY

## 2022-06-29 MED ORDER — BETAMETHASONE DIPROPIONATE AUG 0.05 % EX OINT *A*
TOPICAL_OINTMENT | Freq: Two times a day (BID) | CUTANEOUS | 0 refills | Status: DC
Start: 2022-06-29 — End: 2022-08-29

## 2022-06-29 NOTE — Result Encounter Note (Signed)
Results have been received for workup recently undertaken for:     Patient:  Eileen Bond DOB:  October 14, 1975  The results are as on differential  Our office will communicate these results to patient via MyChart  Earnestine Leys, MD  June 29, 2022 4:57 PM

## 2022-07-27 ENCOUNTER — Other Ambulatory Visit: Payer: Self-pay

## 2022-07-27 ENCOUNTER — Other Ambulatory Visit
Admission: RE | Admit: 2022-07-27 | Discharge: 2022-07-27 | Disposition: A | Discharge status: Home / Self Care | Payer: BLUE CROSS/BLUE SHIELD | Source: Ambulatory Visit | Attending: Internal Medicine | Admitting: Internal Medicine

## 2022-07-27 DIAGNOSIS — Z0000 Encounter for general adult medical examination without abnormal findings: Secondary | ICD-10-CM | POA: Insufficient documentation

## 2022-07-27 LAB — CBC AND DIFFERENTIAL
Baso # K/uL: 0 10*3/uL (ref 0.0–0.2)
Eos # K/uL: 0.1 10*3/uL (ref 0.0–0.5)
Hematocrit: 44 % (ref 34–49)
Hemoglobin: 14.2 g/dL (ref 11.2–16.0)
IMM Granulocytes #: 0.1 10*3/uL — ABNORMAL HIGH (ref 0.0–0.0)
IMM Granulocytes: 0.8 %
Lymph # K/uL: 2.9 10*3/uL (ref 1.0–5.0)
MCV: 95 fL (ref 75–100)
Mono # K/uL: 0.5 10*3/uL (ref 0.1–1.0)
Neut # K/uL: 5.8 10*3/uL (ref 1.5–6.5)
Nucl RBC # K/uL: 0 10*3/uL (ref 0.0–0.0)
Nucl RBC %: 0 /100 WBC (ref 0.0–0.2)
Platelets: 239 10*3/uL (ref 150–450)
RBC: 4.6 MIL/uL (ref 4.0–5.5)
RDW: 12.1 % (ref 0.0–15.0)
Seg Neut %: 61.2 %
WBC: 9.5 10*3/uL (ref 3.5–11.0)

## 2022-07-27 LAB — LIPID PANEL
Chol/HDL Ratio: 4
Cholesterol: 249 mg/dL — AB
HDL: 62 mg/dL — ABNORMAL HIGH (ref 40–60)
LDL Calculated: 169 mg/dL — AB
Non HDL Cholesterol: 187 mg/dL
Triglycerides: 89 mg/dL

## 2022-07-27 LAB — HEMOGLOBIN A1C: Hemoglobin A1C: 6.4 % — ABNORMAL HIGH

## 2022-07-27 LAB — COMPREHENSIVE METABOLIC PANEL
ALT: 41 U/L — ABNORMAL HIGH (ref 0–35)
AST: 30 U/L (ref 0–35)
Albumin: 4.2 g/dL (ref 3.5–5.2)
Alk Phos: 95 U/L (ref 35–105)
Anion Gap: 11 (ref 7–16)
Bilirubin,Total: 1 mg/dL (ref 0.0–1.2)
CO2: 27 mmol/L (ref 20–28)
Calcium: 9.5 mg/dL (ref 8.6–10.2)
Chloride: 101 mmol/L (ref 96–108)
Creatinine: 0.89 mg/dL (ref 0.51–0.95)
Glucose: 99 mg/dL (ref 60–99)
Lab: 9 mg/dL (ref 6–20)
Potassium: 3.9 mmol/L (ref 3.3–5.1)
Sodium: 139 mmol/L (ref 133–145)
Total Protein: 7.4 g/dL (ref 6.3–7.7)
eGFR BY CREAT: 80 *

## 2022-07-27 LAB — TSH: TSH: 0.94 u[IU]/mL (ref 0.27–4.20)

## 2022-07-27 LAB — URINE MICROSCOPIC (IQ200): Hyaline Casts,UA: NONE SEEN /lpf (ref 0–5)

## 2022-07-27 LAB — URINALYSIS REFLEX TO CULTURE
Blood,UA: NEGATIVE
Glucose,UA: NEGATIVE
Ketones, UA: NEGATIVE
Leuk Esterase,UA: NEGATIVE
Nitrite,UA: NEGATIVE
Specific Gravity,UA: 1.019 (ref 1.002–1.030)
pH,UA: 6.5 (ref 5.0–8.0)

## 2022-07-27 LAB — TIBC
Iron: 95 ug/dL (ref 34–165)
TIBC: 379 ug/dL (ref 250–450)
Transferrin Saturation: 25 % (ref 15–50)

## 2022-07-27 LAB — VITAMIN B12: Vitamin B12: 600 pg/mL (ref 232–1245)

## 2022-07-27 LAB — FERRITIN: Ferritin: 306 ng/mL — ABNORMAL HIGH (ref 10–120)

## 2022-07-27 LAB — T4, FREE: Free T4: 1.2 ng/dL (ref 0.9–1.7)

## 2022-07-27 LAB — VITAMIN D: 25-OH Vit Total: 23 ng/mL — ABNORMAL LOW (ref 30–60)

## 2022-07-29 ENCOUNTER — Encounter: Payer: Self-pay | Admitting: Dermatology

## 2022-07-31 ENCOUNTER — Other Ambulatory Visit: Payer: Self-pay | Admitting: Gastroenterology

## 2022-07-31 DIAGNOSIS — E559 Vitamin D deficiency, unspecified: Secondary | ICD-10-CM | POA: Insufficient documentation

## 2022-07-31 DIAGNOSIS — E119 Type 2 diabetes mellitus without complications: Secondary | ICD-10-CM | POA: Insufficient documentation

## 2022-07-31 DIAGNOSIS — E669 Obesity, unspecified: Secondary | ICD-10-CM | POA: Insufficient documentation

## 2022-08-15 ENCOUNTER — Ambulatory Visit
Admission: RE | Admit: 2022-08-15 | Discharge: 2022-08-15 | Disposition: A | Discharge status: Home / Self Care | Payer: BLUE CROSS/BLUE SHIELD | Source: Ambulatory Visit | Attending: Primary Care | Admitting: Primary Care

## 2022-08-15 ENCOUNTER — Other Ambulatory Visit: Payer: Self-pay | Admitting: Primary Care

## 2022-08-15 ENCOUNTER — Other Ambulatory Visit: Payer: Self-pay

## 2022-08-15 ENCOUNTER — Ambulatory Visit: Payer: BLUE CROSS/BLUE SHIELD | Admitting: Radiology

## 2022-08-15 DIAGNOSIS — M25561 Pain in right knee: Secondary | ICD-10-CM

## 2022-08-15 DIAGNOSIS — M25562 Pain in left knee: Secondary | ICD-10-CM | POA: Insufficient documentation

## 2022-08-15 DIAGNOSIS — M1712 Unilateral primary osteoarthritis, left knee: Secondary | ICD-10-CM | POA: Insufficient documentation

## 2022-08-16 NOTE — Progress Notes (Signed)
Hematology New Patient Pathway     Aidaly Loor  Age: 47 y.o.  DOB: 1975-04-05  MRN: [id number]  [address]  [place] [address]  Preferred language: ENGLISH      REGISTRATION:    Date of referral: 04/09/2022   Reason for Referral: Establish Care   Type of referral: External   Referring provider: PC Dr. Rogue Jury   Reason for referral: FVL, DVT   Assessment:   04/09/22 PCP Ref doc  04/10/22 PCP OV note MEDIA     Labs: RESULTS REVIEW    No FVL testing found     Imaging: CHART REVIEW Image      Nurse Navigator encounter type: Chart Review      Current Outpatient Medications on File Prior to Visit:  betamethasone diprop augmented (DIPROLENE-AF) 0.05 % ointment, Apply topically 2 times daily to the following areas: rash on leg, Disp: 50 g, Rfl: 0    No current facility-administered medications on file prior to visit.      No past medical history on file.    Time spent on this encounter (in minutes): 8

## 2022-08-20 ENCOUNTER — Telehealth: Payer: Self-pay

## 2022-08-20 NOTE — Telephone Encounter (Signed)
Called pt and rescheduled with Dermatology Ova Freshwater, MD) on Wednesday   08/29/2022 at 1:15 PM, [facility].

## 2022-08-23 ENCOUNTER — Ambulatory Visit: Payer: BLUE CROSS/BLUE SHIELD | Admitting: Dermatology

## 2022-08-27 NOTE — Progress Notes (Unsigned)
Orthopaedic Surgery Clinic Note       Name: Eileen Bond  MRN: [id number]  Date: 08/27/2022     CC: Bilateral knee pain      History       Eileen Bond is a 47 y.o. that is being seen for evaluation of bilateral knee pain.    This patient states that she has been experiencing bilateral knee pain for approximately the last several years.  But over the last month this patient states that her right knee has become more painful.  She notes that she was doing housework approximately 1 month ago when she turned and heard a pop in her knee and she was having pain and was limping for approximately a couple weeks afterwards.    This patient characterizes her pain as a fairly constant aching pain in both of her knees that is worsened with weightbearing and with bending. This patient states that her knee pain has prevented her from being able to fully pray and prostration posing.  Which is extremely frustrating for her as this is a major part of her religious practices.  Additionally, she states that weather changes seem to increase the pain that she experiences in her knees.  She notes that she has not been able to bend her knees fully for the last several years.    She states that she has previously tried corticosteroid injections for her knees which caused her some severe side effects causing her to have blurry vision so she has never repeated these. Additionally, this patient states that she has also tried gel injections which were moderately helpful lasting for approximately 3 months time.  This patient has also had PRP injections, which the last one was given in 2017 in her right knee which has remained significantly beneficial until about 3 months ago.  These injections have given her years of relief.  Patient has also tried PT in the past but has not done any formal PT recently.    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    Answers submitted by the patient for this visit:  Right knee (Submitted on 08/30/2022)  Chief Complaint: RIGHT KNEE PAIN HPI  What is your goal for today's visit?: checkup  Date of onset: : 06/10/2022  Was this the result of an injury?: No  What is your pain level?: 4/10    Past medical history, past surgical history, medications, allergies, family history, social history, and review of systems were reviewed today and have been documented separately in this encounter.        Physical Exam       General: She is in no acute distress.  She  is alert and oriented x 3.   Respiratory: adequate effort  Cardiovascular: extremities perfused  Abdomen: non distended  Skin: no rashes      Bilateral Knee:   Skin: no skin lesions, rashes, or discoloration  Prior scars: none   Effusion: Hard to assess due to body habitus  Alignment: valgus, Correctable: Yes  Tenderness: None on the right knee and medial lateral joint line on the left knee  ROM: Extension: 0 degrees (full extension), no active extension lag, Flexion: 90 degrees  There is pain with range of motion of the knee as the above AROM to 90 degrees  Extension: None varus/valgus laxity, firm endpoints  Flexion (30 degrees): None varus/valgus laxity, firm endpoints  Special testing: Perform straight leg raise.  Negative McMurray's bilaterally.  Negative anterior and posterior drawer.    Neuro:  Motor: quadriceps, dorsiflexion, plantar flexion, and intact  Sensation: Sensation is intact to light touch in saphenous/sural/tibial/deep and superficial peroneal nerve distributions   Circulation: Dorsalis pedis pulses palpable and symmetric bilaterally, toes demonstrate brisk capillary refill.   Peripheral vascular skin changes/ulcers: No      Labs         Lab results: 07/27/22  1315   Sodium 139   Potassium 3.9   Chloride 101   CO2 27   UN 9   Creatinine 0.89   Glucose 99   Calcium 9.5            Lab results: 07/27/22  1315   WBC 9.5   Hemoglobin 14.2    Hematocrit 44   RBC 4.6   Platelets 239        Hemoglobin A1C   Date Value Ref Range Status   07/27/2022 6.4 (H) % Final     Comment:     Ref Range <=5.6  HbA1c values of 5.7-6.4% indicate an increased risk for developing  diabetes mellitus.  HbA1c values greater than or equal to 6.5% are diagnostic of  diabetes mellitus.  For diagnosis of diabetes in individuals without unequivocal  hyperglycemia, results should be confirmed by repeat testing.               Imaging     XRAY: 08/15/22 bilateral knee radiographs were personally reviewed and interpreted and demonstrate severe medial compartment degenerative osteoarthritis as evidenced by bone on bone articulation, subchondral sclerosis and peripheral osteophyte formation. Additionally moderate lateral and patellofemoral degenerative osteoarthritic changes noted as well.      Assessment/Plan       Eileen Bond is a 47 y.o. female with:        ICD-10-CM ICD-9-CM    1. Bilateral primary osteoarthritis of knee  M17.0 715.16           I discussed the diagnosis and treatment plan with the patient.   - Imaging reviewed today with the patient at bedside.    This is patient's clinical diagnosis of bilateral severe primary osteoarthritis and associated treatment options.  At this time, this patient has tried many oral and topical anti-inflammatory medications, topical creams in addition to corticosteroid injections, gel injections as well as PRP injections.  Out of all of this, she states that PRP injections have been most beneficial with regards to relieving her knee pain.  Her last injection was in 2017 and she is experience lasting relief until approximately 3 months ago.  She would like to proceed forward with repeat PRP injections into both knees.  Patient was given a formal referral to see one of our partners Dr. Kathryne Sharper for these injections in addition to a formal therapy for aquatic therapy.  Lastly, she was given additional health resources along with bracing advice  (medial unloader brace).  She will follow-up in clinic in 3 months, no repeat x-rays needed.  She is always welcome to call in the meantime should she need anything.    The patient expressed appreciation and understanding with the above stated plan of care.     All questions invited and answered to patient's satisfaction.     Follow up   As needed      Joan Flores, NP  Orthopaedic Surgery  08/27/2022  11:42 PM

## 2022-08-29 ENCOUNTER — Other Ambulatory Visit: Payer: Self-pay

## 2022-08-29 ENCOUNTER — Ambulatory Visit: Payer: BLUE CROSS/BLUE SHIELD | Attending: Dermatology | Admitting: Dermatology

## 2022-08-29 VITALS — Wt 210.0 lb

## 2022-08-29 DIAGNOSIS — L439 Lichen planus, unspecified: Secondary | ICD-10-CM

## 2022-08-29 MED ORDER — TACROLIMUS 0.1 % EX OINT *I*
TOPICAL_OINTMENT | Freq: Two times a day (BID) | CUTANEOUS | 3 refills | Status: DC
Start: 2022-08-29 — End: 2023-03-19

## 2022-08-29 MED ORDER — BETAMETHASONE DIPROPIONATE AUG 0.05 % EX OINT *A*
TOPICAL_OINTMENT | Freq: Two times a day (BID) | CUTANEOUS | 2 refills | Status: AC
Start: 2022-08-29 — End: ?

## 2022-08-29 NOTE — Progress Notes (Addendum)
Dermatology Progress Note    Chief Complaint   Patient presents with    Follow-up     Spot of concern       Derm History and Relevant Medical History:  DVT, FVL  ?  HPI:   Eileen Bond is a pleasant 47 y.o. female here for the concern noted below.  ?  Last visit: 06/2022 for an itchy chronic rash on the left leg, biopsy taken and consistent with partially treated lichen planus, started on betamethasone.    Concern today 08/29/22    Patient reports that she has been using the betamethasone twice daily, noticing some improvement where it is less raised and swollen and it is less itchy with the betamethasone.    She reports she has previously used a variety of topical steroids, including clobetasol, triamcinolone, betamethasone, desonide, hydrocortisone.    Patient reports she is very bothered by the appearance of the rash.    Started a statin three weeks ago, only takes a statin, metformin, and pain medication.  ?  Social Hx:  previously lived in [place] and [place], moved here for schools, 2 kids 12 and 14, husband is Art gallery manager.     Physical Exam:   Vitals:    08/29/22 1311   Weight: 95.3 kg (210 lb)     Focused exam of left lower leg  -On the left leg there are purple-brown lichenified, scaly plaques - improved from prior  ??    Assessment/Plan:    Lichen planus, left leg  Chronic >4 years, refractory to multiple topical steroids, has seen previous dermatologists before. Medication reviewed without clear trigger.  -On the weekdays, start tacrolimus 0.1% ointment 2x daily as needed to the affected areas on the leg   -The medication may cause burning with initial application, but this will stop with continued use  -On the weekends, start betamethasone dipropionate ointment twice daily under occlusion to the affected areas on the leg   -Do not use on face, underarms, or groin   -Side effects: lightening or thinning of the skin, especially if used on areas where you don't have rash   -Also discussed that phototherapy  can be helpful due to anti-inflammatory properties of sunlight--recommend to patient to trial exposing the affected area of the lower leg to sunlight this summer to monitor for improvement  -Start over the counter CereVe SA 1-2x daily as needed to the affected area     Return to clinic: 3 months    Patient seen and examined with Dr. Doristine Locks.    Neena Rhymes, MD  Dermatology Resident PGY-3    I saw and evaluated the patient. I agree with the resident's/fellow's findings and plan of care as documented.   Ova Freshwater, MD

## 2022-08-29 NOTE — Patient Instructions (Signed)
-  On the weekdays, start tacrolimus 0.1% ointment 2x daily as needed to the affected areas on the leg   -The medication may cause burning with initial application, but this will stop with continued use  -On the weekends, start betamethasone dipropionate ointment twice daily under occlusion to the affected areas on the leg   -Do not use on face, underarms, or groin   -Side effects: lightening or thinning of the skin, especially if used on areas where you don't have rash   -Also discussed that phototherapy can be helpful due to anti-inflammatory properties of sunlight--recommend to patient to trial exposing the affected area of the lower leg to sunlight this summer to monitor for improvement  -Start over the counter CereVe SA 1-2x daily as needed to the affected area

## 2022-08-30 ENCOUNTER — Encounter: Payer: Self-pay | Admitting: Orthopaedic Surgery

## 2022-08-30 ENCOUNTER — Ambulatory Visit: Payer: BLUE CROSS/BLUE SHIELD | Attending: Orthopaedic Surgery | Admitting: Orthopaedic Surgery

## 2022-08-30 ENCOUNTER — Other Ambulatory Visit: Payer: Self-pay

## 2022-08-30 VITALS — Ht 65.0 in | Wt 210.0 lb

## 2022-08-30 DIAGNOSIS — M170 Bilateral primary osteoarthritis of knee: Secondary | ICD-10-CM

## 2022-08-30 NOTE — Patient Instructions (Addendum)
Health related books/podcasts    Why we sleep by Blain Pais    2. How Not to Die. [facility]. Food as medicine    3.  [employer] podcasts, books, blog- [employer], longevity    4. [employer], Odis Hollingshead PhD, Adalberto Ill. Food as medicine       Brace: [email or web address].com unloader brace    [email or web address]

## 2022-09-25 ENCOUNTER — Other Ambulatory Visit: Payer: Self-pay

## 2022-09-25 ENCOUNTER — Ambulatory Visit: Payer: BLUE CROSS/BLUE SHIELD | Admitting: Physical Therapy

## 2022-09-25 DIAGNOSIS — G8929 Other chronic pain: Secondary | ICD-10-CM | POA: Insufficient documentation

## 2022-09-25 DIAGNOSIS — M25562 Pain in left knee: Secondary | ICD-10-CM | POA: Insufficient documentation

## 2022-09-25 DIAGNOSIS — M25561 Pain in right knee: Secondary | ICD-10-CM | POA: Insufficient documentation

## 2022-09-25 NOTE — Progress Notes (Signed)
Sent via: eRecord EMR INBASKET    Physician attestation for [facility] Plan of Care: Physician/NP/PA:  Joan Flores, NP  Per signature, I have reviewed and agree with the documented plan of care.    ___________________________________________________________    Please sign this Medicare plan of care for outpatient therapy treatment as required by Medicare. If you have any questions, please contact us at [phone]. We appreciate your prompt attention to this request.    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, Stage manager, Gardening/Yard Work, Presenter, broadcasting, and Sports  Sport(s): NONE  Diagnostic tests: Per report, reviewed, X-ray    Symptom location: Medial and Anterior, bilateral  Relevant symptoms:  Pain , Decreased ROM, Decreased strength  Symptom frequency: Persistent  Symptoms worsen with: Kneeling, Stairs, Weight bearing, Standing, Walking  Symptoms improve with: Medication  Assistive device:  none  Patient's goals for therapy: Perform ambulation at safe level with or without assistive device to promote safe community activity/ social function (shopping, attendance at appointments/events), Reduce pain, Achieve independence with home program for self care / condition management, Return to sports / activities    ROM/Strength    HIP LEFT RIGHT STRENGTH    PROM AROM PROM AROM Left Right   Flexion     4- 3+   Extension         Abduction     4- 4   IR         ER         Adduction                        KNEE LEFT RIGHT STRENGTH    PROM AROM PROM AROM Left Right   Flexion  117  110  4- 3+   Extension  2 HE  2 HE 4 4-                  ANKLE LEFT RIGHT STRENGTH    PROM AROM PROM AROM Left Right   DF (knee ext)         DF (knee flex)         PF         INV         EV             LE Flexibility  Quadricep:  Fair  Hamstring:  Fair     Special Tests:   Spine NA   Hip NA   Knee Valgus,   N/T  Varus,  N/T  Lachman,  N/T  McMurray,  N/T   Ankle NA      30 sec sit to stand Test: 10 reps      Functional:  Stand more than 20 minutes, -  has difficulty  to perform.  Sit more than 20 minutes - able to perform.  Walk more than 20 minutes -  has difficulty  to perform.  Ascend stairs with reciprocal gait -  has difficulty  to perform.  Descend stairs with reciprocal gait - able to perform.  Attain prayer position 5 times a day for a total time of 7 minutes- unable to perform    Assessment:    Findings consistent with 47 y.o. female with Chronic Bilateral Knee pain with pain, ROM limitations, strength limitations, functional limitations. Patient would benefit from skilled aquatic therapy to address identified impairments, functional limitations, and pain. Patient currently unable to tolerate land-based intervention and would benefit from therapy in the aquatic environment for greater mobility, decreased joint compression and reduced weight bearing forces. Will transition patient to land-based therapy or independent community program as appropriate.    Personal factors affecting treatment/recovery:  none identified  Comorbidities affecting treatment/recovery:  none noted  Clinical presentation:  stable  Patient complexity:    low level as indicated by above stability of condition, personal factors, environmental factors and comorbidities in addition to patient symptom presentation and impairments found on physical exam.    Contraindications to Aquatic Therapy Reviewed: Yes    Contagious water or air-borne infection/disease  Open wounds/incisions  Bowel and/or bladder incontinence  Diarrhea and/or vomiting    Infection/fever      Short Term Goals: (3 week(s)): Decrease c/o max pain to < 4/10 and Minimal assistance with HEP/ education concepts  Long Term Goals: (6 week(s)): Pain/Sx 0 - minimal, ROM/ flexibility WNL , Restoration of functional strength, Independent with HEP/education , Functional return to ADLs / activities without limitations  Treatment Plan:   Options / plan reviewed with patient:  Yes  Freq 1 times per week for 6 week(s)    Treatment plan inclusive of:   Exercise: Aquatic Physical Therapy Including AROM, Stretching, Strengthening, Progressive Resistive, Aerobic exercise   Functional: Functional rehab, Endurance training, Self-care, Home management      Thank you for referring this patient to [facility] Orthopedic Sports and Spine Rehabilitation.  Lowella Petties, PT, DPT    LE 3 x 10   STS 3 x 10   Step up 3 x 10   Heel slide 30x                                      Treatment Start Time 9:30   Treatment End Time 10:15   Total Minutes of treatment 45       Total Non-Treatment time (rest)    Total Service Based min of treatment 25   Total Time-Based minutes of treatment 20           Service-Based Procedures/ Modalities    Evaluation 25   Re-evaluation    Total Service Based Treatment 1       Time-Based Procedures / Modalities    Aquatic Therapy 20   Therapeutic ex    Neuromuscular Re-ed    Total Time-Based Treatment 1          POC DATE: 09/25/22

## 2022-10-02 ENCOUNTER — Ambulatory Visit: Payer: BLUE CROSS/BLUE SHIELD

## 2022-10-02 ENCOUNTER — Other Ambulatory Visit: Payer: Self-pay

## 2022-10-02 DIAGNOSIS — G8929 Other chronic pain: Secondary | ICD-10-CM | POA: Insufficient documentation

## 2022-10-02 DIAGNOSIS — M25561 Pain in right knee: Secondary | ICD-10-CM | POA: Insufficient documentation

## 2022-10-02 DIAGNOSIS — M25562 Pain in left knee: Secondary | ICD-10-CM | POA: Insufficient documentation

## 2022-10-02 NOTE — Progress Notes (Signed)
[facility] Orthopedic Sports/Spine Rehabilitation  PT Treatment Note      Name: Cyann Gose  DOB: March 12, 1976  Referring Physician: Joan Flores, NP  Diagnosis:   1. Bilateral chronic knee pain            *Aquatic Therapy Patient Agreement reviewed and signed this date.      Subjective:  Pain Assessment: 5/10  Patient reports that she was very active as a child however she had a blood clot after having her son which caused become fairly weak. She notes that she has been having a difficult time achieving flex/ext ROM and this causes some difficulty with religious posturing while praying.  *Please warn patient PRIOR to a man walking into the room. Patient may want to don her hajeb.      Objective:  Strength - Therapeutic Exercises per flowsheet  Function: -  Increased pain with sitting/kneeling and especially praying  Education:  Verbal cues for ther ex, Gait training, Joint ed concepts    Objective      Water Walking (Fwd/Bkwd) 8 min Combo   Side Stepping 5 min   Calf Raises x20   Standing Calf Stretch 3 x 20" ea B   Mini Squats x20   Hip Program (Flex, Ext, Abd) x15 ea B   SLB 3 x 20" B   HS Curls -->   Knee Flexion Stretch @ Stairs 5 x 10" B   HS Stretch @ Stairs 3 x 20" B   Noodle Pushdowns x20 B   March Walking -->   Step Ups -->       Deep Well    Bicycle 5 min       Treatment:  Aquatic therapy    Assessment:   Patient tolerated her first aquatic session well.       Plan of Care:  Continue per Plan of care -  As written; Patient would benefit from skilled rehabilitation services to address the above impairments to restore functional capacity.    Thank you for referring this patient to [facility] Orthopaedics - Sports and Spine Rehabilitation    Francene Finders, ATC,PTA              Treatment Start Time 7:30 am   Treatment End Time 8:20 am   Total Minutes of treatment 50 min         Total Non-Treatment time (rest)     Total Service Based min of treatment    Total Time-Based minutes of  treatment 50 min               Service-Based Procedures/ Modalities     Evaluation    Re-evaluation     Total Service Based Treatment          Time-Based Procedures / Modalities     Aquatic Therapy 50 min   Therapeutic ex     Neuromuscular Re-ed     Total Time-Based Treatment 3              POC DATE: 09/25/22

## 2022-10-09 ENCOUNTER — Other Ambulatory Visit: Payer: Self-pay

## 2022-10-09 ENCOUNTER — Ambulatory Visit: Payer: BLUE CROSS/BLUE SHIELD

## 2022-10-09 DIAGNOSIS — M25562 Pain in left knee: Secondary | ICD-10-CM

## 2022-10-09 NOTE — Progress Notes (Signed)
[facility] Orthopedic Sports/Spine Rehabilitation  PT Treatment Note      Name: Eileen Bond  DOB: [birth year]  Referring Physician: Joan Flores, NP  Diagnosis:   1. Bilateral chronic knee pain            Subjective:  Pain Assessment: 2/10  Patient reports that she was sore after her first session but no too bad (more exercise soreness than pain). She also notes that she recently tripped down the stairs causing her to land hard on her left LE. Since then, her left knee has been more painful.     Objective:  Strength - Therapeutic Exercises per flowsheet  Function: -  Increased pain with sitting/kneeling and especially praying  Education:  Verbal cues for ther ex, Gait training, Joint ed concepts    Objective      Water Walking (Fwd/Bkwd) 8 min Combo   Side Stepping 5 min   Calf Raises x30   Standing Calf Stretch 3 x 20" ea B   Mini Squats x20   Hip Program (Flex, Ext, Abd) x20 ea B   SLB 3 x 20" B   HS Curls x20 B   Knee Flexion Stretch @ Stairs 5 x 10" B   HS Stretch @ Stairs 3 x 30" B   Noodle Pushdowns x20 B   March Walking 3 min   Step Ups -->       Deep Well    Bicycle 5 min       Treatment:  Aquatic therapy    Assessment:   Patient tolerated aquatic session well. Added HS Curls and March Walking to program today.       Plan of Care:  Continue per Plan of care -  As written; Patient would benefit from skilled rehabilitation services to address the above impairments to restore functional capacity.    Thank you for referring this patient to [facility] Orthopaedics - Sports and Spine Rehabilitation    Francene Finders, ATC,PTA              Treatment Start Time 7:40 am   Treatment End Time 8:40am   Total Minutes of treatment 50 min         Total Non-Treatment time (rest)     Total Service Based min of treatment    Total Time-Based minutes of treatment 50 min               Service-Based Procedures/ Modalities     Evaluation    Re-evaluation     Total Service Based Treatment          Time-Based  Procedures / Modalities     Aquatic Therapy 50 min   Therapeutic ex     Neuromuscular Re-ed     Total Time-Based Treatment 3              POC DATE: 09/25/22

## 2022-10-11 ENCOUNTER — Ambulatory Visit: Payer: BLUE CROSS/BLUE SHIELD | Admitting: Physical Therapy

## 2022-10-16 ENCOUNTER — Ambulatory Visit: Payer: BLUE CROSS/BLUE SHIELD

## 2022-10-18 ENCOUNTER — Ambulatory Visit: Payer: BLUE CROSS/BLUE SHIELD

## 2022-10-23 ENCOUNTER — Other Ambulatory Visit: Payer: Self-pay

## 2022-10-23 ENCOUNTER — Ambulatory Visit: Payer: BLUE CROSS/BLUE SHIELD

## 2022-10-23 DIAGNOSIS — M25562 Pain in left knee: Secondary | ICD-10-CM

## 2022-10-23 NOTE — Progress Notes (Signed)
[facility] Orthopedic Sports/Spine Rehabilitation  PT Treatment Note      Name: Eileen Bond  DOB: [birth year]  Referring Physician: Joan Flores, NP  Diagnosis:   1. Bilateral chronic knee pain            Subjective:  Pain Assessment: 4-5/10  Patient reports that her knees feel okay today but she notes some significant total body pain today.     Objective:  Strength - Therapeutic Exercises per flowsheet  Function: -  Increased pain with sitting/kneeling and especially praying  Education:  Verbal cues for ther ex, Gait training, Joint ed concepts    Objective      Water Walking (Fwd/Bkwd) 8 min Combo   Side Stepping 5 min   Calf Raises x30   Standing Calf Stretch 3 x 20" ea B   Mini Squats x20   Hip Program (Flex, Ext, Abd) x20 ea B   SLB 3 x 20" B   HS Curls x20 B   Knee Flexion Stretch @ Stairs 5 x 10" B   HS Stretch @ Stairs 3 x 30" B   Noodle Pushdowns x20 B   March Walking 3 min   Step Ups -->       Deep Well    Bicycle 5 min       Treatment:  Aquatic therapy    Assessment:   Patient tolerated aquatic session well.        Plan of Care:  Continue per Plan of care -  As written; Patient would benefit from skilled rehabilitation services to address the above impairments to restore functional capacity.    Thank you for referring this patient to [facility] Orthopaedics - Sports and Spine Rehabilitation    Francene Finders, ATC,PTA              Treatment Start Time 7:45 am   Treatment End Time 8:35am   Total Minutes of treatment 50 min         Total Non-Treatment time (rest)     Total Service Based min of treatment    Total Time-Based minutes of treatment 50 min               Service-Based Procedures/ Modalities     Evaluation    Re-evaluation     Total Service Based Treatment          Time-Based Procedures / Modalities     Aquatic Therapy 50 min   Therapeutic ex     Neuromuscular Re-ed     Total Time-Based Treatment 3              POC DATE: 09/25/22

## 2022-10-24 ENCOUNTER — Ambulatory Visit: Payer: BLUE CROSS/BLUE SHIELD | Admitting: Physical Therapy

## 2022-10-30 ENCOUNTER — Ambulatory Visit: Payer: BLUE CROSS/BLUE SHIELD

## 2022-10-30 ENCOUNTER — Other Ambulatory Visit: Payer: Self-pay

## 2022-10-30 DIAGNOSIS — M25561 Pain in right knee: Secondary | ICD-10-CM

## 2022-10-30 NOTE — Progress Notes (Signed)
[facility] Orthopedic Sports/Spine Rehabilitation  PT Treatment Note      Name: Eileen Bond  DOB: Sep 29, 1975  Referring Physician: Joan Flores, NP  Diagnosis:   1. Bilateral chronic knee pain            Subjective:  Pain Assessment: 3/10  Patient reports that she has been experiencing a lot of upper trap and cervical pain over the past couple weeks. Overall, her knees are feeling very good.     Objective:  Strength - Therapeutic Exercises per flowsheet  Function: -  Increased pain with sitting/kneeling and especially praying  Education:  Verbal cues for ther ex, Gait training, Joint ed concepts    Objective      Water Walking (Fwd/Bkwd) 8 min Combo   Side Stepping 5 min   Calf Raises x30   Standing Calf Stretch 3 x 20" ea B   Mini Squats x20   Hip Program (Flex, Ext, Abd) x20 ea B   SLB 3 x 20" B   HS Curls x20 B   Knee Flexion Stretch @ Stairs 5 x 10" B   HS Stretch @ Stairs 3 x 30" B   Noodle Pushdowns x20 B   March Walking 3 min   Step Ups -->       Deep Well    Bicycle 5 min       Treatment:  Aquatic therapy    Assessment:   Patient tolerated aquatic session well.        Plan of Care:  Continue per Plan of care -  As written; Patient would benefit from skilled rehabilitation services to address the above impairments to restore functional capacity.    Thank you for referring this patient to [facility] Orthopaedics - Sports and Spine Rehabilitation    Francene Finders, ATC,PTA              Treatment Start Time 7:45 am   Treatment End Time 8:35am   Total Minutes of treatment 50 min         Total Non-Treatment time (rest)     Total Service Based min of treatment    Total Time-Based minutes of treatment 50 min               Service-Based Procedures/ Modalities     Evaluation    Re-evaluation     Total Service Based Treatment          Time-Based Procedures / Modalities     Aquatic Therapy 50 min   Therapeutic ex     Neuromuscular Re-ed     Total Time-Based Treatment 3              POC DATE:  09/25/22

## 2022-10-31 ENCOUNTER — Ambulatory Visit: Payer: BLUE CROSS/BLUE SHIELD | Admitting: Physical Therapy

## 2022-11-06 ENCOUNTER — Ambulatory Visit: Payer: BLUE CROSS/BLUE SHIELD

## 2022-11-06 ENCOUNTER — Other Ambulatory Visit: Payer: Self-pay

## 2022-11-06 DIAGNOSIS — G8929 Other chronic pain: Secondary | ICD-10-CM | POA: Insufficient documentation

## 2022-11-06 DIAGNOSIS — M25562 Pain in left knee: Secondary | ICD-10-CM | POA: Insufficient documentation

## 2022-11-06 DIAGNOSIS — M25561 Pain in right knee: Secondary | ICD-10-CM | POA: Insufficient documentation

## 2022-11-06 NOTE — Progress Notes (Signed)
[facility] Orthopedic Sports/Spine Rehabilitation  PT Treatment Note      Name: Eileen Bond  DOB: 1976-02-16  Referring Physician: Joan Flores, NP  Diagnosis:   1. Bilateral chronic knee pain            Subjective:  Pain Assessment: 3/10  Patient reports that she has been experiencing a lot of upper trap and cervical pain over the past couple weeks. Overall, her knees are feeling very good.     Objective:  Strength - Therapeutic Exercises per flowsheet  Function: -  Increased pain with sitting/kneeling and especially praying  Education:  Verbal cues for ther ex, Gait training, Joint ed concepts    Objective      Water Walking (Fwd/Bkwd) 8 min Combo   Side Stepping 5 min   Calf Raises x30   Standing Calf Stretch 3 x 20" ea B   Mini Squats x20   Hip Program (Flex, Ext, Abd) x20 ea B   SLB 3 x 20" B   HS Curls x20 B   Knee Flexion Stretch @ Stairs 5 x 10" B   HS Stretch @ Stairs 3 x 30" B   Noodle Pushdowns x20 B   March Walking 3 min   Step Ups -->       Deep Well    Bicycle 5 min       Treatment:  Aquatic therapy    Assessment:   Patient tolerated aquatic session well.        Plan of Care:  Continue per Plan of care -  As written; Patient would benefit from skilled rehabilitation services to address the above impairments to restore functional capacity.    Thank you for referring this patient to [facility] Orthopaedics - Sports and Spine Rehabilitation    Francene Finders, ATC,PTA              Treatment Start Time 7:40 am   Treatment End Time 8:30 am   Total Minutes of treatment 50 min         Total Non-Treatment time (rest)     Total Service Based min of treatment    Total Time-Based minutes of treatment 50 min               Service-Based Procedures/ Modalities     Evaluation    Re-evaluation     Total Service Based Treatment          Time-Based Procedures / Modalities     Aquatic Therapy 50 min   Therapeutic ex     Neuromuscular Re-ed     Total Time-Based Treatment 3              POC DATE:  09/25/22

## 2022-11-09 ENCOUNTER — Other Ambulatory Visit
Admission: RE | Admit: 2022-11-09 | Discharge: 2022-11-09 | Disposition: A | Discharge status: Home / Self Care | Payer: BLUE CROSS/BLUE SHIELD | Source: Ambulatory Visit | Attending: Primary Care | Admitting: Primary Care

## 2022-11-09 DIAGNOSIS — E559 Vitamin D deficiency, unspecified: Secondary | ICD-10-CM | POA: Insufficient documentation

## 2022-11-09 DIAGNOSIS — E119 Type 2 diabetes mellitus without complications: Secondary | ICD-10-CM | POA: Insufficient documentation

## 2022-11-09 DIAGNOSIS — M1990 Unspecified osteoarthritis, unspecified site: Secondary | ICD-10-CM | POA: Insufficient documentation

## 2022-11-09 LAB — LIPID PANEL
Chol/HDL Ratio: 3.8
Cholesterol: 202 mg/dL — AB
HDL: 53 mg/dL (ref 40–60)
LDL Calculated: 119 mg/dL
Non HDL Cholesterol: 149 mg/dL
Triglycerides: 148 mg/dL

## 2022-11-09 LAB — VITAMIN D: 25-OH Vit Total: 23 ng/mL — ABNORMAL LOW (ref 30–60)

## 2022-11-09 LAB — CBC AND DIFFERENTIAL
Baso # K/uL: 0 10*3/uL (ref 0.0–0.2)
Eos # K/uL: 0.1 10*3/uL (ref 0.0–0.5)
Hematocrit: 43 % (ref 34–49)
Hemoglobin: 13.6 g/dL (ref 11.2–16.0)
IMM Granulocytes #: 0 10*3/uL (ref 0.0–0.0)
IMM Granulocytes: 0.3 %
Lymph # K/uL: 2.9 10*3/uL (ref 1.0–5.0)
MCV: 97 fL (ref 75–100)
Mono # K/uL: 0.4 10*3/uL (ref 0.1–1.0)
Neut # K/uL: 3.3 10*3/uL (ref 1.5–6.5)
Nucl RBC # K/uL: 0 10*3/uL (ref 0.0–0.0)
Nucl RBC %: 0 /100 WBC (ref 0.0–0.2)
Platelets: 204 10*3/uL (ref 150–450)
RBC: 4.4 MIL/uL (ref 4.0–5.5)
RDW: 11.8 % (ref 0.0–15.0)
Seg Neut %: 49.3 %
WBC: 6.8 10*3/uL (ref 3.5–11.0)

## 2022-11-09 LAB — COMPREHENSIVE METABOLIC PANEL
ALT: 29 U/L (ref 0–35)
AST: 26 U/L (ref 0–35)
Albumin: 4.3 g/dL (ref 3.5–5.2)
Alk Phos: 76 U/L (ref 35–105)
Anion Gap: 12 (ref 7–16)
Bilirubin,Total: 1 mg/dL (ref 0.0–1.2)
CO2: 25 mmol/L (ref 20–28)
Calcium: 9.4 mg/dL (ref 8.6–10.2)
Chloride: 104 mmol/L (ref 96–108)
Creatinine: 0.89 mg/dL (ref 0.51–0.95)
Glucose: 158 mg/dL — ABNORMAL HIGH (ref 60–99)
Lab: 12 mg/dL (ref 6–20)
Potassium: 4 mmol/L (ref 3.3–5.1)
Sodium: 141 mmol/L (ref 133–145)
Total Protein: 7.3 g/dL (ref 6.3–7.7)
eGFR BY CREAT: 80 *

## 2022-11-09 LAB — HIGH SENSITIVITY CRP: CRP,High Sensitivity: 7.3 mg/L — AB

## 2022-11-09 LAB — MICROALBUMIN, URINE, RANDOM
Creatinine,UR: 229 mg/dL (ref 20–300)
Microalb/Creat Ratio: 9.4 mg MA/g CR (ref 0.0–29.9)
Microalbumin,UR: 2.15 mg/dL

## 2022-11-09 LAB — INSULIN, TOTAL: Insulin: 149 u[IU]/mL — ABNORMAL HIGH (ref 3–25)

## 2022-11-11 LAB — HEMOGLOBIN A1C: Hemoglobin A1C: 6.4 % — ABNORMAL HIGH

## 2022-11-12 DIAGNOSIS — R5381 Other malaise: Secondary | ICD-10-CM | POA: Insufficient documentation

## 2022-11-12 LAB — ANTINUCLEAR ANTIBODY SCREEN: ANA Screen: NEGATIVE

## 2022-11-13 ENCOUNTER — Ambulatory Visit: Payer: BLUE CROSS/BLUE SHIELD

## 2022-11-13 ENCOUNTER — Other Ambulatory Visit: Payer: Self-pay

## 2022-11-13 DIAGNOSIS — M25561 Pain in right knee: Secondary | ICD-10-CM

## 2022-11-13 NOTE — Progress Notes (Signed)
[facility] Orthopedic Sports/Spine Rehabilitation  PT Treatment Note      Name: Eileen Bond  DOB: 1975/06/23  Referring Physician: Joan Flores, NP  Diagnosis:   1. Bilateral chronic knee pain            Subjective:  Pain Assessment: 4/10  Patient reports that she woke up this morning after a difficult night of aches and pains. She notes that she was uncomfortable walking around when first waking up however she feels a bit better now. She states that overall her knee feels better but her whole body seems to be going through something currently.     Objective:  Strength - Therapeutic Exercises per flowsheet  Function: -  Increased pain with sitting/kneeling and especially praying  Education:  Verbal cues for ther ex, Gait training, Joint ed concepts    Objective      Water Walking (Fwd/Bkwd) 8 min Combo   Side Stepping 5 min   Calf Raises x30   Standing Calf Stretch 3 x 20" ea B   Mini Squats x20   Hip Program (Flex, Ext, Abd) x20 ea B   SLB 3 x 20" B   HS Curls x20 B   Knee Flexion Stretch @ Stairs 5 x 10" B   HS Stretch @ Stairs 3 x 30" B   Noodle Pushdowns x20 B   March Walking 3 min   Step Ups -->       Deep Well    Bicycle 5 min       Treatment:  Aquatic therapy    Assessment:   Patient tolerated aquatic session well.        Plan of Care:  Continue per Plan of care -  As written; Patient would benefit from skilled rehabilitation services to address the above impairments to restore functional capacity.    Thank you for referring this patient to [facility] of [facility] Orthopaedics - Sports and Spine Rehabilitation    Francene Finders, ATC,PTA              Treatment Start Time 11:40 am   Treatment End Time 12:30pm   Total Minutes of treatment 50 min         Total Non-Treatment time (rest)     Total Service Based min of treatment    Total Time-Based minutes of treatment 50 min               Service-Based Procedures/ Modalities     Evaluation    Re-evaluation     Total Service Based Treatment           Time-Based Procedures / Modalities     Aquatic Therapy 50 min   Therapeutic ex     Neuromuscular Re-ed     Total Time-Based Treatment 3              POC DATE: 09/25/22

## 2022-11-14 LAB — PROINSULIN: Proinsulin: 31.4 pmol/L — ABNORMAL HIGH (ref <=7.2)

## 2022-11-19 ENCOUNTER — Ambulatory Visit: Payer: BLUE CROSS/BLUE SHIELD

## 2022-11-19 ENCOUNTER — Other Ambulatory Visit: Payer: Self-pay

## 2022-11-19 DIAGNOSIS — M25562 Pain in left knee: Secondary | ICD-10-CM

## 2022-11-19 NOTE — Progress Notes (Signed)
[facility] Orthopedic Sports/Spine Rehabilitation  PT Treatment Note      Name: Eileen Bond  DOB: Feb 07, 1976  Referring Physician: Joan Flores, NP  Diagnosis:   1. Bilateral chronic knee pain            Subjective:  Pain Assessment: 3/10  Patient reports that she had a difficult morning secondary to low back pain. She states that she has been moving around a lot of stuff in her garage and attributes some of her new back pain to this activity. Once she began to move around this morning, she did start to feel a bit better.     Objective:  Strength - Therapeutic Exercises per flowsheet  Function: -  Increased pain with sitting/kneeling and especially praying  Education:  Verbal cues for ther ex, Gait training, Joint ed concepts    Objective      Water Walking (Fwd/Bkwd) 8 min Combo   Side Stepping 5 min   Calf Raises x30   Standing Calf Stretch 3 x 20" ea B   Mini Squats x20   Hip Program (Flex, Ext, Abd) x20 ea B   SLB 3 x 20" B   HS Curls x20 B   Knee Flexion Stretch @ Stairs 5 x 10" B   HS Stretch @ Stairs 3 x 30" B   Noodle Pushdowns x20 B   March Walking 3 min   Step Ups x10 B       Deep Well    Bicycle 5 min       Treatment:  Aquatic therapy    Assessment:   Patient tolerated aquatic session well.        Plan of Care:  Continue per Plan of care -  As written; Patient would benefit from skilled rehabilitation services to address the above impairments to restore functional capacity.    Thank you for referring this patient to [facility] of [facility] Orthopaedics - Sports and Spine Rehabilitation    Francene Finders, ATC,PTA              Treatment Start Time 8:00am   Treatment End Time 8:50am   Total Minutes of treatment 50 min         Total Non-Treatment time (rest)     Total Service Based min of treatment    Total Time-Based minutes of treatment 50 min               Service-Based Procedures/ Modalities     Evaluation    Re-evaluation     Total Service Based Treatment          Time-Based Procedures /  Modalities     Aquatic Therapy 50 min   Therapeutic ex     Neuromuscular Re-ed     Total Time-Based Treatment 3              POC DATE: 09/25/22

## 2022-11-22 ENCOUNTER — Other Ambulatory Visit: Payer: Self-pay

## 2022-11-22 ENCOUNTER — Ambulatory Visit: Payer: BLUE CROSS/BLUE SHIELD | Admitting: Physical Therapy

## 2022-11-22 DIAGNOSIS — G8929 Other chronic pain: Secondary | ICD-10-CM

## 2022-11-22 NOTE — Progress Notes (Signed)
Sent via: eRecord EMR INBASKET    Physician attestation for [facility] Plan of Care: Physician/NP/PA:  Joan Flores, NP  Per signature, I have reviewed and agree with the documented plan of care.    ___________________________________________________________    Please sign this Medicare plan of care for outpatient therapy treatment as required by Medicare. If you have any questions, please contact us at [phone]. We appreciate your prompt attention to this request.    Sincerely,   [facility]       [facility] ORTHOPEDIC SPORTS+SPINE REHABILITATION   AQUATIC THERAPY EVALUATION      Diagnosis: Chronic B knee pain  Onset date:  Chronic  Date of surgery: NA      Subjective:   Pt has noticed some improvement w/ her knees since working w/ our awesome [employer] staff. Sometimes she still wakes up w/ some pain/stiffness that eventually goes away around . Based on progress, she definitely wants to continue w/ [employer] participation. She is starting Ozempic d/t borderline diabetic status/BMI & feels this will help reduce some of the stress off her knees when she loses weight.       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       Symptom location: Medial and Anterior, bilateral  Relevant symptoms:  Pain , Decreased ROM, Decreased strength  Symptom frequency: Persistent  Symptoms worsen with: Kneeling, Stairs, Weight bearing, Standing, Walking  Symptoms improve with: Medication  Assistive device:  none  Patient's goals for therapy: Perform ambulation at safe level with or without assistive device to promote safe community activity/ social function (shopping, attendance at appointments/events), Reduce pain, Achieve independence with home program for  self care / condition management, Return to sports / activities    ROM/Strength    HIP LEFT RIGHT STRENGTH    PROM AROM PROM AROM Left Right   Flexion     4+ 4+   Extension         Abduction     4 4   IR         ER         Adduction                        KNEE LEFT RIGHT STRENGTH    PROM AROM PROM AROM Left Right   Flexion  120  115 4 4   Extension  2 HE  2 HE 4 4                30s STS Test: 15 reps -- no pain!       Functional:  Stand more than 20 minutes, -  has difficulty  to perform.  Sit more than 20 minutes - able to perform.  Walk more than 20 minutes -  has difficulty  to perform.  Ascend stairs with reciprocal gait -  has difficulty  to perform.  Descend stairs with reciprocal gait - able to perform.  Attain prayer position 5 times a day for a total time of 7 minutes- unable to perform; but has been improving.     Assessment:    Findings consistent with 47 y.o. F with Chronic Bilateral Knee pain with pain, ROM limitations, strength limitations, functional limitations. Patient would continue  to benefit from skilled aquatic therapy to address identified impairments, functional limitations, and pain. Patient currently unable to tolerate land-based intervention and would benefit from therapy in the aquatic environment for greater mobility, decreased joint compression and reduced weight bearing forces. Will transition patient to land-based therapy or independent community program as appropriate.    Personal factors affecting treatment/recovery:  none identified  Comorbidities affecting treatment/recovery:  none noted  Clinical presentation:  stable  Patient complexity:    low level as indicated by above stability of condition, personal factors, environmental factors and comorbidities in addition to patient symptom presentation and impairments found on physical exam.    Contraindications to Aquatic Therapy Reviewed: Yes    Contagious water or air-borne infection/disease  Open wounds/incisions  Bowel and/or  bladder incontinence  Diarrhea and/or vomiting   Infection/fever      Short Term Goals: (3 week(s)): Decrease c/o max pain to < 4/10 and Minimal assistance with HEP/ education concepts  Long Term Goals: (6 week(s)): Pain/Sx 0 - minimal, ROM/ flexibility WNL , Restoration of functional strength, Independent with HEP/education , Functional return to ADLs / activities without limitations    Treatment Plan:   Options / plan reviewed with patient:  Yes  Freq 1 times per week for 6 week(s)    Treatment plan inclusive of:   Exercise: Aquatic Physical Therapy Including AROM, Stretching, Strengthening, Progressive Resistive, Aerobic exercise   Functional: Functional rehab, Endurance training, Self-care, Home management      Thank you for referring this patient to [facility].    Joseph Berkshire PT, DPT, OCS     LE 3 x 10   STS 3 x 10   Step up 3 x 10   Heel slide 30x                                      Treatment Start Time 730am   Treatment End Time 8am   Total Minutes of treatment 30       Total Non-Treatment time (rest)    Total Service Based min of treatment    Total Time-Based minutes of treatment            Service-Based Procedures/ Modalities    Evaluation    Re-evaluation 15   Total Service Based Treatment        Time-Based Procedures / Modalities    Aquatic Therapy    Therapeutic ex 15   Neuromuscular Re-ed    Total Time-Based Treatment total          POC DATE: 09/25/22

## 2022-11-28 ENCOUNTER — Other Ambulatory Visit: Payer: Self-pay | Admitting: Gastroenterology

## 2022-11-28 ENCOUNTER — Other Ambulatory Visit
Admission: RE | Admit: 2022-11-28 | Discharge: 2022-11-28 | Disposition: A | Discharge status: Home / Self Care | Payer: BLUE CROSS/BLUE SHIELD | Source: Ambulatory Visit | Attending: Pathology | Admitting: Pathology

## 2022-11-28 DIAGNOSIS — Z1211 Encounter for screening for malignant neoplasm of colon: Secondary | ICD-10-CM | POA: Insufficient documentation

## 2022-11-28 NOTE — Procedures (Signed)
[facility]    Colonoscopy    Date of Procedure: 11/28/2022   Primary Physician: Rogue Jury, MD        Attending Physician: Beckie Busing. Victorio Palm, M.D.  Patient Name: Eileen Bond      Indications: Colorectal cancer screening-average risk    Previous colonoscopy: No    Medications:Fentanyl 100 mcg IV and Midazolam 5 mg IV were administered incrementally over the course of the procedure to achieve an adequate level of conscious sedation.    Pre-Procedure Physical:  Patient's medications, allergies, past medical, surgical, social and family histories were reviewed and updated as appropriate.    BP 138/82, P 74  Airway:  normal  Heart:  normal S1 and S2  Lungs:  clear  Abdomen:  soft, nontender, normal bowel sounds  Mental Status:  awake and alert; oriented to person, place, and time     ASA Classification:  2    Informed Consent: Prior to the procedure, the patient was explained the risk, benefits and alternatives including the risk of bleeding, infection or perforation and informed consent was obtained.     Procedure Details: The patient was placed in the left lateral decubitus position and monitored continuously with ECG tracing, pulse oximetry, blood pressure monitoring and direct observations. After anorectal examination was performed, the Olympus  PCF-190 with endocuff was inserted into the rectum and advanced under direct vision to the terminal ileum. The procedure was considered not difficult..  Cecum withdrawal time:  9 minutes.    Narrative:  During withdrawal examination, the final quality of the prep was [facility] Bowel Prep Scale   Prep:    Right Colon: Grade3- (entire mucosa of colon segment seen well, with no residual staining, small fragments of stool, or opaque liguid)    Transverse Colon: Grade 3- (entire mucosa of colon segment seen well, with no residual staining, small fragments of stool, or opaque liguid)    Left Colon: Grade 3- (entire mucosa of colon segment  seen well, with no residual staining, small fragments of stool, or opaque liguid)    A careful inspection was made as the colonoscope was withdrawn, a retroflexed view of the rectum was included; findings and interventions are described below. Appropriate photo documentation was obtained. The patient  recovered in the [facility] recovery unit.    Findings:     Terminal Ileum: Normal.    Colon: There was mild sigmoid colon diverticulosis (small mouth).  A small sessile 3 mm rectal polyp was removed with cold snare.  Small internal hemorrhoids seen on retroflexion.    Complications: The patient tolerated the procedure well.    Impression:    There was mild sigmoid colon diverticulosis (small mouth).    A small sessile 3 mm rectal polyp was removed with cold snare.    Small internal hemorrhoids seen on retroflexion.    Recommendations:  1. Await pathology.  2. If hyperplastic polyp, repeat colonoscopy in 10 years.  3. If adenomatous polyps, repeat colonoscopy in 5 years.    Electronically signed: Nelda Bucks, MD    Note created: 11/28/2022  Office Phone # [phone]

## 2022-12-04 NOTE — Progress Notes (Signed)
Orthopaedic Surgery Clinic Note       Name: Eileen Bond  MRN: [id number]  Date: 12/12/2022     CC: Bilateral knee pain      History       Eileen Bond is a 47 y.o. that is being seen for evaluation of bilateral knee pain.    Today, this patient states that she has been to several land based PT sessions for her knees and this has significantly improved her pain. She still has morning stiffness but that usually dissipates within 30 minutes or so. Will be transitioning to water based therapy shortly. Has an upcoming appointment with Dr. Azell Der on 12/14/22 to discussed PRP injections for her knees.  She has had significant relief with PRP injections in the past, has had these injections in 2018 or 19.    Past medical history, past surgical history, medications, allergies, family history, social history, and review of systems were reviewed today and have been documented separately in this encounter.      Answers submitted by the patient for this visit:  Questionnaire about: RIGHT KNEE PAIN HPI (Submitted on 12/12/2022)  Chief Complaint: RIGHT KNEE PAIN HPI    Physical Exam       General: She is in no acute distress.  She  is alert and oriented x 3.   Respiratory: adequate effort  Cardiovascular: extremities perfused  Abdomen: non distended  Skin: no rashes      Bilateral Knee:   Skin: no skin lesions, rashes, or discoloration  Prior scars: none   Effusion: Hard to assess due to body habitus  Alignment: valgus, Correctable: Yes  Tenderness: None on the right knee and medial lateral joint line on the left knee  ROM: Extension: 0 degrees (full extension), no active extension lag, Flexion: 90 degrees  There is pain with range of motion of the knee as the above AROM to 90 degrees  Extension: None varus/valgus laxity, firm endpoints  Flexion (30 degrees): None varus/valgus laxity, firm endpoints    Special testing: Perform straight leg raise.  Negative McMurray's bilaterally.  Negative anterior and posterior  drawer.    Neuro:  Motor: quadriceps, dorsiflexion, plantar flexion, and intact  Sensation: Sensation is intact to light touch in saphenous/sural/tibial/deep and superficial peroneal nerve distributions   Circulation: Dorsalis pedis pulses palpable and symmetric bilaterally, toes demonstrate brisk capillary refill.   Peripheral vascular skin changes/ulcers: No      Labs         Lab results: 11/09/22  1402   Sodium 141   Potassium 4.0   Chloride 104   CO2 25   UN 12   Creatinine 0.89   Glucose 158*   Calcium 9.4            Lab results: 11/09/22  1402   WBC 6.8   Hemoglobin 13.6   Hematocrit 43   RBC 4.4   Platelets 204        Hemoglobin A1C   Date Value Ref Range Status   11/09/2022 6.4 (H) % Final     Comment:     Ref Range <=5.6  HbA1c values of 5.7-6.4% indicate an increased risk for developing  diabetes mellitus.  HbA1c values greater than or equal to 6.5% are diagnostic of  diabetes mellitus.  For diagnosis of diabetes in individuals without unequivocal  hyperglycemia, results should be confirmed by repeat testing.               Imaging  XRAY: 08/15/22 bilateral knee radiographs were personally reviewed and interpreted and demonstrate severe medial compartment degenerative osteoarthritis as evidenced by bone on bone articulation, subchondral sclerosis and peripheral osteophyte formation. Additionally moderate lateral and patellofemoral degenerative osteoarthritic changes noted as well.      Assessment/Plan       Eileen Bond is a 47 y.o. 1 with:      No diagnosis found.      I discussed the diagnosis and treatment plan with the patient.   - Imaging reviewed today with the patient at bedside.    This is patient's clinical diagnosis of bilateral severe primary osteoarthritis and associated treatment options.  At this time, this patient has tried many oral and topical anti-inflammatory medications, topical creams in addition to corticosteroid injections, gel injections as well as PRP injections.  Out of all of  this, she states that PRP injections have been most beneficial with regards to relieving her knee pain.  Her last injection was in 2017 and she is experience lasting relief until approximately March 2024. She will continue with PT as this has been helpful for pain management. She has an upcoming appointment with Dr. Azell Der on 12/14/22 to discuss other non-operative pain management measures as she is not an appropriate surgical candidate at this time. She can follow up as needed. She is always welcome to call in the meantime should she need anything.    The patient expressed appreciation and understanding with the above stated plan of care.     All questions invited and answered to patient's satisfaction.     Follow up   As needed      Kenna Gilbert, MD  Orthopaedic Surgery  12/12/2022  9:26 AM

## 2022-12-06 ENCOUNTER — Ambulatory Visit: Payer: BLUE CROSS/BLUE SHIELD

## 2022-12-06 LAB — SURGICAL PATHOLOGY

## 2022-12-11 ENCOUNTER — Ambulatory Visit: Payer: BLUE CROSS/BLUE SHIELD

## 2022-12-11 ENCOUNTER — Other Ambulatory Visit: Payer: Self-pay

## 2022-12-11 DIAGNOSIS — G8929 Other chronic pain: Secondary | ICD-10-CM | POA: Insufficient documentation

## 2022-12-11 DIAGNOSIS — M25562 Pain in left knee: Secondary | ICD-10-CM | POA: Insufficient documentation

## 2022-12-11 DIAGNOSIS — M25561 Pain in right knee: Secondary | ICD-10-CM | POA: Insufficient documentation

## 2022-12-11 NOTE — Progress Notes (Signed)
[facility] Orthopedic Sports/Spine Rehabilitation  PT Treatment Note      Name: Eileen Bond  DOB: 11-13-1975  Referring Physician: Joan Flores, NP  Diagnosis:   1. Bilateral chronic knee pain            Subjective:  Pain Assessment: 1/10  Patient reports that she feels pretty good today. She notes that yesterday she was able to participate in an activity with her family without much issue or pain.     Objective:  Strength - Therapeutic Exercises per flowsheet  Function: -  Increased pain with sitting/kneeling and especially praying  Education:  Verbal cues for ther ex, Gait training, Joint ed concepts    Objective      Water Walking (Fwd/Bkwd) 8 min Combo   Side Stepping 5 min   Calf Raises x30   Standing Calf Stretch 3 x 20" ea B   Mini Squats x30   Hip Program (Flex, Ext, Abd) x20 ea B   SLB 3 x 20" B   HS Curls x20 B   Knee Flexion Stretch @ Stairs 5 x 10" B   HS Stretch @ Stairs 3 x 30" B   Noodle Pushdowns x20 B   March Walking 3 min   Step Ups x15 B       Deep Well    Bicycle 5 min       Treatment:  Aquatic therapy    Assessment:   Patient tolerated aquatic session well.        Plan of Care:  Continue per Plan of care -  As written; Patient would benefit from skilled rehabilitation services to address the above impairments to restore functional capacity.    Thank you for referring this patient to [facility] Orthopaedics - Sports and Spine Rehabilitation    Francene Finders, ATC,PTA              Treatment Start Time 9:10am   Treatment End Time 10:00am   Total Minutes of treatment 50 min         Total Non-Treatment time (rest)     Total Service Based min of treatment    Total Time-Based minutes of treatment 50 min               Service-Based Procedures/ Modalities     Evaluation    Re-evaluation     Total Service Based Treatment          Time-Based Procedures / Modalities     Aquatic Therapy 50 min   Therapeutic ex     Neuromuscular Re-ed     Total Time-Based Treatment 3              POC  DATE: 09/25/22

## 2022-12-12 ENCOUNTER — Encounter: Payer: Self-pay | Admitting: Orthopaedic Surgery

## 2022-12-12 ENCOUNTER — Other Ambulatory Visit: Payer: Self-pay

## 2022-12-12 ENCOUNTER — Ambulatory Visit: Payer: BLUE CROSS/BLUE SHIELD | Attending: Orthopaedic Surgery | Admitting: Orthopaedic Surgery

## 2022-12-12 VITALS — BP 138/88 | HR 85 | Ht 65.0 in | Wt 210.0 lb

## 2022-12-12 DIAGNOSIS — M170 Bilateral primary osteoarthritis of knee: Secondary | ICD-10-CM

## 2022-12-14 ENCOUNTER — Ambulatory Visit: Payer: BLUE CROSS/BLUE SHIELD | Admitting: Orthopedic Surgery

## 2022-12-18 ENCOUNTER — Ambulatory Visit: Payer: BLUE CROSS/BLUE SHIELD | Admitting: Orthopedic Surgery

## 2022-12-18 ENCOUNTER — Encounter: Payer: Self-pay | Admitting: Orthopedic Surgery

## 2022-12-18 VITALS — BP 135/89 | HR 83 | Ht 65.5 in | Wt 210.0 lb

## 2022-12-18 DIAGNOSIS — M25561 Pain in right knee: Secondary | ICD-10-CM

## 2022-12-18 DIAGNOSIS — G8929 Other chronic pain: Secondary | ICD-10-CM

## 2022-12-18 DIAGNOSIS — M170 Bilateral primary osteoarthritis of knee: Secondary | ICD-10-CM

## 2022-12-18 DIAGNOSIS — M25562 Pain in left knee: Secondary | ICD-10-CM

## 2022-12-18 NOTE — Progress Notes (Signed)
Chief Complaint   Patient presents with    Left Knee - New Patient Visit    Right Knee - New Patient Visit       History:  Eileen Bond is a 47 y.o. female who presents today with knee pain.    Right Knee  What is your goal for today's visit?:  PRP  Date of onset:  04/03/2007  Was this the result of an injury?: No    Pain level:  4/10  Pain quality:  Clicking, discomfort, dull ache, instability and sore  Diagnostic workup:  Blood work and X-ray  Treatments tried:  Acetaminophen, activity modification, aquatic therapy, platelet rich plasma, corticosteroid injection and viscosupplementation (Initially noted improvement with Euflexxa injections in the past, more recently noted less significant improvement, has had most significant improvement with previous PRP injection, last performed several years ago)  Progression since onset:  Gradually worsening  Work related condition?: No    Work status:  No work        Pain    12/18/22 1005   PainSc:   3   PainLoc: Knee         No past medical history on file.      No past surgical history on file.    Current Outpatient Medications   Medication    metFORMIN 500 mg tablet    rosuvastatin 20 mg tablet    tacrolimus (PROTOPIC) 0.1 % ointment    betamethasone diprop augmented (DIPROLENE-AF) 0.05 % ointment     No current facility-administered medications for this visit.       No Known Allergies (drug, envir, food or latex)    No family history on file.    Social History     Socioeconomic History    Marital status: Married   Tobacco Use    Smoking status: Never    Smokeless tobacco: Never         She reports that she has never smoked. She has never used smokeless tobacco.    Vitals:    12/18/22 1005   BP: 135/89   Pulse: 83   Weight:    Height:        Physical Examination:    Ortho Exam  Right Knee exam:    Inspection:  Skin normal without visible scars, erythema, or trauma. No obvious asymmetry, atrophy, or swelling  Palpation:  (+) medial joint line tenderness  (+) palpable  crepitus  No tenderness to palpation over the patella tendon, the quad tendon, or the iliotibial band  Trace joint effusion palpable  Passive ROM:  Approximately 110-120 of passive hip flexion  Approximately 30 passive internal hip rotation, 45 of passive external hip rotation    Mild loss of terminal knee extension and knee flexion to approximately 90  Special testing:  FADIR and FABER (-) in hip testing  Lachman (-)  No posterior tibial sag noted   Posterior drawer (-)  No gapping with varus or valgus stressing at 0 or 30    Left Knee exam:    Inspection:  Skin normal without visible scars, erythema, or trauma. No obvious asymmetry, atrophy, or swelling  Palpation:  (+) medial joint line tenderness  (+) palpable crepitus  No tenderness to palpation over the patella tendon, the quad tendon, or the iliotibial band  Trace joint effusion palpable  Passive ROM:  Approximately 110-120 of passive hip flexion  Approximately 30 passive internal hip rotation, 45 of passive external hip rotation    Mild loss of  terminal knee extension and knee flexion to approximately 90  Special testing:  FADIR and FABER (-) in hip testing  Lachman (-)  No posterior tibial sag noted   Posterior drawer (-)  No gapping with varus or valgus stressing at 0 or 30      Imaging:  Imaging studies were personally reviewed by me and demonstrate the following:  Review of the patient's previous plain film radiographs of the bilateral knees from 08/15/2022 indicates evidence of advanced bilateral knee osteoarthritis, with noted medial joint space narrowing and tricompartmental subchondral sclerotic development.      Assessment/Plan:    ICD-10-CM ICD-9-CM   1. Chronic pain of both knees  M25.561 719.46    M25.562 338.29    G89.29    2. Primary osteoarthritis of both knees  M17.0 715.16       The patient's description of symptoms, physical examination findings, and review of plain film radiographs, are all consistent with a diagnosis of knee  osteoarthritis.  We discussed the degenerative and progressive nature of this disease.  We discussed the wide array of treatment options from conservative management for more mild disease all the way to more aggressive interventions for advanced disease.      We discussed the role chronic comorbidities and obesity play in complicating this disease and that they can limit treatment options. The patient was advised that strict control of comorbidities and weight are vital for treating osteoarthritis.      We also discussed that the definitive and most beneficial long-term treatment for this disease is physical therapy.  Physical therapy can be supplemented with other conservative treatments including ice, anti-inflammatory medications (as allowable given medical comorbidities), and injections as needed.  Injection options include corticosteroid, viscosupplementation, and possibly more advanced regenerative medicine injections including PRP and/or bone marrow concentrate.      The patient would like to proceed with the following:  -Patient would like to move forward with PRP injection will follow-up to undergo right knee PRP injection using the [facility] system  -Encouraged continued water-based PT and consider addition of water-based recreational activities moving forward  -Referral to orthotics for medial unloader brace      Follow up for right knee PRP Angel injection.      Meds and Orders Placed this Visit:  1. Chronic pain of both knees  - Angel PRP; Future    2. Primary osteoarthritis of both knees  - Angel PRP; Future        Please excuse any grammatical errors, this note was transcribed using voice recognition software.

## 2022-12-21 ENCOUNTER — Ambulatory Visit: Payer: BLUE CROSS/BLUE SHIELD

## 2022-12-24 ENCOUNTER — Ambulatory Visit: Payer: BLUE CROSS/BLUE SHIELD

## 2022-12-24 ENCOUNTER — Other Ambulatory Visit: Payer: Self-pay

## 2022-12-24 DIAGNOSIS — G8929 Other chronic pain: Secondary | ICD-10-CM

## 2022-12-24 NOTE — Progress Notes (Signed)
[facility] Orthopedic Sports/Spine Rehabilitation  PT Treatment Note      Name: Eileen Bond  DOB: 1975/08/05  Referring Physician: Joan Flores, NP  Diagnosis:   1. Bilateral chronic knee pain            Subjective:  Pain Assessment: 1/10  Patient reports that she doesn't usually have a lot of pain but feels uncomfortable doing motions like squatting. She notes that if she does "too much" than she will experience increased pain.     Objective:  Strength - Therapeutic Exercises per flowsheet  Function: -  Increased pain with sitting/kneeling and especially praying  Education:  Verbal cues for ther ex, Gait training, Joint ed concepts    Objective      Water Walking (Fwd/Bkwd) 8 min Combo   Side Stepping 5 min   Calf Raises x30   Standing Calf Stretch 3 x 20" ea B   Mini Squats x30   Hip Program (Flex, Ext, Abd) x20 ea B   SLB 3 x 20" B   HS Curls x20 B   Knee Flexion Stretch @ Stairs 5 x 10" B   HS Stretch @ Stairs 3 x 30" B   Noodle Pushdowns x20 B   March Walking 3 min   Step Ups x15 B       Deep Well    Bicycle 5 min       Treatment:  Aquatic therapy    Assessment:   Patient tolerated aquatic session well.        Plan of Care:  Continue per Plan of care -  As written; Patient would benefit from skilled rehabilitation services to address the above impairments to restore functional capacity.    Thank you for referring this patient to [facility] Orthopaedics - Sports and Spine Rehabilitation    Francene Finders, ATC,PTA              Treatment Start Time 9:05am   Treatment End Time 9:55am   Total Minutes of treatment 50 min         Total Non-Treatment time (rest)     Total Service Based min of treatment    Total Time-Based minutes of treatment 50 min               Service-Based Procedures/ Modalities     Evaluation    Re-evaluation     Total Service Based Treatment          Time-Based Procedures / Modalities     Aquatic Therapy 50 min   Therapeutic ex     Neuromuscular Re-ed     Total Time-Based  Treatment 3              POC DATE: 09/25/22

## 2022-12-26 ENCOUNTER — Encounter: Payer: Self-pay | Admitting: Dermatology

## 2022-12-27 ENCOUNTER — Ambulatory Visit: Payer: BLUE CROSS/BLUE SHIELD | Admitting: Dermatology

## 2022-12-27 ENCOUNTER — Ambulatory Visit: Payer: BLUE CROSS/BLUE SHIELD

## 2022-12-27 DIAGNOSIS — G8929 Other chronic pain: Secondary | ICD-10-CM

## 2022-12-27 DIAGNOSIS — M170 Bilateral primary osteoarthritis of knee: Secondary | ICD-10-CM

## 2022-12-27 NOTE — Progress Notes (Signed)
Walk-in   MEASUREMENT VISIT    Patient name: Eileen Bond     ICD-10-CM ICD-9-CM   1. Chronic pain of both knees  M25.561 719.46    M25.562 338.29    G89.29    2. Primary osteoarthritis of both knees  M17.0 715.16     Pain    12/27/22 1045   PainSc:   0 - No pain       Subjective:  Patient seen to be measured and discuss unloading ko's.    Objective:    Measurements taken today: 6" Above Knee Center: 25.5", Knee Center: 20.5", and 6" Below Knee Center: 19"    The item prescribed for Eileen Bond is not available at today's visit.      Assessment:    Device is not available because:   Device is not stocked at [facility]    The following device will be ordered:  Laterality: bilateral  Device:  unloader one's  Brand: Ossur               Size: x-large  Part Number:  [id number]  Plan:  Patient will receive a call when the device arrives  The patient must be seen to be fit with this device  Patient will be contacted by [facility] to confirm insurance coverage

## 2022-12-27 NOTE — Progress Notes (Signed)
O&P AUTHORIZATION REQUEST FORM    Name: Nasia Monahan  DOB:  Apr 04, 1975    Referring Provider: Philis Pique, MD      Authorization Type: otc bilateral medial unloader ko's    Patient Activity Level:  47 years old  Side:  bilateral    Diagnosis:      ICD-10-CM ICD-9-CM   1. Chronic pain of both knees  M25.561 719.46    M25.562 338.29    G89.29    2. Primary osteoarthritis of both knees  M17.0 715.16       Recommended Device:  OSSUR UNLOADER ONE    Code Selection:    [facility] Code L-Code # Long Description   60109323 [id number] 2 HB Ots Uni Functional Ko     Initials:   LT   I have reviewed and approve the codes above based on my treatment recommendation     Justification:        Knee orthosis (ko), single upright, thigh and calf, with adjustable flexion and extension joint (unicentric or polycentric), medial-lateral and rotation control, with or without varus/valgus adjustment, prefabricated, off-the-shelf [id number]    Patient is having difficulty performing religious requirements, activities of daily living due to chronic knee pain.

## 2022-12-28 ENCOUNTER — Ambulatory Visit: Payer: BLUE CROSS/BLUE SHIELD

## 2023-01-02 ENCOUNTER — Ambulatory Visit: Payer: BLUE CROSS/BLUE SHIELD | Admitting: Dermatology

## 2023-01-04 ENCOUNTER — Ambulatory Visit: Payer: BLUE CROSS/BLUE SHIELD | Admitting: Physical Therapy

## 2023-01-04 NOTE — Progress Notes (Unsigned)
Sent via: eRecord EMR INBASKET    Physician attestation for [facility] Plan of Care: Physician/NP/PA:  Joan Flores, NP  Per signature, I have reviewed and agree with the documented plan of care.    ___________________________________________________________    Please sign this Medicare plan of care for outpatient therapy treatment as required by Medicare. If you have any questions, please contact us at [phone]. We appreciate your prompt attention to this request.    Sincerely,    of [facility]       [facility] ORTHOPEDIC SPORTS+SPINE REHABILITATION   AQUATIC THERAPY EVALUATION      Diagnosis: Chronic B knee pain  Onset date:  Chronic  Date of surgery: NA      Subjective:   Pt has noticed some improvement w/ her knees since working w/ our awesome APT staff. Sometimes she still wakes up w/ some pain/stiffness that eventually goes away around . Based on progress, she definitely wants to continue w/ APT participation. She is starting Ozempic d/t borderline diabetic status/BMI & feels this will help reduce some of the stress off her knees when she loses weight.       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       Symptom location: Medial and Anterior, bilateral  Relevant symptoms:  Pain , Decreased ROM, Decreased strength  Symptom frequency: Persistent  Symptoms worsen with: Kneeling, Stairs, Weight bearing, Standing, Walking  Symptoms improve with: Medication  Assistive device:  none  Patient's goals for therapy: Perform ambulation at safe level with or without assistive device to promote safe community activity/ social function (shopping, attendance at appointments/events), Reduce pain, Achieve independence with home program for  self care / condition management, Return to sports / activities    ROM/Strength    HIP LEFT RIGHT STRENGTH    PROM AROM PROM AROM Left Right   Flexion     4+ 4+   Extension         Abduction     4 4   IR         ER         Adduction                        KNEE LEFT RIGHT STRENGTH    PROM AROM PROM AROM Left Right   Flexion  120  115 4 4   Extension  2 HE  2 HE 4 4                30s STS Test: 15 reps -- no pain!       Functional:  Stand more than 20 minutes, -  has difficulty  to perform.  Sit more than 20 minutes - able to perform.  Walk more than 20 minutes -  has difficulty  to perform.  Ascend stairs with reciprocal gait -  has difficulty  to perform.  Descend stairs with reciprocal gait - able to perform.  Attain prayer position 5 times a day for a total time of 7 minutes- unable to perform; but has been improving.     Assessment:    Findings consistent with 47 y.o. F with Chronic Bilateral Knee pain with pain, ROM limitations, strength limitations, functional limitations. Patient would continue  to benefit from skilled aquatic therapy to address identified impairments, functional limitations, and pain. Patient currently unable to tolerate land-based intervention and would benefit from therapy in the aquatic environment for greater mobility, decreased joint compression and reduced weight bearing forces. Will transition patient to land-based therapy or independent community program as appropriate.    Personal factors affecting treatment/recovery:  none identified  Comorbidities affecting treatment/recovery:  none noted  Clinical presentation:  stable  Patient complexity:    low level as indicated by above stability of condition, personal factors, environmental factors and comorbidities in addition to patient symptom presentation and impairments found on physical exam.    Contraindications to Aquatic Therapy Reviewed: Yes    Contagious water or air-borne infection/disease  Open wounds/incisions  Bowel and/or  bladder incontinence  Diarrhea and/or vomiting   Infection/fever      Short Term Goals: (3 week(s)): Decrease c/o max pain to < 4/10 and Minimal assistance with HEP/ education concepts  Long Term Goals: (6 week(s)): Pain/Sx 0 - minimal, ROM/ flexibility WNL , Restoration of functional strength, Independent with HEP/education , Functional return to ADLs / activities without limitations    Treatment Plan:   Options / plan reviewed with patient:  Yes  Freq 1 times per week for 6 week(s)    Treatment plan inclusive of:   Exercise: Aquatic Physical Therapy Including AROM, Stretching, Strengthening, Progressive Resistive, Aerobic exercise   Functional: Functional rehab, Endurance training, Self-care, Home management      Thank you for referring this patient to [facility] Orthopedic Sports and Spine Rehabilitation.    Joseph Berkshire PT, DPT, OCS     LE 3 x 10   STS 3 x 10   Step up 3 x 10   Heel slide 30x                                      Treatment Start Time    Treatment End Time    Total Minutes of treatment 30       Total Non-Treatment time (rest)    Total Service Based min of treatment    Total Time-Based minutes of treatment            Service-Based Procedures/ Modalities    Evaluation    Re-evaluation 15   Total Service Based Treatment        Time-Based Procedures / Modalities    Aquatic Therapy    Therapeutic ex 15   Neuromuscular Re-ed    Total Time-Based Treatment total          POC DATE: 09/25/22

## 2023-01-08 ENCOUNTER — Ambulatory Visit: Payer: BLUE CROSS/BLUE SHIELD

## 2023-01-08 DIAGNOSIS — G8929 Other chronic pain: Secondary | ICD-10-CM

## 2023-01-08 DIAGNOSIS — M170 Bilateral primary osteoarthritis of knee: Secondary | ICD-10-CM

## 2023-01-08 NOTE — Progress Notes (Signed)
Walk-in   MEASUREMENT VISIT    Patient name: Eileen Bond     ICD-10-CM ICD-9-CM   1. Chronic pain of both knees  M25.561 719.46    M25.562 338.29    G89.29    2. Primary osteoarthritis of both knees  M17.0 715.16     There were no vitals filed for this visit.    Subjective:  Patient seen to be fit into bilateral ossur unloader one kos, however while waking her thighs are making contact and disengaging clip on proximal end of brace, which happened multiple times during appointment.  New measurements obtained today to try different style bracing that wont cause this issue.      Measurements taken today: ML at right: 6", left 6.5", 6" Above Knee Center: 24.5", Knee Center: right 20", left 22", and 6" Below Knee Center: right 18", left 19.5"    The item prescribed for Eileen Bond is not available at today's visit.      Assessment:    Device is not available because:   Device is not stocked at [facility]    The following device will be ordered:  Laterality: bilateral  Device:  rebel reliever  Brand:  thusane                Size: xx-large  Part Number:  [id number], [id number]  Plan:  Patient will receive a call when the device arrives  The patient must be seen to be fit with this device  Patient will be contacted by [facility] to confirm insurance coverage

## 2023-01-08 NOTE — Progress Notes (Signed)
O&P AUTHORIZATION REQUEST FORM    Name: Eileen Bond  DOB:  [birth year]    Authorization Type: ots for bilaral medial unloader ko  Referring Provider: Philis Pique, MD    Patient Activity Level:  47 years old  Side:  bilateral    Diagnosis:      ICD-10-CM ICD-9-CM   1. Chronic pain of both knees  M25.561 719.46    M25.562 338.29    G89.29    2. Primary osteoarthritis of both knees  M17.0 715.16       Recommended Device:  Thusane rebel reliever ko's    Code Selection:    [facility] Code L-Code # Long Description         [id number] 2 Knee orthosis (ko), double upright, thigh and calf, with adjustable flexion and extension joint (unicentric or polycentric), medial-lateral and rotation control, with or without varus/valgus adjustment, prefabricated, off-the-shelf           Initials:   lt   I have reviewed and approve the codes above based on my treatment recommendation     Justification:    Functional OA KO braces control medial, lateral and rotational movement which aids in stability while using a varus or valgus adjustment to offload compartmental pressure in the knee.

## 2023-01-15 ENCOUNTER — Telehealth: Payer: Self-pay

## 2023-01-15 NOTE — Telephone Encounter (Signed)
Called pt and left voice mail to contact us to reschedule.

## 2023-01-16 ENCOUNTER — Other Ambulatory Visit: Payer: Self-pay

## 2023-01-16 ENCOUNTER — Ambulatory Visit: Payer: BLUE CROSS/BLUE SHIELD | Admitting: Physical Therapy

## 2023-01-16 DIAGNOSIS — M25562 Pain in left knee: Secondary | ICD-10-CM | POA: Insufficient documentation

## 2023-01-16 DIAGNOSIS — M25561 Pain in right knee: Secondary | ICD-10-CM | POA: Insufficient documentation

## 2023-01-16 DIAGNOSIS — G8929 Other chronic pain: Secondary | ICD-10-CM | POA: Insufficient documentation

## 2023-01-16 NOTE — Progress Notes (Signed)
Sent via: eRecord EMR INBASKET    Physician attestation for [facility] Plan of Care: Physician/NP/PA:  Joan Flores, NP  Per signature, I have reviewed and agree with the documented plan of care.    ___________________________________________________________    Please sign this Medicare plan of care for outpatient therapy treatment as required by Medicare. If you have any questions, please contact us at [phone]. We appreciate your prompt attention to this request.    Sincerely,   [place] of [facility]       [facility] ORTHOPEDIC SPORTS+SPINE REHABILITATION   AQUATIC THERAPY EVALUATION      Diagnosis: Chronic B knee pain  Onset date:  Chronic  Date of surgery: NA      Subjective:   Pt has notes continued improvement w/ her knees since working w/ our awesome APT staff. Had recent PRP injection & is looking forward to getting back into her exercises. Has also started Ozempic for weight loss too which is seeming to help. She is interested in continuing skilled APT, especially now that she has started Ozempic and has had her PRP injection.       **Pt was late to today's appointment which limited amount of time spent face to face for this re-eval/status update.**    Occupation and Activities  Work status: Does not work  Job title/type of work: [employer]  Stresses/physical demands of job: NA  Stresses/physical demands of home: Self Care, Housekeeping, Gardening/Yard Work, Presenter, broadcasting, and Sports -- able to tolerate these things more now compared to initially.       Symptom location: Medial and Anterior, bilateral  Relevant symptoms:  Pain , Decreased ROM, Decreased strength  Symptom frequency: Persistent  Symptoms worsen with: Kneeling, Stairs, Weight bearing, Standing, Walking  Symptoms improve with: Medication  Assistive device:  none  Patient's goals for therapy: Perform ambulation at safe level with or without assistive device to promote safe community activity/ social function (shopping,  attendance at appointments/events), Reduce pain, Achieve independence with home program for self care / condition management, Return to sports / activities    ROM/Strength    HIP LEFT RIGHT STRENGTH    PROM AROM PROM AROM Left Right   Flexion     4+ 4+   Extension     4+ 4+   Abduction     4 4   IR         ER         Adduction                        KNEE LEFT RIGHT STRENGTH    PROM AROM PROM AROM Left Right   Flexion  120  120 4 4   Extension  2 HE  2 HE 4 4                30s STS Test: 20 reps-- no pain! --> significant improvement!      Functional:  Stand more than 20 minutes, -  able  to perform.  Sit more than 20 minutes - able to perform.  Walk more than 20 minutes -  able  to perform; still some fatigue w/ longer times/longer distances.  Ascend stairs with reciprocal gait -  able  to perform; still gets tight/sore if she does a lot.   Descend stairs with reciprocal gait - able to perform.  Attain prayer position 5 times a day for a total time of 7 minutes - improving; but  still not 100% back to normal    Assessment:    Findings consistent with 47 y.o. F with Chronic Bilateral Knee pain with pain, ROM limitations, strength limitations, functional limitations. Patient would continue to benefit from skilled aquatic therapy to address identified impairments, functional limitations, and pain. Patient currently unable to tolerate land-based intervention and would benefit from therapy in the aquatic environment for greater mobility, decreased joint compression and reduced weight bearing forces. Will transition patient to land-based therapy or independent community program as appropriate.    Personal factors affecting treatment/recovery:  none identified  Comorbidities affecting treatment/recovery:  none noted  Clinical presentation:  stable  Patient complexity:    low level as indicated by above stability of condition, personal factors, environmental factors and comorbidities in addition to patient symptom  presentation and impairments found on physical exam.    Contraindications to Aquatic Therapy Reviewed: Yes    Contagious water or air-borne infection/disease  Open wounds/incisions  Bowel and/or bladder incontinence  Diarrhea and/or vomiting   Infection/fever      Short Term Goals: (3 week(s)): Decrease c/o max pain to < 4/10 and Minimal assistance with HEP/ education concepts  Long Term Goals: (6 week(s)): Pain/Sx 0 - minimal, ROM/ flexibility WNL , Restoration of functional strength, Independent with HEP/education , Functional return to ADLs / activities without limitations    Treatment Plan:   Options / plan reviewed with patient:  Yes  Freq 1 times per week for 6 week(s)    Treatment plan inclusive of:   Exercise: Aquatic Physical Therapy Including AROM, Stretching, Strengthening, Progressive Resistive, Aerobic exercise   Functional: Functional rehab, Endurance training, Self-care, Home management      Thank you for referring this patient to [facility] Orthopedic Sports and Spine Rehabilitation.    Joseph Berkshire PT, DPT, OCS     LE 3 x 10   STS 3 x 10   Step up 3 x 10   Heel slide 30x                                      Treatment Start Time 12:40pm   Treatment End Time 1pm   Total Minutes of treatment        Total Non-Treatment time (rest)    Total Service Based min of treatment    Total Time-Based minutes of treatment            Service-Based Procedures/ Modalities    Evaluation    Re-evaluation 20   Total Service Based Treatment        Time-Based Procedures / Modalities    Aquatic Therapy    Therapeutic ex 20   Neuromuscular Re-ed    Total Time-Based Treatment total          POC DATE: 09/25/22

## 2023-01-18 ENCOUNTER — Ambulatory Visit: Payer: BLUE CROSS/BLUE SHIELD | Attending: Orthopedic Surgery

## 2023-01-18 DIAGNOSIS — G8929 Other chronic pain: Secondary | ICD-10-CM | POA: Insufficient documentation

## 2023-01-18 DIAGNOSIS — M25561 Pain in right knee: Secondary | ICD-10-CM | POA: Insufficient documentation

## 2023-01-18 DIAGNOSIS — M170 Bilateral primary osteoarthritis of knee: Secondary | ICD-10-CM | POA: Insufficient documentation

## 2023-01-18 DIAGNOSIS — M25562 Pain in left knee: Secondary | ICD-10-CM | POA: Insufficient documentation

## 2023-01-18 NOTE — Patient Instructions (Signed)
The Patient was provided with the following items:  Device  Side: Bilateral  Size:: xl  Model:: rebel reliever  Manufacturer: Tamera Stands  Part Number: [id number]  Warranty:: 1 Year  Quantity: 2  Status: Delivered  [facility] Orthotics and Prosthetics      Your physician has determined that you require Prefabricated (off-the-shelf) Orthotic and Prosthetic (O&P) devices and/or Durable Medical Equipment (DME).  O&P devices include items such as braces for the spine or limbs, while DME includes items such as canes, crutches and walkers.  For your convenience you were fitted by the [facility] Department of Orthotics and Prosthetics.    Return Policy:    Prefabricated O&P and DME items are not returnable if used outside of clinic due to hygiene concerns.    Device cannot be returned or refunded.    O&P devices and DME purchased from the [facility] Orthotics and Prosthetics Department may only be exchanged if the item is faulty or poorly fitting, as determined by the O&P Department Clinical Coordinator.  Exchanges will be made using the same type of device, if within 7 days of the purchase date.    No exchange will be issued for items that were not used in accordance with the manufacturer's recommended standard of care.    Replacement or repair of minor parts could become necessary due to wear.  This may include a charge and an order from you physician may be required.

## 2023-01-18 NOTE — Progress Notes (Signed)
Walk-in Visit   Of [facility] Orthotics and Prosthetics  [phone]    Patient name: Eileen Bond   MRN: [id number]      ICD-10-CM ICD-9-CM   1. Chronic pain of both knees  M25.561 719.46    M25.562 338.29    G89.29    2. Primary osteoarthritis of both knees  M17.0 715.16       There were no vitals filed for this visit.    The Patient was provided with the following items:  Device  Side: Bilateral  Size:: xl  Model:: rebel reliever  Manufacturer: thusane  Part Number: [id number]  Warranty:: 1 Year  Quantity: 2  Status: Delivered    Functional Goals:   Offload medial    ROM settings: N/A    Expected Frequency / Duration of Use:   Daily or Per physician orders    Modifications made: No modifications were required at this time    Complexity:   Fitting was routine, requiring little to no adjustments    Yes, Device was inspected for safety/security and found to be functioning properly    Ordered/Delivered: Device Delivered    The patient given verbal and written instructions.  The following instructions were reviewed: Purpose of device  Cleaning / Care of device  Potential Risks / Benefits  Frequency / Duration of use  How to report potential failure / malfunctions            Alberteen Sam

## 2023-01-21 ENCOUNTER — Ambulatory Visit: Payer: BLUE CROSS/BLUE SHIELD

## 2023-01-21 ENCOUNTER — Other Ambulatory Visit: Payer: Self-pay

## 2023-01-21 ENCOUNTER — Ambulatory Visit: Payer: BLUE CROSS/BLUE SHIELD | Admitting: Orthopedic Surgery

## 2023-01-21 VITALS — BP 132/84 | Ht 65.5 in | Wt 212.0 lb

## 2023-01-21 DIAGNOSIS — M25562 Pain in left knee: Secondary | ICD-10-CM

## 2023-01-21 DIAGNOSIS — M170 Bilateral primary osteoarthritis of knee: Secondary | ICD-10-CM

## 2023-01-21 NOTE — Progress Notes (Signed)
Patient underwent ultrasound-guided right knee joint PRP injection using Angel Arthrex system today.  She tolerated the procedure well without any major complications.  Please see separate procedure note for the details of today's procedure.

## 2023-01-21 NOTE — Patient Instructions (Addendum)
Instructions (PRP Injection)    Today you received an injection of Platelet Rich Plasma (PRP) into an area of discomfort. Remember, the purpose of this injection is to provide long term functional improvement; you may not experience immediate pain relief.     Activity:  - You may drive and return to work immediately following your injection.   - You should avoid strenuous physical activity for 48 hours after your injection, then return to your usual activities as tolerated. This includes any restrictions your physician may have prescribed.     Injection Site Care:  - You may ice the injection site to help discomfort.   - Remove Band-Aid(s) as soon as any local bleeding has stopped.   - Patients with dark skin may experience blanching (lightening) at the injection site.    Medications:  - Non-steroidal anti-inflammatory drugs (NSAIDs; ibuprofen, Advil, Aleve) should be discontinued for 7 days following your injection.     Additional Instructions:   - You may experience flu-like symptoms 48-72 hours after the injection. This represents the bodys natural healing response to the injection and should resolve shortly thereafter.     Post Injection Follow-up:  - Call the office [phone]) 1 month following your injection to report your level of pain/symptom relief. (If a series of injections is given, please call 1 month following the final injection of the series) We ask that you assign a percentage to your level of improvement- i.e.: 50% improvement in pain.     If you have any questions, please contact the office [phone]). If you have concerns after office hours, please call our answering service [phone]).

## 2023-01-21 NOTE — Progress Notes (Signed)
Patient came in to [facility] for an appointment to have an Angel PRP injection done into her right knee. Blood drawn using aseptic technique in the right AC - 90 cc blood, spun using Angel machine and obtained 3 cc PRP to be injected back into the right knee by Dr. Azell Der using ultrasound guidance. Patient tolerated well, no issues/complaints. Patient ambulated to check out.  Verbal and written instructions given and provided with the patient's After Visit Summary.  Refer to provider procedure note.      Ellwood Dense, RN

## 2023-01-21 NOTE — Procedures (Signed)
Large Joint Aspiration/Injection Procedure: R knee intra - articular    Date/Time: 01/21/2023 10:00 AM EDT  Consent given by: patient  Site marked: site marked  Timeout: Immediately prior to procedure a time out was called to verify the correct patient, procedure, equipment, support staff and site/side marked as required     Procedure Details    Location: knee - R knee intra - articular  Preparation: The site was prepped using the usual aseptic technique.  PRP injection: Angel  [employer], self pay:  Administered     Medication: 3 cc PRP, Angel  Dressing:  A dry, sterile dressing was applied.  Patient tolerance: patient tolerated the procedure well with no immediate complications

## 2023-01-29 ENCOUNTER — Ambulatory Visit: Payer: BLUE CROSS/BLUE SHIELD

## 2023-01-30 ENCOUNTER — Other Ambulatory Visit: Payer: Self-pay | Admitting: Gastroenterology

## 2023-02-07 ENCOUNTER — Other Ambulatory Visit: Payer: Self-pay

## 2023-02-07 ENCOUNTER — Ambulatory Visit: Payer: BLUE CROSS/BLUE SHIELD | Admitting: Physical Therapy

## 2023-02-07 DIAGNOSIS — G8929 Other chronic pain: Secondary | ICD-10-CM | POA: Insufficient documentation

## 2023-02-07 DIAGNOSIS — M25561 Pain in right knee: Secondary | ICD-10-CM | POA: Insufficient documentation

## 2023-02-07 DIAGNOSIS — M25562 Pain in left knee: Secondary | ICD-10-CM | POA: Insufficient documentation

## 2023-02-07 NOTE — Progress Notes (Signed)
[facility] Orthopedic Sports/Spine Rehabilitation  PT Treatment Note      Name: Eileen Bond  DOB: December 14, 1975  Referring Physician: Joan Flores, NP  Diagnosis:   1. Bilateral chronic knee pain            Subjective:  Pain Assessment: 1/10  Patient reported she received a PRP injection in R knee last week and noted that was why she missed her last treatment. Patient reported knee pain was low and overall has improved. Patient noted an improvement in repetitive knee flexion but has not attempted a full squat down to the ground yet.      Objective:  Strength - Therapeutic Exercises per flowsheet  Function: -  Increased pain with sitting/kneeling and especially praying  Education:  Verbal cues for ther ex, Gait training, Joint ed concepts    Objective      Water Walking (Fwd/Bkwd) 8 min Combo   Side Stepping 5 min   Calf Raises x30   Standing Calf Stretch 3 x 20" ea B   Mini Squats x30   Hip Program (Flex, Ext, Abd) x20 ea B   SLB 3 x 20" B   HS Curls x20 B   Knee Flexion Stretch @ Stairs 5 x 10" B   HS Stretch @ Stairs 3 x 30" B   Noodle Pushdowns x20 B   March Walking 3 min   Step Ups x15 B       Deep Well    Bicycle 5 min       Treatment:  Aquatic therapy    Assessment:   Patient tolerated aquatic session well with no increase in pain. Patient was able to do her step ups today with no UE support.        Plan of Care:  Continue per Plan of care -  As written; Patient would benefit from skilled rehabilitation services to address the above impairments to restore functional capacity.    Thank you for referring this patient to [facility]opa[facility] - Sports and Spine Rehabilitation    Namon Cirri, [place]              Treatment Start Time 9:35 am   Treatment End Time 10:25 am   Total Minutes of treatment 50 min         Total Non-Treatment time (rest)     Total Service Based min of treatment    Total Time-Based minutes of treatment 50 min               Service-Based Procedures/ Modalities      Evaluation    Re-evaluation     Total Service Based Treatment          Time-Based Procedures / Modalities     Aquatic Therapy 50 min   Therapeutic ex     Neuromuscular Re-ed     Total Time-Based Treatment 3              POC DATE: 01/16/2023

## 2023-02-12 ENCOUNTER — Telehealth: Payer: Self-pay

## 2023-02-12 DIAGNOSIS — D6851 Activated protein C resistance: Secondary | ICD-10-CM

## 2023-02-12 NOTE — Telephone Encounter (Signed)
Spoke to patient. She reports she always sees Hematology due to the Factor V Leiden and clotting issues but has not seen anyone since she moved from [place]. Whomever you recommend .

## 2023-02-12 NOTE — Telephone Encounter (Signed)
Patient called and left a message stating she forgot to ask dr Karlene Einstein for a referral for hematology. Please advise

## 2023-02-13 ENCOUNTER — Telehealth: Payer: Self-pay

## 2023-02-13 ENCOUNTER — Telehealth: Payer: Self-pay | Admitting: Hematology

## 2023-02-13 NOTE — Progress Notes (Signed)
Hematology New Patient Pathway     Eileen Bond  Age: 47 y.o.  DOB: 11-18-1975  MRN: [id number]  [address]  [place] [address]  Preferred language: ENGLISH      REGISTRATION:    Date of referral: 02/13/2023   Reason for Referral: Establish Care   Type of referral: Internal   Referring provider: PCP Dr. Rogue Jury   Reason for referral: FVL   Assessment:   06/20/22 Derm OV note  02/12/23 PCP Tele enc CHART REVIEW  04/09/22 PCP Ref doc  04/10/22 PCP OV note MEDIA     Labs: RESULTS REVIEW    No FVL testing found     Imaging: CHART REVIEW Image      Nurse Navigator encounter type: Chart Review      Current Outpatient Medications on File Prior to Visit:  metFORMIN 500 mg tablet, Take 1 tablet (500 mg total) by mouth daily (with breakfast)., Disp: , Rfl:   rosuvastatin 20 mg tablet, Take 0.5 tablets (10 mg total) by mouth daily., Disp: , Rfl:   tacrolimus (PROTOPIC) 0.1 % ointment, Apply topically 2 times daily. to the affected area of the lower leg Monday through Friday, Disp: 60 g, Rfl: 3  betamethasone diprop augmented (DIPROLENE-AF) 0.05 % ointment, Apply topically 2 times daily. to the rash on the lower leg on the weekends Saturday and Sunday., Disp: 50 g, Rfl: 2    No current facility-administered medications on file prior to visit.      No past medical history on file.    Time spent on this encounter (in minutes): 8

## 2023-02-13 NOTE — Telephone Encounter (Signed)
Writer spoke to patient who reports she is leaving to go out of town on 12/23 and will be returning the first week of January. She will need 12/24 appt canceled    Writer will have nurse navigator review schedule for a sooner appt. Writer encouraged pt to discuss symptoms with PCP. Pt confirmed she spoke to PCP office earlier today

## 2023-02-13 NOTE — Telephone Encounter (Signed)
Does she need a sooner appt?

## 2023-02-13 NOTE — Telephone Encounter (Signed)
Per Dr Karlene Einstein for an establish care appointment there would not be a sooner appt. Call to patient. She has an appt scheduled on 03/26/23 and she will be leaving for out of town that day. She reports that she is having numbness on the L side leg and arm , the clot affected side is why she would like a sooner appt. Called [facility], spoke to [place], she will put a message through to the scheduling team and call our office back. Patient wanted to make you aware of the numbness . Will await call back from [facility].

## 2023-02-13 NOTE — Telephone Encounter (Signed)
Patient called and left a message stating she did get a call from hematology, and they advised that they cold not see her until 2025, BUT did have one earlier appointment that they could only give to her if our office called them explaining why she needs sooner

## 2023-02-13 NOTE — Telephone Encounter (Signed)
Patient called regarding 11/13 referral - Advise pt next available for a hematology referral is 04/10/23    Pt states she needs earlier appt as she is traveling and needs to be seen prior to the end of the year.    Advised pt have her referring provider call here and have a conversation with our providers and then we may be able to see her sooner    Pt expressed understanding

## 2023-02-14 ENCOUNTER — Ambulatory Visit: Payer: BLUE CROSS/BLUE SHIELD

## 2023-02-14 ENCOUNTER — Other Ambulatory Visit: Payer: Self-pay

## 2023-02-14 DIAGNOSIS — M25561 Pain in right knee: Secondary | ICD-10-CM

## 2023-02-14 NOTE — Telephone Encounter (Signed)
Patient notified of DR Derafsh recommendations.

## 2023-02-14 NOTE — Progress Notes (Signed)
[facility] Orthopedic Sports/Spine Rehabilitation  PT Treatment Note      Name: Eileen Bond  DOB: 1975-09-06  Referring Physician: Joan Flores, NP  Diagnosis:   1. Bilateral chronic knee pain            Subjective:  Pain Assessment: 1/10  Patient reports that she feels very good today with no pain noted.     Objective:  Strength - Therapeutic Exercises per flowsheet  Function: -  Increased pain with sitting/kneeling and especially praying  Education:  Verbal cues for ther ex, Gait training, Joint ed concepts    Objective      Water Walking (Fwd/Bkwd) 8 min Combo   Side Stepping 5 min   Calf Raises x30   Standing Calf Stretch 3 x 20" ea B   Mini Squats x30   Hip Program (Flex, Ext, Abd) x20 ea B   SLB 3 x 20" B   HS Curls x20 B   Knee Flexion Stretch @ Stairs 5 x 10" B   HS Stretch @ Stairs 3 x 30" B   Noodle Pushdowns x20 B   March Walking 3 min   Step Ups x15 B       Deep Well    Bicycle 5 min       Treatment:  Aquatic therapy    Assessment:   Patient tolerated aquatic session well.       Plan of Care:  Continue per Plan of care -  As written; Patient would benefit from skilled rehabilitation services to address the above impairments to restore functional capacity.    Thank you for referring this patient to [facility] Orthopaedics - Sports and Spine Rehabilitation    Francene Finders, ATC,PTA              Treatment Start Time 9:45 am   Treatment End Time 10:40 am   Total Minutes of treatment 55 min         Total Non-Treatment time (rest)     Total Service Based min of treatment    Total Time-Based minutes of treatment 55 min               Service-Based Procedures/ Modalities     Evaluation    Re-evaluation     Total Service Based Treatment          Time-Based Procedures / Modalities     Aquatic Therapy 55 min   Therapeutic ex     Neuromuscular Re-ed     Total Time-Based Treatment 4              POC DATE: 01/16/2023

## 2023-02-19 ENCOUNTER — Other Ambulatory Visit: Payer: Self-pay

## 2023-02-19 ENCOUNTER — Ambulatory Visit: Payer: BLUE CROSS/BLUE SHIELD

## 2023-02-19 DIAGNOSIS — M25562 Pain in left knee: Secondary | ICD-10-CM

## 2023-02-19 NOTE — Progress Notes (Signed)
[facility] Orthopedic Sports/Spine Rehabilitation  PT Treatment Note      Name: Eileen Bond  DOB: [birth year]  Referring Physician: Joan Flores, NP  Diagnosis:   1. Bilateral chronic knee pain            Subjective:  Pain Assessment: 0/10  Patient continues to report no pain. She does note some fatigue today.     Objective:  Strength - Therapeutic Exercises per flowsheet  Function: -  Increased pain with sitting/kneeling and especially praying  Education:  Verbal cues for ther ex, Gait training, Joint ed concepts    Objective      Water Walking (Fwd/Bkwd) 8 min Combo   Side Stepping 5 min   Calf Raises x30   Standing Calf Stretch 3 x 20" ea B   Mini Squats x30   Hip Program (Flex, Ext, Abd) x20 ea B   SLB 3 x 20" B   HS Curls x20 B   Knee Flexion Stretch @ Stairs 5 x 10" B   HS Stretch @ Stairs 3 x 30" B   Noodle Pushdowns x20 B   March Walking 3 min   Step Ups x15 B       Deep Well    Bicycle 5 min   *Only bolded exercises completed today    Treatment:  Aquatic therapy    Assessment:   Patient tolerated aquatic session well. Patient received a call from her child's school and needed to leave session early. Exercises completed today are in bold above.       Plan of Care:  Continue per Plan of care -  As written; Patient would benefit from skilled rehabilitation services to address the above impairments to restore functional capacity.    Thank you for referring this patient to [facility] Orthopaedics - Sports and Spine Rehabilitation    Francene Finders, ATC,PTA              Treatment Start Time 9:45 am   Treatment End Time 10:10am   Total Minutes of treatment 25 min         Total Non-Treatment time (rest)     Total Service Based min of treatment    Total Time-Based minutes of treatment 25 min               Service-Based Procedures/ Modalities     Evaluation    Re-evaluation     Total Service Based Treatment          Time-Based Procedures / Modalities     Aquatic Therapy 25 min   Therapeutic ex      Neuromuscular Re-ed     Total Time-Based Treatment 2              POC DATE: 01/16/2023

## 2023-02-21 ENCOUNTER — Ambulatory Visit: Payer: BLUE CROSS/BLUE SHIELD

## 2023-02-21 ENCOUNTER — Other Ambulatory Visit: Payer: Self-pay

## 2023-02-21 DIAGNOSIS — G8929 Other chronic pain: Secondary | ICD-10-CM

## 2023-02-21 NOTE — Progress Notes (Signed)
[facility] Orthopedic Sports/Spine Rehabilitation  PT Treatment Note      Name: Eileen Bond  DOB: [birth year]  Referring Physician: Joan Flores, NP  Diagnosis:   1. Bilateral chronic knee pain            Subjective:  Pain Assessment: 3/10  Patient reports that her knees were painful yesterday and she had to take 1 IBP prior to her aquatic session today.     Objective:  Strength - Therapeutic Exercises per flowsheet  Function: -  Increased pain with sitting/kneeling and especially praying  Education:  Verbal cues for ther ex, Gait training, Joint ed concepts    Objective      Water Walking (Fwd/Bkwd) 8 min Combo   Side Stepping 5 min   Calf Raises x30   Standing Calf Stretch 3 x 20" ea B   Mini Squats x30   Hip Program (Flex, Ext, Abd) x20 ea B   SLB 3 x 20" B   HS Curls x20 B   Knee Flexion Stretch @ Stairs 5 x 10" B   HS Stretch @ Stairs 3 x 30" B   Noodle Pushdowns x20 B   March Walking 3 min   Step Ups x15 B       Deep Well    Bicycle 5 min       Treatment:  Aquatic therapy    Assessment:   Patient tolerated aquatic session well.        Plan of Care:  Continue per Plan of care -  As written; Patient would benefit from skilled rehabilitation services to address the above impairments to restore functional capacity.    Thank you for referring this patient to [facility] Orthopaedics - Sports and Spine Rehabilitation    Francene Finders, ATC,PTA              Treatment Start Time 10:00am   Treatment End Time 11:00am   Total Minutes of treatment 60 min         Total Non-Treatment time (rest)     Total Service Based min of treatment    Total Time-Based minutes of treatment 60 min               Service-Based Procedures/ Modalities     Evaluation    Re-evaluation     Total Service Based Treatment          Time-Based Procedures / Modalities     Aquatic Therapy 60 min   Therapeutic ex     Neuromuscular Re-ed     Total Time-Based Treatment 4              POC DATE: 01/16/2023

## 2023-02-25 ENCOUNTER — Ambulatory Visit: Payer: BLUE CROSS/BLUE SHIELD

## 2023-03-06 ENCOUNTER — Other Ambulatory Visit: Payer: Self-pay

## 2023-03-06 ENCOUNTER — Ambulatory Visit: Payer: BLUE CROSS/BLUE SHIELD | Admitting: Physical Therapy

## 2023-03-06 DIAGNOSIS — M25562 Pain in left knee: Secondary | ICD-10-CM | POA: Insufficient documentation

## 2023-03-06 DIAGNOSIS — G8929 Other chronic pain: Secondary | ICD-10-CM | POA: Insufficient documentation

## 2023-03-06 DIAGNOSIS — M25561 Pain in right knee: Secondary | ICD-10-CM | POA: Insufficient documentation

## 2023-03-06 NOTE — Progress Notes (Signed)
Sent via: eRecord EMR INBASKET    Physician attestation for [facility] Plan of Care: Physician/NP/PA:  Joan Flores, NP  Per signature, I have reviewed and agree with the documented plan of care.    ___________________________________________________________    Please sign this Medicare plan of care for outpatient therapy treatment as required by Medicare. If you have any questions, please contact us at [phone]. We appreciate your prompt attention to this request.    Sincerely,   [facility]       [facility] ORTHOPEDIC SPORTS+SPINE REHABILITATION   AQUATIC THERAPY EVALUATION      Diagnosis: Chronic B knee pain  Onset date:  Chronic  Date of surgery: NA      Subjective:   Pt has notes continued improvement w/ her knees since working w/ our awesome APT staff. Feels she is doing so much better & functioning much better. Based on subjective & objective progress, we're going to progress to 1 aqua & 1 land-based PT session; alternating weeks.       **Pt was late to today's appointment which limited amount of time spent face to face for this re-eval/status update.**    Occupation and Activities  Work status: Does not work  Job title/type of work: [employer]  Stresses/physical demands of job: NA  Stresses/physical demands of home: Self Care, Housekeeping, Gardening/Yard Work, Presenter, broadcasting, and Sports -- able to tolerate these things more now compared to initially.       Symptom location: Medial and Anterior, bilateral  Relevant symptoms:  Pain , Decreased ROM, Decreased strength  Symptom frequency: Persistent  Symptoms worsen with: Kneeling, Stairs, Weight bearing, Standing, Walking  Symptoms improve with: Medication  Assistive device:  none  Patient's goals for therapy: Perform ambulation at safe level with or without assistive device to promote safe community activity/ social function (shopping, attendance at appointments/events), Reduce pain, Achieve independence with home program for  self care / condition management, Return to sports / activities    ROM/Strength      KNEE LEFT RIGHT STRENGTH    PROM AROM PROM AROM Left Right   Flexion  130  130 5 5   Extension  2 HE  2 HE 5 5                30s STS Test: 20 reps-- no pain! --> significant improvement!      Functional:  Stand more than 20 minutes, -  able  to perform.  Sit more than 20 minutes - able to perform.  Walk more than 20 minutes -  able  to perform; still some fatigue w/ longer times/longer distances.  Ascend stairs with reciprocal gait -  able  to perform; still gets tight/sore if she does a lot.   Descend stairs with reciprocal gait - able to perform.  Attain prayer position 5 times a day for a total time of 7 minutes - improving; but still not 100% back to normal    Assessment:    Findings consistent with 47 y.o. F with Chronic Bilateral Knee pain with pain, ROM limitations, strength limitations, functional limitations. Patient would continue to benefit from skilled aquatic therapy to address identified impairments, functional limitations, and pain. Based on progress though, we're going to mix in some land-based treatment sessions in conjunction w/ aqua PT in order to further maximize therapeutic benefit(s).     Personal factors affecting treatment/recovery:  none identified  Comorbidities affecting treatment/recovery:  none noted  Clinical presentation:  stable  Patient complexity:    low level as indicated by above stability of condition, personal factors, environmental factors and comorbidities in addition to patient symptom presentation and impairments found on physical exam.    Short Term Goals: (3 week(s)): Decrease c/o max pain to < 4/10 and Minimal assistance with HEP/ education concepts  Long Term Goals: (6 week(s)): Pain/Sx 0 - minimal, ROM/ flexibility WNL , Restoration of functional strength, Independent with HEP/education , Functional return to ADLs / activities without limitations    Treatment Plan:   Options / plan  reviewed with patient:  Yes  Freq 1 times per week for 6 week(s)    Treatment plan inclusive of:   Exercise: Aquatic Physical Therapy Including AROM, Stretching, Strengthening, Progressive Resistive, Aerobic exercise   Functional: Functional rehab, Endurance training, Self-care, Home management      Thank you for referring this patient to [facility] Orthopedic Sports and Spine Rehabilitation.    Joseph Berkshire PT, DPT, OCS     LE 3 x 10   STS 3 x 10   Step up 3 x 10   Heel slide 30x                                      Treatment Start Time 10:50am   Treatment End Time 11:10am   Total Minutes of treatment 20        Total Non-Treatment time (rest)    Total Service Based min of treatment    Total Time-Based minutes of treatment            Service-Based Procedures/ Modalities    Evaluation    Re-evaluation 20   Total Service Based Treatment        Time-Based Procedures / Modalities    Aquatic Therapy    Therapeutic ex 20   Neuromuscular Re-ed    Total Time-Based Treatment total          POC DATE: 09/25/22

## 2023-03-12 ENCOUNTER — Other Ambulatory Visit
Admission: RE | Admit: 2023-03-12 | Discharge: 2023-03-12 | Disposition: A | Discharge status: Home / Self Care | Payer: BLUE CROSS/BLUE SHIELD | Source: Ambulatory Visit | Attending: Primary Care | Admitting: Primary Care

## 2023-03-12 DIAGNOSIS — E119 Type 2 diabetes mellitus without complications: Secondary | ICD-10-CM | POA: Insufficient documentation

## 2023-03-12 DIAGNOSIS — M1990 Unspecified osteoarthritis, unspecified site: Secondary | ICD-10-CM | POA: Insufficient documentation

## 2023-03-12 LAB — VITAMIN B12: Vitamin B12: 573 pg/mL (ref 232–1245)

## 2023-03-12 LAB — COMPREHENSIVE METABOLIC PANEL
ALT: 28 U/L (ref 0–35)
AST: 26 U/L (ref 0–35)
Albumin: 4.4 g/dL (ref 3.5–5.2)
Alk Phos: 85 U/L (ref 35–105)
Anion Gap: 11 (ref 7–16)
Bilirubin,Total: 1.2 mg/dL (ref 0.0–1.2)
CO2: 27 mmol/L (ref 20–28)
Calcium: 9.4 mg/dL (ref 8.6–10.2)
Chloride: 101 mmol/L (ref 96–108)
Creatinine: 0.82 mg/dL (ref 0.51–0.95)
Glucose: 83 mg/dL (ref 60–99)
Lab: 8 mg/dL (ref 6–20)
Potassium: 4.2 mmol/L (ref 3.3–5.1)
Sodium: 139 mmol/L (ref 133–145)
Total Protein: 7.7 g/dL (ref 6.3–7.7)
eGFR BY CREAT: 88 *

## 2023-03-12 LAB — CBC AND DIFFERENTIAL
Baso # K/uL: 0 10*3/uL (ref 0.0–0.2)
Eos # K/uL: 0.1 10*3/uL (ref 0.0–0.5)
Hematocrit: 46 % (ref 34–49)
Hemoglobin: 14.7 g/dL (ref 11.2–16.0)
IMM Granulocytes #: 0 10*3/uL (ref 0.0–0.0)
IMM Granulocytes: 0.1 %
Lymph # K/uL: 2.8 10*3/uL (ref 1.0–5.0)
MCV: 96 fL (ref 75–100)
Mono # K/uL: 0.4 10*3/uL (ref 0.1–1.0)
Neut # K/uL: 4.2 10*3/uL (ref 1.5–6.5)
Nucl RBC # K/uL: 0 10*3/uL (ref 0.0–0.0)
Nucl RBC %: 0 /100{WBCs} (ref 0.0–0.2)
Platelets: 221 10*3/uL (ref 150–450)
RBC: 4.8 MIL/uL (ref 4.0–5.5)
RDW: 12 % (ref 0.0–15.0)
Seg Neut %: 55.4 %
WBC: 7.5 10*3/uL (ref 3.5–11.0)

## 2023-03-12 LAB — MICROALBUMIN, URINE, RANDOM
Creatinine,UR: 201 mg/dL (ref 20–300)
Microalb/Creat Ratio: 12.9 mg MA/g CR (ref 0.0–29.9)
Microalbumin,UR: 2.59 mg/dL

## 2023-03-12 LAB — LIPID PANEL
Chol/HDL Ratio: 4.8
Cholesterol: 256 mg/dL — AB
HDL: 53 mg/dL (ref 40–60)
LDL Calculated: 179 mg/dL — AB
Non HDL Cholesterol: 203 mg/dL
Triglycerides: 119 mg/dL

## 2023-03-12 LAB — INSULIN, TOTAL: Insulin: 23 u[IU]/mL (ref 3–25)

## 2023-03-13 LAB — RHEUMATOID FACTOR,SCREEN: Rheumatoid Factor: <8 [IU]/mL

## 2023-03-13 LAB — CYCLIC CITRULLINATED PEPTIDE: Cyclic Citrullin Peptide Ab: <0.5 U/mL (ref 0.0–2.9)

## 2023-03-13 LAB — HEMOGLOBIN A1C: Hemoglobin A1C: 5.9 % — ABNORMAL HIGH

## 2023-03-13 LAB — ANTINUCLEAR ANTIBODY SCREEN: ANA Screen: NEGATIVE

## 2023-03-15 LAB — GLUTAMIC ACID DECARBOXYLASE: Glutamic Acid Decarboxlase: <5 [IU]/mL (ref 0.0–5.0)

## 2023-03-18 ENCOUNTER — Ambulatory Visit: Payer: BLUE CROSS/BLUE SHIELD | Admitting: Primary Care

## 2023-03-19 ENCOUNTER — Encounter: Payer: Self-pay | Admitting: Primary Care

## 2023-03-19 ENCOUNTER — Ambulatory Visit: Payer: BLUE CROSS/BLUE SHIELD | Attending: Primary Care | Admitting: Primary Care

## 2023-03-19 ENCOUNTER — Other Ambulatory Visit: Payer: Self-pay | Admitting: Primary Care

## 2023-03-19 ENCOUNTER — Other Ambulatory Visit: Payer: Self-pay

## 2023-03-19 VITALS — BP 122/80 | HR 74 | Temp 96.6°F | Resp 16 | Ht 65.5 in | Wt 210.6 lb

## 2023-03-19 DIAGNOSIS — R519 Headache, unspecified: Secondary | ICD-10-CM | POA: Insufficient documentation

## 2023-03-19 DIAGNOSIS — E785 Hyperlipidemia, unspecified: Secondary | ICD-10-CM | POA: Insufficient documentation

## 2023-03-19 DIAGNOSIS — D6851 Activated protein C resistance: Secondary | ICD-10-CM | POA: Insufficient documentation

## 2023-03-19 DIAGNOSIS — E119 Type 2 diabetes mellitus without complications: Secondary | ICD-10-CM | POA: Insufficient documentation

## 2023-03-19 DIAGNOSIS — L209 Atopic dermatitis, unspecified: Secondary | ICD-10-CM

## 2023-03-19 DIAGNOSIS — E559 Vitamin D deficiency, unspecified: Secondary | ICD-10-CM | POA: Insufficient documentation

## 2023-03-19 DIAGNOSIS — J309 Allergic rhinitis, unspecified: Secondary | ICD-10-CM | POA: Insufficient documentation

## 2023-03-19 DIAGNOSIS — R200 Anesthesia of skin: Secondary | ICD-10-CM | POA: Insufficient documentation

## 2023-03-19 DIAGNOSIS — E669 Obesity, unspecified: Secondary | ICD-10-CM | POA: Insufficient documentation

## 2023-03-19 HISTORY — DX: Atopic dermatitis, unspecified: L20.9

## 2023-03-19 MED ORDER — OZEMPIC (0.25 OR 0.5 MG/DOSE) 2 MG/3ML SC SOPN
0.5000 mg | PEN_INJECTOR | SUBCUTANEOUS | 1 refills | Status: DC
Start: 2023-03-19 — End: 2023-07-11

## 2023-03-19 NOTE — Progress Notes (Signed)
Please contact pt that after reviewing her file I do believe that she needs to be on long term anticoagulation with DOAC such as Xarelto or Eliquis. Semaglutides in general increase DVT risk.

## 2023-03-19 NOTE — Progress Notes (Signed)
 Will call pt regarding DOAC

## 2023-03-19 NOTE — Progress Notes (Signed)
[facility]    OUTPATIENT     OFFICE VISIT            No chief complaint on file.           47 year old female with PMH of HLD, diabetes mellitus type 2, bilateral arthritis of the knee factor V Leiden def seen for f/u     pt is asking for Lovenox when she travels    Patient reports doing well on Ozempic  She reports she has numbness of left side when she wakes up in the morning, this lasting for a little over 40 minutes. Reports occipital H/A since started Ozempic this also lasts 40 minutes.   She also reports pain in anterior aspect of forearm, worse when lifting heavy objects.       Review of Systems   Gastrointestinal:  Negative for abdominal pain, constipation, diarrhea, nausea and vomiting.   Neurological:  Positive for tingling and headaches.   All other systems reviewed and are negative.                    Examination:  Vitals  There were no vitals taken for this visit.  Patient entered home BP:        No data to display               Physical Exam  Constitutional:       Appearance: Normal appearance.   Neurological:      Mental Status: She is alert and oriented to person, place, and time. Mental status is at baseline.   Psychiatric:         Mood and Affect: Mood normal.         Thought Content: Thought content normal.               Assessment and Plan   Encounter Diagnoses   Name Primary?    Factor V Leiden Yes    Comment: Patient on Lovenox when travels.  I do recommend starting DOAC such as Lovenox or Eliquis     Hyperlipidemia, unspecified hyperlipidemia type     Comment: Patient was prescribed Atorvastatin 20 mg LDL significantly increased compared to last visit, was advised to take it every day     Obesity, unspecified class, unspecified obesity type, unspecified whether serious comorbidity present     Type 2 diabetes mellitus     Comment: on Ozempic and metformin A1c decreased down to prediabetic range, due to current symptoms will not increase the dose     Vitamin D deficiency     Left arm numbness      Comment: on and off, Factor V Leiden deficiency. also Ozempic increases risk of DVT, I do recommend DOAC.     Occipital headache     Comment: No headache at the moment usually this lasts anywhere from 20 to 40 minutes will do MRI if continues to happen, ER precautions discussed           No follow-ups on file.          (MyChart Activation Status- Activated [1])     Rogue Jury, MD   [facility] [place] [facility] INTERNAL MEDICINE  [address], [address]  [facility]  [place] [address]  Dept: [phone]  Dept Fax: [phone]

## 2023-03-20 ENCOUNTER — Ambulatory Visit: Payer: BLUE CROSS/BLUE SHIELD | Admitting: Physical Therapy

## 2023-03-20 ENCOUNTER — Ambulatory Visit: Payer: BLUE CROSS/BLUE SHIELD | Admitting: Primary Care

## 2023-03-21 ENCOUNTER — Telehealth: Payer: Self-pay

## 2023-03-21 LAB — PROINSULIN: Proinsulin: 3.5 pmol/L (ref <=7.2)

## 2023-03-21 MED ORDER — ENOXAPARIN SODIUM 40 MG/0.4ML IJ SOSY *I*
40.0000 mg | PREFILLED_SYRINGE | Freq: Every day | INTRAMUSCULAR | 1 refills | Status: DC
Start: 2023-03-21 — End: 2023-07-11

## 2023-03-21 NOTE — Telephone Encounter (Signed)
Patient notified

## 2023-03-21 NOTE — Telephone Encounter (Signed)
Med pended for Lovenox renewal. Any other recommendations other than just OTC meds to treat her symptoms?

## 2023-03-21 NOTE — Telephone Encounter (Signed)
Patient calling stating she has tested positive for covid, and her body is aching all over, and is starting to get congested and stuffy. Wants to know what to do, or what to take. Does she need to take the injections that she has in the past?

## 2023-03-21 NOTE — Telephone Encounter (Signed)
Patient called back, asking to get her lovenox injections refilled, as the one she has is almost expired

## 2023-03-25 ENCOUNTER — Ambulatory Visit: Payer: BLUE CROSS/BLUE SHIELD

## 2023-03-25 ENCOUNTER — Telehealth: Payer: Self-pay | Admitting: Hematology

## 2023-03-25 NOTE — Telephone Encounter (Signed)
Spoke to Eileen Bond regarding NPV for tomorrow. Dr. Sharlyn Bologna recommends rescheduling the appointment due to patient being symptomatic and it only being 8 days since the positive COVID test. Let patient know that clerical will reach out to get her appointment rescheduled.

## 2023-03-25 NOTE — Telephone Encounter (Signed)
Spoke to pt regarding NPV for tomorrow. She tested positive for COVID on Dec 15th. She denies fever/chills, body aches, cough. She is still experiencing severe congestion. Pt is questioning whether she should come for her appointment tomorrow. Patient is concerned because she started Ozempic 3 months ago and has been having occasional left sided numbness in the mornings that starts at her palm and spreads up throughout her neck, patient did notify her PCP about this numbness and they recommended seeing hematology or vascular for it. Notified patient that I will talk to Dr. Sharlyn Bologna regarding this and see what he recommends, writer will call patient back to discuss plan.

## 2023-03-26 ENCOUNTER — Ambulatory Visit: Payer: BLUE CROSS/BLUE SHIELD | Admitting: Hematology

## 2023-04-05 ENCOUNTER — Telehealth: Payer: Self-pay

## 2023-04-05 ENCOUNTER — Ambulatory Visit: Payer: BLUE CROSS/BLUE SHIELD | Admitting: Physical Therapy

## 2023-04-05 NOTE — Telephone Encounter (Signed)
 Patient called and left a message stating she went out of town, and forgot to give herself her ozempic dose before leaving, and will miss the week. Wanted to know if when she gets back home to take it if she should continue with current dosage or decrease it since she missed this week. Please advise

## 2023-04-11 ENCOUNTER — Ambulatory Visit: Payer: BLUE CROSS/BLUE SHIELD | Admitting: Physical Therapy

## 2023-04-15 ENCOUNTER — Ambulatory Visit: Payer: BLUE CROSS/BLUE SHIELD

## 2023-04-16 ENCOUNTER — Ambulatory Visit: Payer: BLUE CROSS/BLUE SHIELD | Admitting: Physical Therapy

## 2023-04-24 ENCOUNTER — Ambulatory Visit: Payer: BLUE CROSS/BLUE SHIELD | Admitting: Physical Therapy

## 2023-04-29 ENCOUNTER — Ambulatory Visit: Payer: BLUE CROSS/BLUE SHIELD

## 2023-04-30 ENCOUNTER — Telehealth: Payer: Self-pay

## 2023-04-30 NOTE — Telephone Encounter (Signed)
 Called patient to confirm appt patient did not answer did leave vm at 9:57 am on 04/30/23 .

## 2023-05-06 ENCOUNTER — Ambulatory Visit: Payer: BLUE CROSS/BLUE SHIELD

## 2023-05-07 ENCOUNTER — Other Ambulatory Visit: Payer: Self-pay

## 2023-05-07 ENCOUNTER — Ambulatory Visit: Payer: BLUE CROSS/BLUE SHIELD | Attending: Hematology | Admitting: Hematology

## 2023-05-07 ENCOUNTER — Encounter: Payer: Self-pay | Admitting: Hematology

## 2023-05-07 VITALS — BP 125/79 | HR 82 | Temp 96.6°F | Resp 21 | Ht 65.51 in | Wt 204.4 lb

## 2023-05-07 DIAGNOSIS — Z86718 Personal history of other venous thrombosis and embolism: Secondary | ICD-10-CM | POA: Insufficient documentation

## 2023-05-07 DIAGNOSIS — D6851 Activated protein C resistance: Secondary | ICD-10-CM | POA: Insufficient documentation

## 2023-05-07 DIAGNOSIS — I87002 Postthrombotic syndrome without complications of left lower extremity: Secondary | ICD-10-CM | POA: Insufficient documentation

## 2023-05-08 ENCOUNTER — Telehealth: Payer: Self-pay

## 2023-05-08 ENCOUNTER — Ambulatory Visit: Payer: BLUE CROSS/BLUE SHIELD | Admitting: Physical Therapy

## 2023-05-08 NOTE — Progress Notes (Signed)
History of Present Illness  The patient is being seen in the hematology clinic for a history of DVT and heterozygous factor V Leiden.    She experienced a deep vein thrombosis (DVT) in her left leg around 2009 or 2010 during pregnancy, which was managed with anticoagulation therapy. She was on Lovenox during the entire pregnancy, without any bleeding complications.     Genetic testing revealed a heterozygous factor V Leiden mutation.     She has not sought hematological consultation since relocating from [place]. She reports no other thrombotic events. She has not undergone a Doppler ultrasound but is uncertain about its necessity. She reports no excessive swelling. She reports no other health issues or concerns related to clotting or bleeding.     She does not smoke and has never consumed alcohol. She travels annually to visit family and other continents, typically involving 30 to 35-hour flights, during which she elevates her leg. Currently, she is on a regimen of baby aspirin, which she takes intermittently, particularly before travel. She carries Lovenox with her and administers it if she needs to sit for extended periods or during flights.    She has noticed numbness in her left leg upon waking up in the morning, a symptom that started approximately 1 to 2 months ago. The numbness lasts for about 15 to 20 minutes and resolves once she gets up. She is unsure if this could be a side effect of Ozempic, which she has been taking for 3.5 months and has resulted in a 20-pound weight loss. She believes her blood is thicker and her metabolism is low, which she hopes Ozempic will help with. She plans to go on a pilgrimage next year and wants to avoid using a wheelchair. She has discussed this symptom with her primary care physician.    She has observed that her left leg is consistently larger than her right, a condition that has persisted since her DVT diagnosis. She uses compression socks during travel.    She  has a hyperpigmented area on her skin, which appeared about 5 years ago following knee injections. She consulted 4 different dermatologists, but the condition has not improved. The area is not painful but can become irritated depending on clothing or weather conditions. She has tried various creams without success. A biopsy was performed, but no significant findings were reported. She discontinued the knee injections after the hyperpigmentation appeared and has since undergone PRP treatment, which has been beneficial. She received her first PRP treatment 6 years ago, resulting in an 85% recovery, and another one on her left side 2 months ago.    SOCIAL HISTORY  She does not smoke. She has never tried alcohol.    MEDICATIONS  aspirin, Lovenox, metformin, multivitamin, Ozempic, rosuvastatin, these are reviewed in the EMR.       Objective   Blood pressure 125/79, pulse 82, temperature 35.9 C (96.6 F), temperature source Temporal, resp. rate 21, height 166.4 cm (5' 5.51"), weight 92.7 kg (204 lb 6.4 oz), SpO2 100%.  Physical Exam  There is no edema in the musculoskeletal system. Small varicose veins are present underneath the skin, but they are not prominent or concerning.      Assessment & Plan  1. Deep Vein Thrombosis (DVT).  She had a DVT in her left leg in 2009-2010 and was on anticoagulant therapy for 2 years.     Currently, she takes 81 mg of aspirin intermittently, especially before travel, and carries Lovenox for long trips.  She has not experienced any new clots since the initial event.     She reports numbness in her left leg, which she attributes to Ozempic. The numbness occurs in the morning and lasts about 15-20 minutes.     She is advised to continue her current regimen of aspirin and Lovenox as needed. No additional testing, such as D-dimer or Doppler ultrasound, is necessary unless she experiences recurrent swelling, pain, or concerns about a new clot.    2. Heterozygous Factor V Leiden.  She was  diagnosed with heterozygous factor V Leiden following her DVT. She has been managing her condition with intermittent aspirin and Lovenox as needed. No changes to her current management plan are recommended.    3. Left Leg Numbness.  She reports numbness in her left leg, which she attributes to Ozempic. The numbness occurs in the morning and lasts about 15-20 minutes. It resolves once she gets up and moves around. She is advised to monitor the symptoms and report any changes.      Follow-up  The patient will follow up as needed.      Kirt Boys, MD  [facility]  [address]   [place] [address]  Phone: [phone] I Fax: [phone]      Author: Kirt Boys, MD  Note signed: 05/08/2023

## 2023-05-08 NOTE — Telephone Encounter (Signed)
Patient called and left a message stating her right eye has been bothering her for 2 days, and woke up with it being swollen and had discharge. Stated she would not see well through her eye. Wants to be seen. Please advise

## 2023-05-08 NOTE — Telephone Encounter (Signed)
Patient should come in for evaluation

## 2023-05-09 NOTE — Telephone Encounter (Signed)
Spoke to Dr Karlene Einstein, she advised if eye is swollen she should see her eye  DR. Called back to patient. Notified her. She also wants to mention two other issues. #1-She is taking Ozempic 0.5 mg and has no weight loss. Tolerating med well. Asking for an increase dose. #2- For the past 3 months she has had 3 episodes where after a BM she sees bright red blood on toilet tissues with wiping. Not straining, no constipation. Some pain with BM's. Wanted to know when to be concerned.

## 2023-05-09 NOTE — Telephone Encounter (Signed)
Could this patient be seen by Florentina Addison today?

## 2023-05-14 NOTE — Telephone Encounter (Signed)
Scheduled

## 2023-05-28 ENCOUNTER — Ambulatory Visit: Payer: BLUE CROSS/BLUE SHIELD | Admitting: Primary Care

## 2023-05-29 ENCOUNTER — Ambulatory Visit: Payer: BLUE CROSS/BLUE SHIELD | Attending: Primary Care | Admitting: Primary Care

## 2023-05-29 ENCOUNTER — Other Ambulatory Visit: Payer: Self-pay

## 2023-05-29 VITALS — BP 116/76 | HR 98 | Temp 97.5°F | Resp 16 | Ht 65.5 in | Wt 202.0 lb

## 2023-05-29 DIAGNOSIS — M1712 Unilateral primary osteoarthritis, left knee: Secondary | ICD-10-CM | POA: Insufficient documentation

## 2023-05-29 DIAGNOSIS — E559 Vitamin D deficiency, unspecified: Secondary | ICD-10-CM | POA: Insufficient documentation

## 2023-05-29 DIAGNOSIS — Z6832 Body mass index (BMI) 32.0-32.9, adult: Secondary | ICD-10-CM | POA: Insufficient documentation

## 2023-05-29 DIAGNOSIS — R200 Anesthesia of skin: Secondary | ICD-10-CM | POA: Insufficient documentation

## 2023-05-29 DIAGNOSIS — E785 Hyperlipidemia, unspecified: Secondary | ICD-10-CM | POA: Insufficient documentation

## 2023-05-29 DIAGNOSIS — E119 Type 2 diabetes mellitus without complications: Secondary | ICD-10-CM | POA: Insufficient documentation

## 2023-05-29 DIAGNOSIS — E669 Obesity, unspecified: Secondary | ICD-10-CM | POA: Insufficient documentation

## 2023-05-29 DIAGNOSIS — Z6833 Body mass index (BMI) 33.0-33.9, adult: Secondary | ICD-10-CM | POA: Insufficient documentation

## 2023-05-29 MED ORDER — METFORMIN HCL 1000 MG PO TABS *I*
1000.0000 mg | ORAL_TABLET | Freq: Two times a day (BID) | ORAL | 1 refills | Status: DC
Start: 2023-05-29 — End: 2023-11-28

## 2023-05-29 NOTE — Progress Notes (Signed)
 [name]    OUTPATIENT     OFFICE VISIT            Chief Complaint   Patient presents with    Other     Patient presents today having concerns the Ozempic she is on for weight loss is not helping or feels like she is not losing more weight and is at a stand still in weight. Patient would like to discuss increasing the Metformin to help with weight loss.             48 year old female with PMH of HLD, diabetes mellitus type 2, bilateral arthritis of the knee factor V Leiden def seen for f/u    previous encounter-----------------------------------  pt is asking for Lovenox when she travels     Patient reports doing well on Ozempic  She reports she has numbness of left side when she wakes up in the morning, this lasting for a little over 40 minutes. Reports occipital H/A since started Ozempic this also lasts 40 minutes.   She also reports pain in anterior aspect of forearm, worse when lifting heavy objects.   -----------------------------  05/29/23      History of Present Illness  The patient presents for a follow-up visit.    She reports a lack of significant weight loss despite being on Ozempic 0.5 mg, maintaining a consistent weight of 200 pounds since the initiation of the medication. She is not experiencing any symptoms of hypoglycemia, such as dizziness or nausea, although she has not been monitoring her blood glucose levels. She is currently observing Ramadan and is fasting. Her current regimen includes metformin 500 mg twice daily, taken once in the morning and once in the evening.    She has been experiencing numbness, which she attributes to her sleeping position and potential neck-related issues. She has made adjustments to her sleep posture, resulting in a decrease in numbness, although it has not completely resolved. She does not report any associated neck pain. A nerve conduction study has not been performed to date.    She is seeking a temporary handicap pass due to severe left knee pain, which is  exacerbated during grocery shopping trips. The pain has led to a fear of falling, limiting her mobility and outdoor activities.    MEDICATIONS  Current: Ozempic, metformin, rosuvastatin.         ROS                  Examination:  Vitals  BP 116/76   Pulse 98   Temp 36.4 C (97.5 F)   Resp 16   Ht 1.664 m (5' 5.5")   Wt 91.6 kg (202 lb) Comment: taken at home this morning  SpO2 98%   BMI 33.10 kg/m   Patient entered home BP:        No data to display               Physical Exam  Constitutional:       Appearance: Normal appearance.   Neurological:      Mental Status: She is alert and oriented to person, place, and time. Mental status is at baseline.   Psychiatric:         Mood and Affect: Mood normal.         Behavior: Behavior normal.         Judgment: Judgment normal.               Assessment and  Plan     Assessment & Plan  1. Diabetes mellitus.  She is currently on Ozempic 0.5 mg but has not achieved the desired weight loss. She reports no symptoms of low blood sugar, dizziness, or nausea. She is also taking metformin 500 mg twice daily. Due to her upcoming fasting period for Ramadan, the Ozempic dosage will not be increased at this time. Instead, the metformin dosage will be increased to 1000 mg twice daily. Blood work will be updated to monitor her condition. A follow-up appointment will be scheduled after Ramadan to reassess and potentially increase the Ozempic dosage to 1 mg.    2. Numbness.  She reports persistent numbness, which has improved with changes in sleeping position but has not completely resolved. A referral to a neurologist will be made for a nerve conduction study to determine the affected nerve.    3. Left knee pain.  She experiences significant pain in her left knee, which affects her mobility, especially during activities like grocery shopping. A temporary handicap parking permit will be issued to assist her during the current season.    Follow-up  The patient will follow up in 5  weeks.         Plan  Left arm numbness  Will ref to neurology for NCS    Hyperlipidemia   Patient has been compliant with her rosuvastatin previous LDL was 179 we will update labs in 1 months    Obesity   Patient has not achieved her desired weight loss with 0.5 mg of Ozempic Ozempic will be increased to 1 mg in 5 weeks since patient will be fasting due to religious reason  Will increase Metformin     Type 2 diabetes mellitus   Ozempic will be increased to 1 mg in 5 weeks since patient will be fasting due to religious reason  Will increase Metformin     BMI 33.0-33.9,adult  Weight loss of 5 to 7 % of body weight is our initial weight loss goal.  Weight loss of more than 5% can reduce risk factors for cardiovascular disease, such as dyslipidemia, hypertension, and diabetes mellitus.  -Get at least 150 minutes per week of moderate-intensity aerobic activity or 75 minutes per week of vigorous aerobic activity, or a combination of both, preferably spread throughout the week.  Add moderate - to high-intensity muscle-strengthening activity (such as resistance or weights) on at least 2 days per week.  -Follow low carb high protein diet.      Arthritis of left knee   Handicap parking permit form filled today             6 weeks with labs and med check          (MyChart Activation Status- Activated [1])     Rogue Jury, MD   [facility]  [address], [address]  [facility]  [place] [address]  Dept: [phone]  Dept Fax: [phone]

## 2023-05-29 NOTE — Assessment & Plan Note (Addendum)
 Patient has not achieved her desired weight loss with 0.5 mg of Ozempic Ozempic will be increased to 1 mg in 5 weeks since patient will be fasting due to religious reason  Will increase Metformin

## 2023-05-29 NOTE — Assessment & Plan Note (Signed)
 Handicap parking permit form filled today

## 2023-05-29 NOTE — Assessment & Plan Note (Addendum)
 Ozempic will be increased to 1 mg in 5 weeks since patient will be fasting due to religious reason  Will increase Metformin

## 2023-05-29 NOTE — Assessment & Plan Note (Signed)
 Patient has been compliant with her rosuvastatin previous LDL was 179 we will update labs in 1 months

## 2023-05-29 NOTE — Assessment & Plan Note (Signed)
 Will ref to neurology for NCS

## 2023-05-29 NOTE — Assessment & Plan Note (Signed)
 Weight loss of 5 to 7 % of body weight is our initial weight loss goal.  Weight loss of more than 5% can reduce risk factors for cardiovascular disease, such as dyslipidemia, hypertension, and diabetes mellitus.  -Get at least 150 minutes per week of moderate-intensity aerobic activity or 75 minutes per week of vigorous aerobic activity, or a combination of both, preferably spread throughout the week.  Add moderate - to high-intensity muscle-strengthening activity (such as resistance or weights) on at least 2 days per week.  -Follow low carb high protein diet.

## 2023-06-06 DIAGNOSIS — R200 Anesthesia of skin: Secondary | ICD-10-CM

## 2023-07-11 ENCOUNTER — Encounter: Payer: Self-pay | Admitting: Primary Care

## 2023-07-11 ENCOUNTER — Ambulatory Visit: Payer: BLUE CROSS/BLUE SHIELD | Attending: Primary Care | Admitting: Primary Care

## 2023-07-11 ENCOUNTER — Other Ambulatory Visit: Payer: Self-pay

## 2023-07-11 VITALS — BP 110/70 | HR 79 | Ht 65.5 in | Wt 202.0 lb

## 2023-07-11 DIAGNOSIS — E669 Obesity, unspecified: Secondary | ICD-10-CM | POA: Insufficient documentation

## 2023-07-11 DIAGNOSIS — E119 Type 2 diabetes mellitus without complications: Secondary | ICD-10-CM | POA: Insufficient documentation

## 2023-07-11 DIAGNOSIS — Z6833 Body mass index (BMI) 33.0-33.9, adult: Secondary | ICD-10-CM | POA: Insufficient documentation

## 2023-07-11 DIAGNOSIS — E782 Mixed hyperlipidemia: Secondary | ICD-10-CM | POA: Insufficient documentation

## 2023-07-11 MED ORDER — SEMAGLUTIDE (1 MG/DOSE) 2 MG/1.5ML SC SOPN *A*: 1.0000 mg | PEN_INJECTOR | SUBCUTANEOUS | 0 refills | Status: DC

## 2023-07-11 NOTE — Progress Notes (Signed)
 [name]    OUTPATIENT     OFFICE VISIT            Chief Complaint   Patient presents with    Follow-up                History of Present Illness  The patient presents for a follow-up on her Ozempic treatment.    She is currently on a regimen of Ozempic 0.5 mg, which she reports as being well-tolerated. She has been adhering to a diet of one meal per day, supplemented with breakfast and occasional sweets. Despite these efforts, she has not observed any significant weight loss. She initiated the Ozempic treatment in November 2024, starting with a dose of 0.25 mg before escalating to the current 0.5 mg. She is due for her next injection in 10 days and has a remaining supply of the 0.5 mg dose. She expresses a desire to achieve substantial weight loss, with the goal of being able to participate in the [place]aj[place] pilgrimage next year without the need for a wheelchair.    She also reports an improvement in her previously experienced numbness, attributing this to modifications in her sleeping position and the use of a specific pillow. She no longer experiences numbness upon waking. A neuro specialist friend suggested that her symptoms might be related to her neck, leading her to adjust her sleeping position and pillow height, which has resulted in a positive outcome.    Her last blood work was done in December 2024, which showed normal glucose levels but elevated cholesterol levels. She had missed some doses of rosuvastatin due to travel but is now back on her routine.    MEDICATIONS  Current: Ozempic, rosuvastatin         ROS                  Examination:  Vitals  There were no vitals taken for this visit.  Patient entered home BP:        No data to display               Physical Exam  Constitutional:       Appearance: Normal appearance.   Neurological:      Mental Status: She is alert and oriented to person, place, and time.   Psychiatric:         Mood and Affect: Mood normal.         Behavior: Behavior normal.          Thought Content: Thought content normal.         Judgment: Judgment normal.               Assessment and Plan     Assessment & Plan  1. Weight management.  Her weight remains stable at 202 pounds, with no significant changes observed. She is currently on Ozempic 0.5 mg and has been tolerating it well. The dosage of Ozempic will be increased to 1 mg, starting this Sunday. She is advised to utilize her remaining 0.5 mg supply by doubling the dose until it is depleted. A follow-up appointment is scheduled for 1 to 2 months from now, during which blood work will be conducted. If the increased dosage of Ozempic does not result in weight loss, alternative treatments such as Mounjaro may be considered.    2. Numbness.  She reports that her numbness has improved significantly by changing her sleeping position and using a specific pillow. No further intervention is  required at this time.    3. Elevated cholesterol.  Her cholesterol levels were very high in December 2024. She had missed some doses of rosuvastatin due to travel but is now back on her routine. Weight loss and adherence to medication are expected to improve her cholesterol levels. A repeat cholesterol test will be conducted during the follow-up appointment in 1 to 2 months.    Follow-up  The patient will follow up in 1 to 2 months.         Plan  Obesity   Patient has not achieved her desired weight loss with 0.5 mg of Ozempic Ozempic will be increased to 1 mg, will continue with Metformin     Type 2 diabetes mellitus   Ozempic 1 mg and Metformin   Will update labs     Hyperlipidemia   Previous LDL was above goal patient was not able to take her medication as planned  Will update labs     BMI 33.0-33.9,adult   Weight loss of 5 to 7 % of body weight is our initial weight loss goal.  Weight loss of more than 5% can reduce risk factors for cardiovascular disease, such as dyslipidemia, hypertension, and diabetes mellitus.  -Get at least 150 minutes per week of  moderate-intensity aerobic activity or 75 minutes per week of vigorous aerobic activity, or a combination of both, preferably spread throughout the week.  Add moderate - to high-intensity muscle-strengthening activity (such as resistance or weights) on at least 2 days per week.  -Follow low carb high protein diet.              1-2 months with labs          (MyChart Activation Status- Activated [1])     Rogue Jury, MD   [facility]  [address], [address]  [facility]  [place] [address]  Dept: [phone]  Dept Fax: [phone]

## 2023-07-11 NOTE — Assessment & Plan Note (Signed)
 Ozempic 1 mg and Metformin   Will update labs

## 2023-07-11 NOTE — Assessment & Plan Note (Signed)
 Patient has not achieved her desired weight loss with 0.5 mg of Ozempic Ozempic will be increased to 1 mg, will continue with Metformin

## 2023-07-11 NOTE — Assessment & Plan Note (Signed)
 Previous LDL was above goal patient was not able to take her medication as planned  Will update labs

## 2023-07-11 NOTE — Assessment & Plan Note (Signed)
 Weight loss of 5 to 7 % of body weight is our initial weight loss goal.  Weight loss of more than 5% can reduce risk factors for cardiovascular disease, such as dyslipidemia, hypertension, and diabetes mellitus.  -Get at least 150 minutes per week of moderate-intensity aerobic activity or 75 minutes per week of vigorous aerobic activity, or a combination of both, preferably spread throughout the week.  Add moderate - to high-intensity muscle-strengthening activity (such as resistance or weights) on at least 2 days per week.  -Follow low carb high protein diet.

## 2023-07-12 ENCOUNTER — Ambulatory Visit: Attending: Student in an Organized Health Care Education/Training Program

## 2023-07-12 ENCOUNTER — Ambulatory Visit: Admit: 2023-07-12 | Discharge: 2023-07-12 | Disposition: A | Discharge status: Home / Self Care

## 2023-07-12 ENCOUNTER — Other Ambulatory Visit: Payer: Self-pay | Admitting: Student in an Organized Health Care Education/Training Program

## 2023-07-12 DIAGNOSIS — M79602 Pain in left arm: Secondary | ICD-10-CM | POA: Insufficient documentation

## 2023-07-12 DIAGNOSIS — R202 Paresthesia of skin: Secondary | ICD-10-CM | POA: Insufficient documentation

## 2023-07-12 DIAGNOSIS — R200 Anesthesia of skin: Secondary | ICD-10-CM | POA: Insufficient documentation

## 2023-07-12 NOTE — Procedures (Signed)
 Exam location: [facility] Attending physician: Dr. Marshall Skeeter     Patient: Eileen Bond, Eileen Bond  Patient ID: [id number]      Date of Birth: [birth year] Height: 5'5" Gender: Female   Report ID: [id number] Exam Date: 07/12/2023     Referring Physician(s):  Dr. Allegra Arch   Provider(s):  Dr. Marshall Skeeter / Colie Dawes R. NCS.T      Referring Diagnosis:  Pain in left arm, left arm / Numbness   Final Diagnosis:  Pain in left arm, left arm / Numbness     Patient History   Eileen Bond is a 48 year old left handed woman who was referred by Dr. Allegra Arch for electrodiagnostic evidence of left upper extremity numbness and tingling.       For the past 2 months, she reports intermittent numbness and tingling involving her entire left arm down to her hand. The numbness occurs mainly in the morning upon awakening. She has had neck pain and discomfort. Initially the numbness occurred daily but now it is improving. She says she has been trying to sleep in better positions. She denies weakness.       A time out procedure was completed to verify the patient's identity including first and last name, date of birth and confirmed with id band. The correct procedure and site were reviewed with the patient.  Verbal consent was obtained prior to testing.  During performance of nerve conduction studies, distal limb temperature was maintained between 32 to 36 degrees Centigrade.     Clinical Examination   Negative Tinel's sign at the bilateral wrists or bilateral elbows   Negative Phalen's sign at the bilateral wrists       Motor: Normal bulk       Muscle:  R  L    Upper extremity    Shoulder abduction  5  5    Shoulder forward flexion  5  5    Elbow flexion  5  5    Elbow extension  5  5    Wrist flexion  5  5    Wrist extension  5  5    Finger extension  5  5    Finger spread  5  5    Thumb flexion (FPL)  5  5    Thumb abduction  5  5         Sensory: Pinprick sensation is reduced in the hands bilaterally but intact  above the wrists. Sensation to light touch is intact in bilateral upper extremities.      Reflexes:  R  L    Biceps  2  2    Triceps  2  2    Brachioradialis  2  2              Motor Nerve Conduction   Nerve and Stimulus Site  Onset Latency  Amplitude  Conduction Velocity  Distance  Normal    Median.L/APB    Wrist  3.7 ms  9.1 mV       70 mm  yes    Elbow  8 ms  8.6 mV  58 m/s  250 mm  yes    Ulnar.L/ADM    Wrist  2 ms  12.6 mV       70 mm  yes    Bel elbow  5.9 ms  12.4 mV  58 m/s  225 mm  yes    Ab elbow  7.6 ms  11.8  mV  59 m/s  100 mm  yes        Sensory Nerve Conduction   Nerve and Stimulus Site  Segment  Onset Latency  Amplitude  Conduction Velocity  Distance  Normal    Median (Dig II).L    Mid palm  Mid palm-Dig II  0.8 ms  49.5 uV  63 m/s  50 mm  yes    Wrist  Wrist-Dig II  2.3 ms  48.6 uV  57 m/s  130 mm  yes         Wrist-Mid palm            53 m/s  80 mm  yes    Ulnar (Dig V).L    Wrist  Wrist-Dig V  1.6 ms  48.4 uV  63 m/s  100 mm  yes        Needle EMG Data   Muscle S i d e Spontaneous Activity Motor Unit Morphology Interference Pattern     Insertional Activity Fibs/Pos. Waves Fascics Duration Amplitude Phases Activation Recruit ment   Deltoid  L  Normal  0  0  Normal  Normal  Normal  Normal  Normal    Biceps Brachii  L  Normal  0  0  Normal  Normal  Normal  Normal  Normal    Triceps  L  Normal  0  0  Normal  Normal  Normal  Normal  Normal    Flexor Carpi Radialis  L  Normal  0  0  Normal  Normal  Normal  Normal  Normal    First Dorsal Interosseous  L  Normal  0  0  Normal  Normal  Normal  Normal  Normal        Neuromuscular Ultrasound           Ultrasound Study of Peripheral Nerves            Nerve  Site  Side Cross-section Area  Normal    Median  distal wrist crease  Left  8.9 mm2  yes    Median  mid forearm  Left  6 mm2  yes       Ratio is 1.48    Median  antecubital fossa  Left  4.7 mm2  yes    Median  mid arm  Left  6.5 mm2  yes    Ulnar  wrist  Left  5.2 mm2  yes    Ulnar  mid forearm  Left  5.2  mm2  yes    Ulnar  cubital tunnel  Left  8.3 mm2  yes    Ulnar  ulnar groove  Left  8.2 mm2  yes    Ulnar  mid arm  Left  7.3 mm2  yes                  Interpretation & Conclusions   This is a normal study.       There is no electrodiagnostic evidence of a left cervical radiculopathy, a left median neuropathy at the wrist (i.e. carpal tunnel syndrome), or a left ulnar neuropathy.       Neuromuscular ultrasound of the left median nerve shows normal cross-sectional area at the wrist, forearm, antecubital fossa, and mid arm. Ultrasound of the left ulnar nerve shows normal cross-sectional area at the wrist, forearm, cubital tunnel, ulnar groove and mid arm.      As the teaching physician identified below, I was physically present for the  key portions of the electrodiagnostic examination and I prepared the interpretation at the time of the examination.        Signature   This report signed electronically by    Dr. Marshall Skeeter on 07/12/2023 10:53 AM       Dr. Marshall Skeeter / Colie Dawes R. NCS.T

## 2023-07-29 ENCOUNTER — Telehealth (INDEPENDENT_AMBULATORY_CARE_PROVIDER_SITE_OTHER): Payer: Self-pay

## 2023-07-29 ENCOUNTER — Telehealth: Payer: Self-pay

## 2023-07-29 ENCOUNTER — Encounter (INDEPENDENT_AMBULATORY_CARE_PROVIDER_SITE_OTHER): Payer: Self-pay

## 2023-07-29 NOTE — Telephone Encounter (Signed)
 Writer spoke with patient, she believes she gave two injections yesterday. She dialed the dose and injected but reported not hearing a click or feeling the injection. She then dialed it again and gave another injection. She also reported she is unsure that it was the right dose, possibly 1 mL. She called the pharmacy after because she believed she used 60% of the pen, which she stated they explained the pen is for more than one week use. Patient reported she stopped taking her metformin  3 days ago, she does not have a glucometer to check her BG, and she has been eating regularly. She continues to have a headache, pressure going down her neck, dizziness, and stomach ache.

## 2023-07-29 NOTE — Telephone Encounter (Signed)
 Patient called and left a message requesting a call back from nurse. Stated she isnt feeling well. Please reach out to patient

## 2023-07-29 NOTE — Telephone Encounter (Signed)
 Patient called back, tried transfering to [facility] but she did not answer, advised she was with a patient and I will have her call her back

## 2023-07-29 NOTE — Telephone Encounter (Signed)
 Writer called patient back and left a message.

## 2023-07-29 NOTE — Addendum Note (Signed)
 Addended by: Alante Weimann A on: 07/29/2023 04:38 PM     Modules accepted: Orders

## 2023-08-01 ENCOUNTER — Other Ambulatory Visit: Payer: Self-pay

## 2023-08-01 ENCOUNTER — Observation Stay
Admission: EM | Admit: 2023-08-01 | Discharge: 2023-08-02 | Disposition: A | Discharge status: Home / Self Care | Source: Ambulatory Visit | Attending: Internal Medicine | Admitting: Internal Medicine

## 2023-08-01 ENCOUNTER — Emergency Department

## 2023-08-01 ENCOUNTER — Encounter: Payer: Self-pay | Admitting: Gastroenterology

## 2023-08-01 DIAGNOSIS — I672 Cerebral atherosclerosis: Secondary | ICD-10-CM

## 2023-08-01 DIAGNOSIS — Z789 Other specified health status: Secondary | ICD-10-CM

## 2023-08-01 DIAGNOSIS — R42 Dizziness and giddiness: Secondary | ICD-10-CM | POA: Insufficient documentation

## 2023-08-01 DIAGNOSIS — M542 Cervicalgia: Secondary | ICD-10-CM | POA: Insufficient documentation

## 2023-08-01 DIAGNOSIS — R519 Headache, unspecified: Secondary | ICD-10-CM | POA: Insufficient documentation

## 2023-08-01 DIAGNOSIS — R200 Anesthesia of skin: Principal | ICD-10-CM | POA: Insufficient documentation

## 2023-08-01 DIAGNOSIS — E119 Type 2 diabetes mellitus without complications: Secondary | ICD-10-CM

## 2023-08-01 LAB — CBC AND DIFFERENTIAL
Baso # K/uL: 0 10*3/uL (ref 0.0–0.2)
Eos # K/uL: 0.1 10*3/uL (ref 0.0–0.5)
Hematocrit: 42 % (ref 34–49)
Hemoglobin: 13.7 g/dL (ref 11.2–16.0)
IMM Granulocytes #: 0 10*3/uL (ref 0.0–0.0)
IMM Granulocytes: 0.2 %
Lymph # K/uL: 3.5 10*3/uL (ref 1.0–5.0)
MCV: 94 fL (ref 75–100)
Mono # K/uL: 0.5 10*3/uL (ref 0.1–1.0)
Neut # K/uL: 4.1 10*3/uL (ref 1.5–6.5)
Nucl RBC # K/uL: 0 10*3/uL (ref 0.0–0.0)
Nucl RBC %: 0 /100{WBCs} (ref 0.0–0.2)
Platelets: 221 10*3/uL (ref 150–450)
RBC: 4.4 MIL/uL (ref 4.0–5.5)
RDW: 11.7 % (ref 0.0–15.0)
Seg Neut %: 49.7 %
WBC: 8.3 10*3/uL (ref 3.5–11.0)

## 2023-08-01 LAB — BASIC METABOLIC PANEL
Anion Gap: 10 (ref 7–16)
CO2: 27 mmol/L (ref 20–28)
Calcium: 9.3 mg/dL (ref 8.6–10.2)
Chloride: 105 mmol/L (ref 96–108)
Creatinine: 0.83 mg/dL (ref 0.51–0.95)
Glucose: 105 mg/dL — ABNORMAL HIGH (ref 60–99)
Lab: 11 mg/dL (ref 6–20)
Potassium: 4.4 mmol/L (ref 3.3–5.1)
Sodium: 142 mmol/L (ref 133–145)
eGFR BY CREAT: 86 *

## 2023-08-01 LAB — POCT GLUCOSE: Glucose POCT: 127 mg/dL — ABNORMAL HIGH (ref 60–99)

## 2023-08-01 LAB — PROTIME-INR
INR: 1 (ref 0.9–1.1)
Protime: 10.9 s (ref 10.0–12.9)

## 2023-08-01 LAB — APTT: aPTT: 32 s (ref 25.8–37.9)

## 2023-08-01 MED ORDER — DEXTROSE 5 % FLUSH FOR PUMPS *I*
0.0000 mL/h | INTRAVENOUS | Status: DC | PRN
Start: 2023-08-01 — End: 2023-08-02

## 2023-08-01 MED ORDER — SODIUM CHLORIDE 0.9 % FLUSH FOR PUMPS *I*
0.0000 mL/h | INTRAVENOUS | Status: DC | PRN
Start: 2023-08-01 — End: 2023-08-02

## 2023-08-01 NOTE — First Provider Contact (Signed)
 ED First Provider Contact Note    Initial provider evaluation performed by   ED First Provider Contact       Date/Time Event User Comments    08/01/23 2007 ED First Provider Contact Eileen Eileen Initial Face to Face Provider Contact            Vital signs reviewed.    Assessment: 48 year old presents due to patient and husband is concerned that possibly she is having a stroke.  Initially she described left arm weakness and unsteady gait that she said may have started at 430 but then she relates attacks been going for couple days.  Patient reports that she has been taking an increased amount of Ozempic .  She think it is causing some fatigue and weakness.  She also has factor V Leiden and administer only Lovenox  today due to the concern for stroke or blood clots.    I cannot specify a discrete onset of symptoms so I do not think she is in a window for any kind of thrombolytics even if she is having a stroke    BG = 128  Orders placed:  EKG, LABS, and CT     Patient requires further evaluation.     Eileen ROCKFORD, MD, 08/01/2023, 8:07 PM     Eileen Evalene, MD  08/01/23 2008

## 2023-08-01 NOTE — ED Triage Notes (Addendum)
 Patient to the ED with dizziness and unsteady gait headache pressure in her head.Patient ambulatory to triage. Patient states her left side feels heavy (has been worsening since Sunday). Took 40mg  Lovenox  today. Provider evaluating in triage. Patient also states she took too much ozempic  on Sunday. Symptoms started around 1630 today.     Prehospital medications given: No

## 2023-08-02 ENCOUNTER — Other Ambulatory Visit: Payer: Self-pay

## 2023-08-02 ENCOUNTER — Emergency Department

## 2023-08-02 DIAGNOSIS — R519 Headache, unspecified: Secondary | ICD-10-CM

## 2023-08-02 DIAGNOSIS — D6851 Activated protein C resistance: Secondary | ICD-10-CM

## 2023-08-02 DIAGNOSIS — M542 Cervicalgia: Secondary | ICD-10-CM

## 2023-08-02 DIAGNOSIS — R200 Anesthesia of skin: Principal | ICD-10-CM

## 2023-08-02 DIAGNOSIS — R42 Dizziness and giddiness: Secondary | ICD-10-CM

## 2023-08-02 LAB — BASIC METABOLIC PANEL
Anion Gap: 8 (ref 7–16)
CO2: 28 mmol/L (ref 20–28)
Calcium: 9.3 mg/dL (ref 8.6–10.2)
Chloride: 105 mmol/L (ref 96–108)
Creatinine: 0.77 mg/dL (ref 0.51–0.95)
Glucose: 112 mg/dL — ABNORMAL HIGH (ref 60–99)
Lab: 8 mg/dL (ref 6–20)
Potassium: 4.4 mmol/L (ref 3.3–5.1)
Sodium: 141 mmol/L (ref 133–145)
eGFR BY CREAT: 94 *

## 2023-08-02 LAB — CBC
Hematocrit: 44 % (ref 34–49)
Hemoglobin: 14.4 g/dL (ref 11.2–16.0)
MCV: 96 fL (ref 75–100)
Platelets: 209 10*3/uL (ref 150–450)
RBC: 4.6 MIL/uL (ref 4.0–5.5)
RDW: 11.6 % (ref 0.0–15.0)
WBC: 6.7 10*3/uL (ref 3.5–11.0)

## 2023-08-02 MED ORDER — CYCLOBENZAPRINE HCL 10 MG PO TABS *I*
10.0000 mg | ORAL_TABLET | Freq: Three times a day (TID) | ORAL | 0 refills | Status: AC | PRN
Start: 2023-08-02 — End: 2023-09-01
  Filled 2023-08-02: qty 15, 5d supply, fill #0

## 2023-08-02 MED ORDER — SODIUM CHLORIDE 0.9 % IV BOLUS *I*
1000.0000 mL | Freq: Once | Status: AC
Start: 2023-08-02 — End: 2023-08-02
  Administered 2023-08-02: 01:00:00 1000 mL via INTRAVENOUS

## 2023-08-02 MED ORDER — DICLOFENAC SODIUM 1 % TD GEL (DOSED) *I*
4.0000 g | Freq: Four times a day (QID) | TRANSDERMAL | Status: DC
Start: 2023-08-02 — End: 2023-08-02
  Administered 2023-08-02: 11:00:00 4 g via TOPICAL
  Filled 2023-08-02: qty 100

## 2023-08-02 MED ORDER — CYCLOBENZAPRINE HCL 5 MG PO TABS *I*
10.0000 mg | ORAL_TABLET | Freq: Once | ORAL | Status: AC
Start: 2023-08-02 — End: 2023-08-02
  Administered 2023-08-02: 11:00:00 10 mg via ORAL
  Filled 2023-08-02: qty 2

## 2023-08-02 MED ORDER — DIPHENHYDRAMINE HCL 50 MG/ML IJ SOLN *I*
25.0000 mg | Freq: Once | INTRAMUSCULAR | Status: AC
Start: 2023-08-02 — End: 2023-08-02
  Administered 2023-08-02: 01:00:00 25 mg via INTRAVENOUS
  Filled 2023-08-02: qty 1

## 2023-08-02 MED ORDER — METOCLOPRAMIDE HCL 5 MG/ML IJ SOLN *I*
10.0000 mg | Freq: Once | INTRAMUSCULAR | Status: AC
Start: 2023-08-02 — End: 2023-08-02
  Administered 2023-08-02: 01:00:00 10 mg via INTRAVENOUS
  Filled 2023-08-02: qty 2

## 2023-08-02 MED ORDER — ASPIRIN 81 MG PO TBEC *I*
81.0000 mg | DELAYED_RELEASE_TABLET | Freq: Every day | ORAL | Status: DC
Start: 2023-08-02 — End: 2023-08-02
  Administered 2023-08-02: 11:00:00 81 mg via ORAL
  Filled 2023-08-02: qty 1

## 2023-08-02 MED ORDER — ROSUVASTATIN CALCIUM 5 MG PO TABS *I*
10.0000 mg | ORAL_TABLET | Freq: Every day | ORAL | Status: DC
Start: 2023-08-02 — End: 2023-08-02
  Administered 2023-08-02: 11:00:00 10 mg via ORAL
  Filled 2023-08-02: qty 2

## 2023-08-02 NOTE — Discharge Instructions (Addendum)
 Please use Tylenol (3000 mg max/day) and ibuprofen (2400 mg max per day) regularly for pain  Please use voltaren  gel 4 times a day into any muscles that are in spasm and causing pain  Please take flexeril  up to 3 times a day when your muscles are in pain and in spasm  Massage may help prevent this from happening again  Heat applied to the area may also help your muscles stay out of spasm      Please follow up with your PCP to make sure your pain stays under control      Please return to the ED if you have return of severe symptoms that you cannot manage at home on your own

## 2023-08-02 NOTE — ED Notes (Signed)
 08/02/23 0042   Observation Care   Observation care initiated  Yes   Patient has been verbally notified of their observation status Yes   Report Recieved From ED Nurse Yes   Unit Based Transportation Requested Yes

## 2023-08-02 NOTE — Progress Notes (Signed)
 08/02/23 1100   UM Patient Class Review   Patient Class Review Observation     Patient Class effective 08/02/23    Ritta Paganini, BSN, RN  [facility] - Utilization Management   [address] [place], [place]  [address]  Office: [phone]  Pager: [phone]

## 2023-08-02 NOTE — ED Obs Notes (Signed)
 ADMISSION NOTE [facility] - LOCATION: EOU    Patient seen by me today, 08/02/2023 at 9:55 AM    History     The patient is a 48 y.o. female who has been placed on the EOU service after evaluation in the [facility] Emergency Department for Lightheadedness (Dizziness) and Headache.    Is a patient who does not normally have hA    She has been having heavy head and HA since Sunday  She started ozempic  at the higher dose (this was a recent change)  She took a double dose by accident  She thought she became hypoglycemic, she has nausea and HA, and   She had body pain, weakness, and left sided numbness    SHe called PCP and they directed her to ED if symptoms worsened  She was trying to take a lot of liquid IV and things were stable    On Thursday she went out and she was observed to be confused and repeating her speech a few times  To her husband she seemed unsteady on her feet  Felt lightheaded with pressure behind her eyes and felt a heavy numbness and weakness on the left side  She took a dose of lovenox  at home due to concern for clot        Past Medical History:   Diagnosis Date    Atopic dermatitis 03/19/2023    Chickenpox     in childhood, very mild case    Factor V Leiden mutation     Gestational diabetes mellitus     Hyperlipemia     Obesity     Post-thrombotic syndrome of left lower extremity     Vitamin D  deficiency        History reviewed. No pertinent surgical history.    Family History   Problem Relation Age of Onset    High Blood Pressure Father     Diabetes Father     DVT (Deep Vein Thrombosis) Father     GERD Father     Liver Disease Father     No Known Problems Daughter     No Known Problems Son        Social History      reports that she has never smoked. She has been exposed to tobacco smoke. She has never used smokeless tobacco. She reports that she does not drink alcohol and does not use drugs. No history on file for sexual activity.     Living Situation       Questions Responses    Patient  lives with     Homeless     Caregiver for other family member     External Services     Employment     Domestic Violence Risk               Physical Exam   BP 131/79 (BP Location: Left arm)   Pulse 65   Temp 37.1 C (98.8 F) (Temporal)   Resp 18   Ht 1.664 m (5' 5.5)   Wt 89.8 kg (198 lb)   SpO2 98%   BMI 32.45 kg/m     Physical Exam  Constitutional:       General: She is not in acute distress.     Appearance: She is not ill-appearing.   HENT:      Head: Atraumatic.      Right Ear: External ear normal.      Left Ear: External ear normal.  Nose: No rhinorrhea.      Mouth/Throat:      Mouth: Mucous membranes are moist.   Eyes:      General: No scleral icterus.        Right eye: No discharge.         Left eye: No discharge.      Conjunctiva/sclera: Conjunctivae normal.   Cardiovascular:      Rate and Rhythm: Normal rate and regular rhythm.      Heart sounds: Normal heart sounds. No murmur heard.  Pulmonary:      Effort: Pulmonary effort is normal. No respiratory distress.      Breath sounds: Normal breath sounds.   Abdominal:      General: Bowel sounds are normal. There is no distension.      Tenderness: There is no abdominal tenderness.   Musculoskeletal:      Right lower leg: No edema.      Left lower leg: No edema.      Comments: Significant muscle spasm and tightness apparent in left shoulder and neck areas   Slightly decreased ROM on left rotation of head   Skin:     General: Skin is warm and dry.      Coloration: Skin is not pale.   Neurological:      General: No focal deficit present.      Mental Status: She is alert and oriented to person, place, and time.      Coordination: Coordination normal.      Gait: Gait normal.   Psychiatric:         Mood and Affect: Mood normal.         Behavior: Behavior normal.         Thought Content: Thought content normal.         Tests     EKG interpretation: N/A    Telemetry interpretation: N/A     Labs:  All labs in the last 24 hours:   Recent Results (from the  past 24 hours)   POCT glucose    Collection Time: 08/01/23  8:05 PM   Result Value Ref Range    Glucose POCT 127 (H) 60 - 99 mg/dL   Basic metabolic panel    Collection Time: 08/01/23  8:59 PM   Result Value Ref Range    Glucose 105 (H) 60 - 99 mg/dL    Sodium 857 866 - 854 mmol/L    Potassium 4.4 3.3 - 5.1 mmol/L    Chloride 105 96 - 108 mmol/L    CO2 27 20 - 28 mmol/L    Anion Gap 10 7 - 16    UN 11 6 - 20 mg/dL    Creatinine 9.16 9.48 - 0.95 mg/dL    eGFR BY CREAT 86 *    Calcium  9.3 8.6 - 10.2 mg/dL   CBC and differential    Collection Time: 08/01/23  8:59 PM   Result Value Ref Range    WBC 8.3 3.5 - 11.0 THOU/uL    RBC 4.4 4.0 - 5.5 MIL/uL    Hemoglobin 13.7 11.2 - 16.0 g/dL    Hematocrit 42 34 - 49 %    MCV 94 75 - 100 fL    RDW 11.7 0.0 - 15.0 %    Platelets 221 150 - 450 THOU/uL    Seg Neut % 49.7 %    Neut # K/uL 4.1 1.5 - 6.5 THOU/uL    Lymph # K/uL 3.5 1.0 -  5.0 THOU/uL    Mono # K/uL 0.5 0.1 - 1.0 THOU/uL    Eos # K/uL 0.1 0.0 - 0.5 THOU/uL    Baso # K/uL 0.0 0.0 - 0.2 THOU/uL    Nucl RBC % 0.0 0.0 - 0.2 /100 WBC    Nucl RBC # K/uL 0.0 0.0 - 0.0 THOU/uL    IMM Granulocytes # 0.0 0.0 - 0.0 THOU/uL    IMM Granulocytes 0.2 %   Protime-INR    Collection Time: 08/01/23  8:59 PM   Result Value Ref Range    Protime 10.9 10.0 - 12.9 sec    INR 1.0 0.9 - 1.1   APTT    Collection Time: 08/01/23  8:59 PM   Result Value Ref Range    aPTT 32.0 25.8 - 37.9 sec       Imaging results:   CT head without contrast for stroke  Result Date: 08/02/2023  08/01/2023 8:24 PM CT BRAIN WITHOUT CONTRAST CLINICAL INFORMATION: Dizziness headache concern for intracranial hemorrhage or stroke. COMPARISON: None. TECHNIQUE:  CT of the brain was performed without intravenous contrast. Multiplanar reformations were acquired. Automated exposure control, adjustment of the mA and/or kV according to patient size, and/or iterative reconstruction techniques were utilized  for radiation dose optimization. FINDINGS: BRAIN PARENCHYMA:  No abnormal  attenuation is visualized within the brain parenchyma. No mass effect or abnormal attenuation to suggest an acute or subacute transcortical infarction is appreciated. No intracranial hemorrhage is evident. VENTRICLES AND EXTRA-AXIAL SPACES:  The ventricles, sulci and fissures are normal in size and configuration for the patient's age.  No extra-axial fluid collection is present. BONES:  Intact. EXTRACRANIAL STRUCTURES:  The visualized paranasal sinuses are clear. The visualized mastoid air cells are clear. OTHER: Empty sella configuration. Vascular calcifications of the intracranial right internal carotid artery.     No CT evidence of acute intracranial abnormality. Empty sella configuration. Early intracranial vascular calcifications in excess of what is typically expected for the patient's age. Recommend correlation for risk factors for atherosclerotic disease. END OF IMPRESSION I have personally reviewed the images and the Resident's/Fellow's interpretation and agree with or edited the findings.        Independent interpretation of imaging:  MRV pending    Medical Decision Making     Assessment:    48 y.o. female has been placed on the EOU service for Lightheadedness (Dizziness) and Headache        Problems addressed and plan:   Neck pain and headache:  Will treat HA with flexeril  and Voltaren  and evaluate for effectiveness  Follow up MRV result  Monitor pain, daily labs      Discussions with Other Providers: N/A  Consultant teams previously spoken with by prior Providers: N/A    Parenteral controlled substances: none  Review of external/existing labs/records: imaging and labs from prior providers reviewed unrevealing for cause of ppain and MRV pending  DVT prophylaxis:  daily ASA  Smoking Cessation: NA  Code Status: full  COVID-19 status: na    SDoH affecting care (Long term issues affecting a patient's overall health):   None identified    Disposition Barriers (More acute issues that are limiting a patient's  discharge today):   None identified                 Author: Powell JINNY Leeds, MD     Jimel Myler, Powell Mays, MD  08/02/23 1029

## 2023-08-02 NOTE — ED Notes (Signed)
 Report Given To  Rollo, RN      Descriptive Sentence / Reason for Admission     Chief Complaint/Presenting Symptoms: lightheadedness, headache    Admitting Diagnosis: numbness, non intractable headache      Active Issues / Relevant Events     Orientation Status: A&Ox4    Ambulates with: independent    Skin/wounds/specialty bed needs: n    Precautions: standard    O2 needs: RA    PO status/How pt takes pills: regular diet, whole with water,     Any meds or valuables with patients:     Psychosocial/Behavior/1:1 needs: n    Mode of Transport to IP (pt is in stretcher/wheelchair/IP bed): stretcher      To Do List  [id number]  Meds per [facility]  MRI screening      Anticipatory Guidance / Discharge Planning    Patient's living situation/needs for discharge: from home with husband. Dispo home when medically ready

## 2023-08-02 NOTE — ED Notes (Signed)
 Patient ready to be discharged. Discharge instruction given to patient and no further questions regarding discharge instruction.

## 2023-08-02 NOTE — ED Provider Notes (Signed)
 History     Chief Complaint   Patient presents with    Lightheadedness (Dizziness)    Headache     HPI    Patient is a 48 year old female with a history of factor V Leiden, type 2 diabetes who presents with heaviness in the left side of her face, arm and leg.  Last Sunday, the patient accidentally administered extra doses of her Ozempic .  She reports that afterwards, she developed a headache and had been feeling unwell over the course of the week.  Today around 6 PM, she went grocery shopping and when she got out of the car, she developed a heaviness and numbness on the left side of her body involving the face left arm and left leg.  Per her husband, she appeared to have some word finding difficulty as well.  Denies any focal weakness, slurred speech or significant vision changes.  Since then, the symptoms have improved, but have not completely resolved.  She does have a history of intermittent numbness in the left arm and recently had an EMG study, but never has had symptoms involving the face.  Reports associated headache.      Medical/Surgical/Family History     Past Medical History:   Diagnosis Date    Atopic dermatitis 03/19/2023    Chickenpox     in childhood, very mild case    Factor V Leiden mutation     Gestational diabetes mellitus     Hyperlipemia     Obesity     Post-thrombotic syndrome of left lower extremity     Vitamin D  deficiency         Patient Active Problem List   Diagnosis Code    Allergic rhinitis J30.9    Arthralgia of right knee M25.561    Factor V Leiden D68.51    Gestational diabetes mellitus O24.419    Hyperlipidemia E78.5    Malaise and fatigue R53.81, R53.83    Obesity E66.9    Post-thrombotic syndrome of left lower extremity I87.002    Type 2 diabetes mellitus E11.9    Vitamin D  deficiency E55.9    Occipital headache R51.9    Left arm numbness R20.0    BMI 33.0-33.9,adult Z68.33    Arthritis of left knee M17.12            No past surgical history on file.       Social History[1]           Review of Systems    Physical Exam     Triage Vitals  Triage Start: Start, (08/01/23 2002)  First Recorded BP: 138/83, Resp: 18, Temp: 35.9 C (96.6 F), Temp src: TEMPORAL Oxygen Therapy SpO2: 100 %, O2 Device: None (Room air), Heart Rate: 74, (08/01/23 2004)  .      Physical Exam    Vital Signs Reviewed.  GENERAL: Patient appears stated age.  Alert.  HEENT:  Atraumatic.  Conjunctiva clear.  Mucous membranes moist.  CARDIAC:  Regular rate and rhythm.  No murmurs, gallops or rubs.  2+ radial pulses bilaterally.  PULMONARY:  Lungs clear to auscultation bilaterally with normal respiratory effort.  ABDOMEN:  Soft, non-tender, non-distended.   EXTREMITIES:  No deformities or tenderness.  No clubbing or cyanosis.    LYMPH:  No lower extremity edema.   SKIN: warm and dry without rash  NEURO: PERRL. Visual fields full.  EOMI.  ace symmetric.  Speech clear. Tongue midline.  Normal shoulder shrug.  No pronator drift.  5/5 strength  UE & LE. No dysmetria on finger-nose-finger.   Decreased sensation of left face, arm and leg.   PSYCH:  Normal mood and affect.      Medical Decision Making   Patient seen by me on:  08/01/2023    Assessment:  Patient is a 48 year old female with a history of factor V Leiden, type 2 diabetes who presents with heaviness in the left side of her face, arm and leg.  She has a history of factor V Leiden.    Differential diagnosis:    Differential etiologies such as complex migraine, she could have peripheral related paresthesias that she has had intermittent numbness, however have to consider central cause such as CVA.  Given her history of factor V Leiden, she is at increased risk for sinus venous thrombosis.    Plan:  Reviewed head CT that was obtained through triage as well as labs.  No significant abnormalities.  Plan to admit to EOU for MRV.  Will order migraine cocktail.  Plan discussed with patient and her husband.               Burleigh Brewer, MD,PhD              [1]   Social History  Tobacco  Use    Smoking status: Never     Passive exposure: Past    Smokeless tobacco: Never   Substance Use Topics    Alcohol use: Never    Drug use: Never        Brewer Burleigh, MD,PhD  08/02/23 0118

## 2023-08-05 NOTE — Telephone Encounter (Signed)
Pt question

## 2023-08-06 MED ORDER — FREESTYLE LITE TEST VI STRP *A*
ORAL_STRIP | 1 refills | Status: AC
Start: 2023-08-06 — End: ?

## 2023-08-06 MED ORDER — BLOOD GLUCOSE MONITOR SYSTEM KIT *A*
PACK | 0 refills | Status: AC
Start: 2023-08-06 — End: ?

## 2023-08-06 MED ORDER — FREESTYLE LANCETS MISC *A*
1 refills | Status: DC
Start: 2023-08-06 — End: 2023-09-09

## 2023-08-06 NOTE — Addendum Note (Signed)
 Addended by: REESA PAGAN on: 08/06/2023 09:43 AM     Modules accepted: Orders

## 2023-08-07 ENCOUNTER — Other Ambulatory Visit: Payer: Self-pay | Admitting: Primary Care

## 2023-08-08 LAB — EKG 12-LEAD
P: 59 deg
PR: 171 ms
QRS: 59 deg
QRSD: 78 ms
QT: 413 ms
QTc: 451 ms
Rate: 72 {beats}/min
T: 84 deg

## 2023-09-09 ENCOUNTER — Other Ambulatory Visit: Payer: Self-pay

## 2023-09-09 ENCOUNTER — Encounter: Payer: Self-pay | Admitting: Primary Care

## 2023-09-09 ENCOUNTER — Ambulatory Visit: Attending: Primary Care | Admitting: Primary Care

## 2023-09-09 VITALS — BP 114/86 | HR 77 | Temp 98.1°F | Resp 18 | Ht 65.16 in | Wt 197.2 lb

## 2023-09-09 DIAGNOSIS — E119 Type 2 diabetes mellitus without complications: Secondary | ICD-10-CM | POA: Insufficient documentation

## 2023-09-09 DIAGNOSIS — R5383 Other fatigue: Secondary | ICD-10-CM | POA: Insufficient documentation

## 2023-09-09 DIAGNOSIS — E782 Mixed hyperlipidemia: Secondary | ICD-10-CM | POA: Insufficient documentation

## 2023-09-09 DIAGNOSIS — R5381 Other malaise: Secondary | ICD-10-CM | POA: Insufficient documentation

## 2023-09-09 DIAGNOSIS — E669 Obesity, unspecified: Secondary | ICD-10-CM | POA: Insufficient documentation

## 2023-09-09 MED ORDER — TIRZEPATIDE 2.5 MG/0.5ML SC SOAJ *I*
2.5000 mg | SUBCUTANEOUS | 1 refills | Status: DC
Start: 2023-09-09 — End: 2023-10-31

## 2023-09-09 NOTE — Assessment & Plan Note (Signed)
 Will update TSH, Vit B12 , iron and Vit d panel

## 2023-09-09 NOTE — Assessment & Plan Note (Addendum)
 Treatment failure with Ozempic , Also on Metformin   Will start Mounjaro  Pt agreeable with plan   If not covered, pt was advised to call the office so we can increase Ozempic  to 2 mg

## 2023-09-09 NOTE — Assessment & Plan Note (Signed)
 No evidence of statin intolerance  ontinue current treatment

## 2023-09-09 NOTE — Assessment & Plan Note (Signed)
 Weight loss of 5 to 7 % of body weight is our initial weight loss goal.  Weight loss of more than 5% can reduce risk factors for cardiovascular disease, such as dyslipidemia, hypertension, and diabetes mellitus.  -Get at least 150 minutes per week of moderate-intensity aerobic activity or 75 minutes per week of vigorous aerobic activity, or a combination of both, preferably spread throughout the week.  Add moderate - to high-intensity muscle-strengthening activity (such as resistance or weights) on at least 2 days per week.  -Follow low carb high protein diet.

## 2023-09-09 NOTE — Progress Notes (Signed)
 [facility]   Eileen Arch, MD               Chief Complaint   Patient presents with    Follow-up     2 months, weight management not seeing much results.            Eileen Bond is a 48 y.o. female     HPI     History of Present Illness  The patient presents for a follow-up on her medications and weight loss.    She reports a stable weight, fluctuating between 195 and 200 pounds, despite maintaining an early dinner schedule, a low-carbohydrate diet, and adequate hydration. She has observed that her clothing appears to be looser. She is currently on Ozempic  but has experienced an accidental overdose, which necessitated an emergency room visit. She also reports a persistent need for increased vitamin intake and experiences constant fatigue and sleepiness, which is unusual for her. She has been experiencing mild blurred vision, a symptom she attributes to her use of Ozempic . She has been under the regular care of an ophthalmologist.    History     Chief Complaint   Patient presents with    Follow-up     2 months, weight management not seeing much results.       Medical/Surgical/Family History     Past Medical History:   Diagnosis Date    Atopic dermatitis 03/19/2023    Chickenpox     in childhood, very mild case    Factor V Leiden mutation     Gestational diabetes mellitus     Hyperlipemia     Obesity     Post-thrombotic syndrome of left lower extremity     Vitamin D  deficiency         Patient Active Problem List   Diagnosis Code    Allergic rhinitis J30.9    Arthralgia of right knee M25.561    Factor V Leiden D68.51    Gestational diabetes mellitus O24.419    Hyperlipidemia E78.5    Malaise and fatigue R53.81, R53.83    Obesity E66.9    Post-thrombotic syndrome of left lower extremity I87.002    Type 2 diabetes mellitus E11.9    Vitamin D  deficiency E55.9    Occipital headache R51.9    Left arm numbness R20.0    Arthritis of left knee M17.12            No past surgical  history on file.  Family History   Problem Relation Age of Onset    High Blood Pressure Father     Diabetes Father     DVT (Deep Vein Thrombosis) Father     GERD Father     Liver Disease Father     No Known Problems Daughter     No Known Problems Son           Social History[1]  Living Situation       Questions Responses    Patient lives with     Homeless     Caregiver for other family member     External Services     Employment     Domestic Violence Risk                   Review of Systems   ROS     Physical Exam     Vitals:    09/09/23 1013   BP: 114/86   Pulse: 77  Resp: 18   Temp: 36.7 C (98.1 F)   Weight: 89.4 kg (197 lb 3.2 oz)   Height: 1.655 m (5' 5.16")          Physical Exam    Clinical Assessment     Assessment & Plan  1. Weight management.  - BMI has decreased from 33 to 32.  - Patient reports weight fluctuating between 195 and 200 lbs, with clothes fitting looser.  - Prescription for Mounjaro issued; patient advised to continue Ozempic  until insurance approval for Eileen Bond is confirmed. If denied, Ozempic  dosage will be increased to 2 mg.  - Comprehensive blood workup ordered to assess vitamin B12, vitamin D , thyroid  function, and iron levels. Vitamin B12 injection will be administered if levels are low.    Follow-up in 1 month.    Plan   Type 2 diabetes mellitus  Treatment failure with Ozempic , Also on Metformin   Will start Mounjaro  Pt agreeable with plan   If not covered, pt was advised to call the office so we can increase Ozempic  to 2 mg      Obesity   Weight loss of 5 to 7 % of body weight is our initial weight loss goal.  Weight loss of more than 5% can reduce risk factors for cardiovascular disease, such as dyslipidemia, hypertension, and diabetes mellitus.  -Get at least 150 minutes per week of moderate-intensity aerobic activity or 75 minutes per week of vigorous aerobic activity, or a combination of both, preferably spread throughout the week.  Add moderate - to high-intensity  muscle-strengthening activity (such as resistance or weights) on at least 2 days per week.  -Follow low carb high protein diet.     Malaise and fatigue   Will update TSH, Vit B12 , iron and Vit d panel     Hyperlipidemia  No evidence of statin intolerance  ontinue current treatment          Follow Up     Follow up in about 1 month (around 10/09/2023).          (MyChart Activation Status- Activated [1])       Eileen Arch, MD   [facility]  [address], [address]  [facility]  [place] [address]  Dept: [phone]  Dept Fax: [phone]       This note was done with the assistance of [employer] AI recognition system.  A reasonable attempt to proofread for any mistakes was done. If any are noted, they in no way reflect the quality of the care rendered.          [1]   Social History  Tobacco Use    Smoking status: Never     Passive exposure: Never    Smokeless tobacco: Never   Substance Use Topics    Alcohol use: Never    Drug use: Never

## 2023-09-18 ENCOUNTER — Encounter: Payer: Self-pay | Admitting: Primary Care

## 2023-10-10 ENCOUNTER — Ambulatory Visit: Admitting: Primary Care

## 2023-10-17 ENCOUNTER — Telehealth: Payer: Self-pay | Admitting: Primary Care

## 2023-10-17 NOTE — Telephone Encounter (Signed)
 Writer suggested [employer] in [place]

## 2023-10-17 NOTE — Telephone Encounter (Signed)
 General        Symptoms:  Patient called to request a recommendation for a podiatrist. I offered her an appointment to come in and discuss and get a referral,  but she said her insurance does not require a referral and she is just looking for a recommendation. She is going to be traveling to several countries and needs to have good foot support and so is looking for a recommendation from us  for a podiatrist that can help her with that.

## 2023-10-23 ENCOUNTER — Ambulatory Visit: Admitting: Primary Care

## 2023-10-30 ENCOUNTER — Telehealth: Payer: Self-pay

## 2023-10-30 NOTE — Telephone Encounter (Signed)
 FYI   When calling patient to confirm appointment patient stated she was not sure if she should come because she did not get blood work done Patient states that she had side effects from the Mounjaro , that she was weak wasn't able to get up. Patient states that she took the second shot not so bad but patient did stop taking rosuvastatin  20 mg tablet and metFORMIN  (GLUCOPHAGE ) 1,000 mg tablet  Writer recommended that patient should keep appointment to discuss side effects and discontinue of above medication patient was good with that states she will come in.

## 2023-10-31 ENCOUNTER — Other Ambulatory Visit: Payer: Self-pay

## 2023-10-31 ENCOUNTER — Ambulatory Visit: Attending: Primary Care | Admitting: Primary Care

## 2023-10-31 VITALS — BP 122/78 | HR 71 | Temp 97.2°F | Resp 16 | Ht 65.16 in | Wt 198.0 lb

## 2023-10-31 DIAGNOSIS — E782 Mixed hyperlipidemia: Secondary | ICD-10-CM | POA: Insufficient documentation

## 2023-10-31 DIAGNOSIS — E119 Type 2 diabetes mellitus without complications: Secondary | ICD-10-CM | POA: Insufficient documentation

## 2023-10-31 DIAGNOSIS — E559 Vitamin D deficiency, unspecified: Secondary | ICD-10-CM | POA: Insufficient documentation

## 2023-10-31 DIAGNOSIS — E669 Obesity, unspecified: Secondary | ICD-10-CM | POA: Insufficient documentation

## 2023-10-31 DIAGNOSIS — Z6832 Body mass index (BMI) 32.0-32.9, adult: Secondary | ICD-10-CM | POA: Insufficient documentation

## 2023-10-31 MED ORDER — TIRZEPATIDE 2.5 MG/0.5ML SC SOAJ *I*
2.5000 mg | SUBCUTANEOUS | 1 refills | Status: DC
Start: 2023-10-31 — End: 2023-11-28

## 2023-10-31 NOTE — Assessment & Plan Note (Signed)
 Weight loss of 5 to 7 % of body weight is our initial weight loss goal.  Weight loss of more than 5% can reduce risk factors for cardiovascular disease, such as dyslipidemia, hypertension, and diabetes mellitus.  -Get at least 150 minutes per week of moderate-intensity aerobic activity or 75 minutes per week of vigorous aerobic activity, or a combination of both, preferably spread throughout the week.  Add moderate - to high-intensity muscle-strengthening activity (such as resistance or weights) on at least 2 days per week.  -Follow low carb high protein diet.

## 2023-10-31 NOTE — Assessment & Plan Note (Signed)
 No evidence of statin intolerance  Continue current treatment

## 2023-10-31 NOTE — Assessment & Plan Note (Addendum)
 Lab Results   Component Value Date    HA1C 5.9 (H) 03/12/2023      A1c% adequate for age and demographics  No episodes of hypoglycemia reported   Experienced severe vomiting and soft stools with the first dose, which have since improved.  - Second dose administered on 10/28/2023 without recurrence of symptoms.  - Advised to monitor for any returning symptoms; discontinuation will be considered if symptoms reappear.  - Advised to maintain hydration, avoid skipping meals, consume small frequent meals, adhere to a high-protein diet, carry liquid IV for electrolyte replacement, and Imodium for potential diarrhea during travel.

## 2023-10-31 NOTE — Progress Notes (Signed)
 [facility] Internal Medicine   [name], MD               Chief Complaint   Patient presents with    Follow-up     1 month reaction to mounjaro             Eileen Bond is a 48 y.o. female     HPI     History of Present Illness  The patient presents for evaluation of an adverse reaction to Mounjaro .    She experienced a severe reaction to Mounjaro , including vomiting and significant weight loss over a period of 3 to 4 days. Although she has since recovered, her stool remains soft, which is an improvement from the initial 4 days. She is currently on her second dose of Mounjaro , taken on Monday, and has not experienced any adverse reactions since then.    She is planning a trip to [place] and will be away for 26 days. She is seeking advice on whether she needs to take any precautions related to her medication or the change in climate. She consumes a lot of protein bars and always carries Imodium with her when traveling. She also uses Pedialyte packets during her travels.    Previously, she lost about 20 pounds while on Ozempic , but her weight loss plateaued after that. She is mindful of her diet and is considering whether a more aggressive approach might be beneficial. She had temporarily stopped taking metformin  due to the reaction but has since resumed it. She also stopped taking her cholesterol medication due to the reaction.    Diet: She consumes a lot of protein bars.    History     Chief Complaint   Patient presents with    Follow-up     1 month reaction to mounjaro        Medical/Surgical/Family History     Past Medical History[1]     Patient Active Problem List   Diagnosis Code    Allergic rhinitis J30.9    Arthralgia of right knee M25.561    Factor V Leiden D68.51    Gestational diabetes mellitus O24.419    Hyperlipidemia E78.5    Malaise and fatigue R53.81, R53.83    Obesity E66.9    Post-thrombotic syndrome of left lower extremity I87.002    Type 2 diabetes mellitus E11.9     Vitamin D  deficiency E55.9    Occipital headache R51.9    Left arm numbness R20.0    BMI 32.0-32.9,adult Z68.32    Arthritis of left knee M17.12            Past Surgical History[2]  Family History[3]       Social History[4]  Living Situation       Questions Responses    Patient lives with     Homeless     Caregiver for other family member     External Services     Employment     Domestic Violence Risk                   Review of Systems   ROS     Physical Exam     Vitals:    10/31/23 0923   BP: 122/78   Pulse: 71   Resp: 16   Temp: 36.2 C (97.2 F)   Weight: 89.8 kg (198 lb)   Height: 1.655 m (5' 5.16)          Physical Exam  Clinical Assessment     Assessment & Plan  1. Adverse reaction to Mounjaro .  - Experienced severe vomiting and soft stools with the first dose, which have since improved.  - Second dose administered on 10/28/2023 without recurrence of symptoms.  - Advised to monitor for any returning symptoms; discontinuation will be considered if symptoms reappear.  - Advised to maintain hydration, avoid skipping meals, consume small frequent meals, adhere to a high-protein diet, carry liquid IV for electrolyte replacement, and Imodium for potential diarrhea during travel.    2. Medication management.  - Temporarily stopped metformin  and cholesterol medication due to adverse reaction but has since restarted them.  - Advised to continue these medications as prescribed.  - Blood work ordered; advised to complete labs after returning from 26-day trip.    Follow-up: The patient will follow up in 1 month.    Plan   Type 2 diabetes mellitus     Lab Results   Component Value Date    HA1C 5.9 (H) 03/12/2023      A1c% adequate for age and demographics  No episodes of hypoglycemia reported   Experienced severe vomiting and soft stools with the first dose, which have since improved.  - Second dose administered on 10/28/2023 without recurrence of symptoms.  - Advised to monitor for any returning symptoms;  discontinuation will be considered if symptoms reappear.  - Advised to maintain hydration, avoid skipping meals, consume small frequent meals, adhere to a high-protein diet, carry liquid IV for electrolyte replacement, and Imodium for potential diarrhea during travel.      BMI 32.0-32.9,adult  Weight loss of 5 to 7 % of body weight is our initial weight loss goal.  Weight loss of more than 5% can reduce risk factors for cardiovascular disease, such as dyslipidemia, hypertension, and diabetes mellitus.  -Get at least 150 minutes per week of moderate-intensity aerobic activity or 75 minutes per week of vigorous aerobic activity, or a combination of both, preferably spread throughout the week.  Add moderate - to high-intensity muscle-strengthening activity (such as resistance or weights) on at least 2 days per week.  -Follow low carb high protein diet.      Obesity  Weight loss of 5 to 7 % of body weight is our initial weight loss goal.  Weight loss of more than 5% can reduce risk factors for cardiovascular disease, such as dyslipidemia, hypertension, and diabetes mellitus.  -Get at least 150 minutes per week of moderate-intensity aerobic activity or 75 minutes per week of vigorous aerobic activity, or a combination of both, preferably spread throughout the week.  Add moderate - to high-intensity muscle-strengthening activity (such as resistance or weights) on at least 2 days per week.  -Follow low carb high protein diet.      Hyperlipidemia  No evidence of statin intolerance  Continue current treatment     Vitamin D  deficiency  Pending labs           Follow Up     Follow up in about 1 month (around 12/01/2023).          (MyChart Activation Status- Activated [1])       [name], MD   [facility] INTERNAL MEDICINE  [address]  [facility]  [place] [address]  Dept: [phone]  Dept Fax: [phone]       This note was done with the assistance of [employer]  Copilot AI recognition system.  A reasonable attempt to  proofread for any mistakes was done. If any are noted, they in no way reflect the quality of the care rendered.          [1]   Past Medical History:  Diagnosis Date    Atopic dermatitis 03/19/2023    Chickenpox     in childhood, very mild case    Factor V Leiden mutation     Gestational diabetes mellitus     Hyperlipemia     Obesity     Post-thrombotic syndrome of left lower extremity     Vitamin D  deficiency    [2] No past surgical history on file.  [3]   Family History  Problem Relation Name Age of Onset    High Blood Pressure Father      Diabetes Father      DVT (Deep Vein Thrombosis) Father      GERD Father      Liver Disease Father      No Known Problems Daughter      No Known Problems Son     [4]   Social History  Tobacco Use    Smoking status: Never     Passive exposure: Never    Smokeless tobacco: Never   Substance Use Topics    Alcohol use: Never    Drug use: Never

## 2023-10-31 NOTE — Assessment & Plan Note (Signed)
 Pending labs

## 2023-11-08 ENCOUNTER — Telehealth: Payer: Self-pay | Admitting: Primary Care

## 2023-11-08 NOTE — Telephone Encounter (Signed)
 Patient is calling with questions about tirzepatide  (MOUNJARO ) 2.5 mg/0.18mL pen, if she can take a blood thinner with this.

## 2023-11-08 NOTE — Telephone Encounter (Signed)
 Patient called office to ask if she can take her blood thinner with Mounjaro  (patient was calling while on her way to the airport). Writer advised patient to call her pharmacist to verify interactions between enoxaparin  and mounjaro , patient made aware there are no providers in office today.

## 2023-11-27 ENCOUNTER — Other Ambulatory Visit
Admission: RE | Admit: 2023-11-27 | Discharge: 2023-11-27 | Disposition: A | Discharge status: Home / Self Care | Source: Ambulatory Visit | Attending: Primary Care | Admitting: Primary Care

## 2023-11-27 DIAGNOSIS — R5383 Other fatigue: Secondary | ICD-10-CM | POA: Insufficient documentation

## 2023-11-27 DIAGNOSIS — E119 Type 2 diabetes mellitus without complications: Secondary | ICD-10-CM | POA: Insufficient documentation

## 2023-11-27 DIAGNOSIS — E669 Obesity, unspecified: Secondary | ICD-10-CM | POA: Insufficient documentation

## 2023-11-27 DIAGNOSIS — Z6833 Body mass index (BMI) 33.0-33.9, adult: Secondary | ICD-10-CM | POA: Insufficient documentation

## 2023-11-27 LAB — COMPREHENSIVE METABOLIC PANEL
ALT: 23 U/L (ref 0–35)
AST: 20 U/L (ref 0–35)
Albumin: 4.7 g/dL (ref 3.5–5.2)
Alk Phos: 94 U/L (ref 35–105)
Anion Gap: 15 (ref 7–16)
Bilirubin,Total: 1.2 mg/dL (ref 0.0–1.2)
CO2: 24 mmol/L (ref 20–28)
Calcium: 9.7 mg/dL (ref 8.6–10.2)
Chloride: 101 mmol/L (ref 96–108)
Creatinine: 0.9 mg/dL (ref 0.51–0.95)
Glucose: 81 mg/dL (ref 60–99)
Lab: 10 mg/dL (ref 6–20)
Potassium: 4.5 mmol/L (ref 3.3–5.1)
Sodium: 140 mmol/L (ref 133–145)
Total Protein: 7.7 g/dL (ref 6.3–7.7)
eGFR BY CREAT: 78

## 2023-11-27 LAB — LIPID PANEL
Chol/HDL Ratio: 4.1
Cholesterol: 231 mg/dL — AB
HDL: 56 mg/dL (ref 40–60)
LDL Calculated: 157 mg/dL — AB
Non HDL Cholesterol: 175 mg/dL
Triglycerides: 101 mg/dL

## 2023-11-27 LAB — CBC AND DIFFERENTIAL
Baso # K/uL: 0 THOU/uL (ref 0.0–0.2)
Eos # K/uL: 0.1 THOU/uL (ref 0.0–0.5)
Hematocrit: 47 % (ref 34–49)
Hemoglobin: 14.8 g/dL (ref 11.2–16.0)
IMM Granulocytes #: 0 THOU/uL
IMM Granulocytes: 0.4 %
Lymph # K/uL: 3 THOU/uL (ref 1.0–5.0)
MCV: 98 fL (ref 75–100)
Mono # K/uL: 0.5 THOU/uL (ref 0.1–1.0)
Neut # K/uL: 4 THOU/uL (ref 1.5–6.5)
Nucl RBC # K/uL: 0 THOU/uL
Nucl RBC %: 0 /100{WBCs} (ref 0.0–0.2)
Platelets: 267 THOU/uL (ref 150–450)
RBC: 4.8 MIL/uL (ref 4.0–5.5)
RDW: 12.3 % (ref 0.0–15.0)
Seg Neut %: 52 %
WBC: 7.7 THOU/uL (ref 3.5–11.0)

## 2023-11-27 LAB — MICROALBUMIN, URINE, RANDOM
Creatinine,UR: 166 mg/dL (ref 20–300)
Microalbumin,UR: <1.2 mg/dL

## 2023-11-27 LAB — TIBC
Iron: 119 ug/dL (ref 34–165)
TIBC: 381 ug/dL (ref 250–450)
Transferrin Saturation: 31 % (ref 15–50)

## 2023-11-27 LAB — FERRITIN: Ferritin: 257 ng/mL — ABNORMAL HIGH (ref 10–120)

## 2023-11-27 LAB — VITAMIN D: 25-OH Vit Total: 32 ng/mL (ref 30–60)

## 2023-11-27 LAB — VITAMIN B12: Vitamin B12: 572 pg/mL (ref 232–1245)

## 2023-11-27 LAB — TSH: TSH: 0.72 u[IU]/mL (ref 0.27–4.20)

## 2023-11-27 LAB — TRANSFERRIN: Transferrin: 282 mg/dL (ref 200–360)

## 2023-11-27 LAB — T4, FREE: Free T4: 1.2 ng/dL (ref 0.9–1.7)

## 2023-11-28 ENCOUNTER — Other Ambulatory Visit: Payer: Self-pay

## 2023-11-28 ENCOUNTER — Ambulatory Visit: Attending: Primary Care | Admitting: Primary Care

## 2023-11-28 ENCOUNTER — Encounter: Payer: Self-pay | Admitting: Primary Care

## 2023-11-28 VITALS — BP 112/72 | HR 72 | Temp 96.6°F | Wt 194.6 lb

## 2023-11-28 DIAGNOSIS — G4733 Obstructive sleep apnea (adult) (pediatric): Secondary | ICD-10-CM | POA: Insufficient documentation

## 2023-11-28 DIAGNOSIS — E119 Type 2 diabetes mellitus without complications: Secondary | ICD-10-CM | POA: Insufficient documentation

## 2023-11-28 DIAGNOSIS — E559 Vitamin D deficiency, unspecified: Secondary | ICD-10-CM | POA: Insufficient documentation

## 2023-11-28 DIAGNOSIS — Z6832 Body mass index (BMI) 32.0-32.9, adult: Secondary | ICD-10-CM | POA: Insufficient documentation

## 2023-11-28 DIAGNOSIS — E669 Obesity, unspecified: Secondary | ICD-10-CM | POA: Insufficient documentation

## 2023-11-28 DIAGNOSIS — G4700 Insomnia, unspecified: Secondary | ICD-10-CM | POA: Insufficient documentation

## 2023-11-28 DIAGNOSIS — E785 Hyperlipidemia, unspecified: Secondary | ICD-10-CM | POA: Insufficient documentation

## 2023-11-28 LAB — HEMOGLOBIN A1C: Hemoglobin A1C: 5.5 % (ref <=5.6)

## 2023-11-28 MED ORDER — TIRZEPATIDE 5 MG/0.5ML SC SOAJ *I*
5.0000 mg | SUBCUTANEOUS | 0 refills | Status: DC
Start: 2023-11-28 — End: 2024-01-13

## 2023-11-28 NOTE — Assessment & Plan Note (Signed)
 Weight loss of 5 to 7 % of body weight is our initial weight loss goal.  Weight loss of more than 5% can reduce risk factors for cardiovascular disease, such as dyslipidemia, hypertension, and diabetes mellitus.  -Get at least 150 minutes per week of moderate-intensity aerobic activity or 75 minutes per week of vigorous aerobic activity, or a combination of both, preferably spread throughout the week.  Add moderate - to high-intensity muscle-strengthening activity (such as resistance or weights) on at least 2 days per week.  -Follow low carb high protein diet.

## 2023-11-28 NOTE — Assessment & Plan Note (Signed)
 She has been diagnosed with sleep apnea but is not currently using a CPAP machine due to anxiety.- She is advised to follow up with her dentist regarding the mouthpiece and to explore other options such as a nasal mask.- Untreated sleep apnea can lead to serious complications like arrhythmia, heart attack, stroke, and death, so it is crucial to address this issue.

## 2023-11-28 NOTE — Assessment & Plan Note (Signed)
-   She reports difficulty falling asleep and maintaining sleep, which may be related to her sleep apnea.- She is advised to consult a sleep clinic for further evaluation and management of her sleep apnea.- In the meantime, she can take magnesium glycinate or melatonin to help with sleep. If these do not work, Rozerem may be prescribed.

## 2023-11-28 NOTE — Assessment & Plan Note (Addendum)
-   LDL cholesterol level is 157, which is above the desired range of <100.- Total cholesterol is 231, and triglycerides are good.- The current dosage of rosuvastatin  20 mg will be maintained for another 2 to 3 months.- A repeat cholesterol test will be conducted in 3 months to assess the need for a potential increase in the rosuvastatin  dosage to 40 mg.LDL above goal of < 70mg /dLNo evidence of statin intoleranceNo evidence of rhabdomyolysisUA, Cr and LFTs WNLWill continue current dose for another 3 months and re-evaluate

## 2023-11-28 NOTE — Assessment & Plan Note (Signed)
-   She has lost 4 pounds since the last visit, which is a positive sign.- Blood pressure is 112/72, which is perfect.- The dosage of Mounjaro  will be increased to further aid in weight loss.- Medication sent to pharmacy.

## 2023-11-28 NOTE — Progress Notes (Signed)
 [facility] Internal Medicine [name], MD   Chief Complaint Patient presents with  Follow-up   1 month F/U    Eileen Bond is a 48 y.o. female HPI History of Present IllnessThe patient presents for a review of her blood work.She has been on Mounjaro  for 2 months, which initially caused some discomfort but has since improved. She is considering increasing the dosage. She has regained some weight due to recent travel, during which she consumed whatever food was available. Her diet primarily consists of vegetables and seafood, with occasional meat consumption. She has reduced her milk intake and does not consume sugar, opting for honey instead. She occasionally drinks diet coke or zero coke and eats low GI rice in the morning.She has a history of elevated ANA levels, which were not fully understood. However, her condition improved significantly after starting vitamin D  supplementation. Her vitamin D  level was low before.She is currently on rosuvastatin  20 mg, which was increased at her last visit. She is surprised by her consistently high cholesterol levels, as she rarely consumes meat and has made efforts to reduce her sugar intake.She experiences difficulty falling asleep and maintaining sleep, often feeling drowsy during the day. If she wakes up to use the bathroom, she struggles to fall back asleep. She has been diagnosed with sleep apnea and was recommended to use a CPAP machine or a mouthpiece. However, she has been hesitant to use the CPAP machine due to anxiety about having something on her face. She is currently waiting for approval for a mouthpiece from her dentist. She has undergone a sleep study and was advised to consult her dentist for the mouthpiece. She plans to contact her dentist today. She is a light sleeper and has had adverse reactions to sleep-inducing medications in the past, which have kept her awake at  night.Diet: Primarily vegetables and seafood, reduced milk intake, no sugar (uses honey), occasional diet coke or zero coke, low GI rice in the morningSleep: Difficulty falling asleep and maintaining sleep, feels drowsy during the day, light sleeper, adverse reactions to sleep-inducing medicationsHistory Chief Complaint Patient presents with  Follow-up   1 month F/U Medical/Surgical/Family History Past Medical History[1] Patient Active Problem List Diagnosis Code  Allergic rhinitis J30.9  Arthralgia of right knee M25.561  Factor V Leiden D68.51  Gestational diabetes mellitus O24.419  Hyperlipidemia E78.5  Malaise and fatigue R53.81, R53.83  Obesity E66.9  Post-thrombotic syndrome of left lower extremity I87.002  Type 2 diabetes mellitus E11.9  Vitamin D  deficiency E55.9  Occipital headache R51.9  Left arm numbness R20.0  BMI 32.0-32.9,adult Z68.32  Arthritis of left knee M17.12  OSA (obstructive sleep apnea) G47.33  Insomnia G47.00  Past Surgical History[2]Family History[3] Social History[4]Living Situation   Questions Responses  Patient lives with   Homeless   Caregiver for other family member   External Services   Employment   Domestic Violence Risk     Review of Systems ROS Physical Exam Vitals:  11/28/23 0918 BP: 112/72 Pulse: 72 Temp: 35.9 C (96.6 F) Weight: 88.3 kg (194 lb 9.6 oz)  Physical ExamClinical Assessment Assessment & Plan1. Weight management:- She has lost 4 pounds since the last visit, which is a positive sign.- Blood pressure is 112/72, which is perfect.- The dosage of Mounjaro  will be increased to further aid in weight loss.- Medication sent to pharmacy.2. Elevated cholesterol:- LDL cholesterol level is 157, which is above the desired range of <100.- Total cholesterol is 231, and triglycerides are good.- The  current dosage  of rosuvastatin  20 mg will be maintained for another 2 to 3 months.- A repeat cholesterol test will be conducted in 3 months to assess the need for a potential increase in the rosuvastatin  dosage to 40 mg.3. Insomnia:- She reports difficulty falling asleep and maintaining sleep, which may be related to her sleep apnea.- She is advised to consult a sleep clinic for further evaluation and management of her sleep apnea.- In the meantime, she can take magnesium glycinate or melatonin to help with sleep. If these do not work, Rozerem may be prescribed.4. Sleep apnea:- She has been diagnosed with sleep apnea but is not currently using a CPAP machine due to anxiety.- She is advised to follow up with her dentist regarding the mouthpiece and to explore other options such as a nasal mask.- Untreated sleep apnea can lead to serious complications like arrhythmia, heart attack, stroke, and death, so it is crucial to address this issue.5. Elevated ferritin:- Ferritin levels are slightly elevated, which may indicate inflammation.- She is advised to continue taking vitamin D  supplements as her levels have improved from 22-23 to 32.Follow-up: A follow-up visit is scheduled in 4 to 6 months.Plan Type 2 diabetes mellitus Lab Results Component Value Date  HA1C 5.9 (H) 03/12/2023  A1c% adequate for age and demographicsNo episodes of hypoglycemia reportedMicroalbumin range WNLCreatinine and E+ are all WNLLDL > goalObesityWeight loss of 5 to 7 % of body weight is our initial weight loss goal.Weight loss of more than 5% can reduce risk factors for cardiovascular disease, such as dyslipidemia, hypertension, and diabetes mellitus.-Get at least 150 minutes per week of moderate-intensity aerobic activity or 75 minutes per week of vigorous aerobic activity, or a combination of both, preferably spread throughout the week.Add moderate - to high-intensity muscle-strengthening  activity (such as resistance or weights) on at least 2 days per week.-Follow low carb high protein diet. Hyperlipidemia- LDL cholesterol level is 157, which is above the desired range of <100.- Total cholesterol is 231, and triglycerides are good.- The current dosage of rosuvastatin  20 mg will be maintained for another 2 to 3 months.- A repeat cholesterol test will be conducted in 3 months to assess the need for a potential increase in the rosuvastatin  dosage to 40 mg.LDL above goal of < 70mg /dLNo evidence of statin intoleranceNo evidence of rhabdomyolysisUA, Cr and LFTs WNLWill continue current dose for another 3 months and re-evaluate OSA (obstructive sleep apnea)  She has been diagnosed with sleep apnea but is not currently using a CPAP machine due to anxiety.- She is advised to follow up with her dentist regarding the mouthpiece and to explore other options such as a nasal mask.- Untreated sleep apnea can lead to serious complications like arrhythmia, heart attack, stroke, and death, so it is crucial to address this issue.Insomnia - She reports difficulty falling asleep and maintaining sleep, which may be related to her sleep apnea.- She is advised to consult a sleep clinic for further evaluation and management of her sleep apnea.- In the meantime, she can take magnesium glycinate or melatonin to help with sleep. If these do not work, Rozerem may be prescribed.BMI 32.0-32.9,adult - She has lost 4 pounds since the last visit, which is a positive sign.- Blood pressure is 112/72, which is perfect.- The dosage of Mounjaro  will be increased to further aid in weight loss.- Medication sent to pharmacy. Follow Up Follow up in about 6 weeks (around 01/09/2024) for Follow-up.    (MyChart Activation Status- Activated [1]) [name], MD [facility]  [facility] INTERNAL [id number] [facility] [place] [address]Izeu: [phone] Fax: [phone] This note was done with the assistance of [employer] AI recognition system.  A reasonable attempt to proofread for any mistakes was done. If any are noted, they in no way reflect the quality of the care rendered.  [1] Past Medical History:Diagnosis Date  Atopic dermatitis 03/19/2023  Chickenpox   in childhood, very mild case  Factor V Leiden mutation   Gestational diabetes mellitus   Hyperlipemia   Obesity   Post-thrombotic syndrome of left lower extremity   Vitamin D  deficiency  [2] History reviewed. No pertinent surgical history.[3] Family HistoryProblem Relation Name Age of Onset  High Blood Pressure Father    Diabetes Father    DVT (Deep Vein Thrombosis) Father    GERD Father    Liver Disease Father    No Known Problems Daughter    No Known Problems Son   [4] Social HistoryTobacco Use  Smoking status: Never   Passive exposure: Never  Smokeless tobacco: Never Substance Use Topics  Alcohol use: Never  Drug use: Never

## 2023-11-28 NOTE — Assessment & Plan Note (Signed)
 Lab Results Component Value Date  HA1C 5.9 (H) 03/12/2023  A1c% adequate for age and demographicsNo episodes of hypoglycemia reportedMicroalbumin range WNLCreatinine and E+ are all WNLLDL > goal

## 2023-12-11 ENCOUNTER — Other Ambulatory Visit: Payer: Self-pay | Admitting: Primary Care

## 2023-12-11 DIAGNOSIS — E119 Type 2 diabetes mellitus without complications: Secondary | ICD-10-CM

## 2023-12-11 DIAGNOSIS — E785 Hyperlipidemia, unspecified: Secondary | ICD-10-CM

## 2023-12-11 MED ORDER — ROSUVASTATIN CALCIUM 20 MG PO TABS *I*
ORAL_TABLET | ORAL | 1 refills | Status: AC
Start: 2023-12-11 — End: ?

## 2023-12-11 NOTE — Telephone Encounter (Signed)
 Renewal of Crestor . Confirmed with patient she takes 20 mg daily at night.

## 2023-12-18 ENCOUNTER — Telehealth: Payer: Self-pay | Admitting: Primary Care

## 2023-12-18 MED ORDER — METFORMIN HCL 500 MG PO TABS *I*
500.0000 mg | ORAL_TABLET | Freq: Two times a day (BID) | ORAL | 1 refills | Status: AC
Start: 2023-12-18 — End: ?

## 2023-12-18 NOTE — Telephone Encounter (Signed)
 metFORMIN  500 mg tablet[id number] - [place], [place] - [address] AT [place] [phone]

## 2024-01-09 ENCOUNTER — Ambulatory Visit: Attending: Primary Care | Admitting: Primary Care

## 2024-01-09 ENCOUNTER — Encounter: Payer: Self-pay | Admitting: Gastroenterology

## 2024-01-09 ENCOUNTER — Other Ambulatory Visit: Payer: Self-pay

## 2024-01-09 VITALS — BP 144/78 | HR 80 | Temp 97.6°F | Resp 16 | Ht 65.0 in | Wt 191.0 lb

## 2024-01-09 DIAGNOSIS — R030 Elevated blood-pressure reading, without diagnosis of hypertension: Secondary | ICD-10-CM | POA: Insufficient documentation

## 2024-01-09 DIAGNOSIS — E119 Type 2 diabetes mellitus without complications: Secondary | ICD-10-CM | POA: Insufficient documentation

## 2024-01-09 DIAGNOSIS — E669 Obesity, unspecified: Secondary | ICD-10-CM | POA: Insufficient documentation

## 2024-01-09 DIAGNOSIS — K5900 Constipation, unspecified: Secondary | ICD-10-CM | POA: Insufficient documentation

## 2024-01-09 DIAGNOSIS — E785 Hyperlipidemia, unspecified: Secondary | ICD-10-CM | POA: Insufficient documentation

## 2024-01-09 DIAGNOSIS — M533 Sacrococcygeal disorders, not elsewhere classified: Secondary | ICD-10-CM | POA: Insufficient documentation

## 2024-01-09 DIAGNOSIS — H538 Other visual disturbances: Secondary | ICD-10-CM | POA: Insufficient documentation

## 2024-01-09 NOTE — Assessment & Plan Note (Signed)
-   She reports painful bowel movements without constipation, hemorrhoids, or bleeding.- This could be related to a high-fiber diet or the mushroom coffee she is consuming. A referral to a gastroenterologist will be made for further evaluation, including a possible rectosigmoid scope.

## 2024-01-09 NOTE — Assessment & Plan Note (Signed)
-   Her eye exam was good. She had an MRI of her head in 08/2023, which was up to date.- She is advised to discontinue the use of mushroom coffee and any other natural supplements to see if her symptoms improve. A referral to a neurologist will be made for further evaluation.

## 2024-01-09 NOTE — Assessment & Plan Note (Signed)
 She reports pain in her lower back when sitting for extended periods, which has been ongoing for almost a month.- An x-ray of her lower back will be ordered to investigate the cause of her pain.

## 2024-01-09 NOTE — Progress Notes (Signed)
 UR [facility] Internal Medicine [name], MD   Chief Complaint Patient presents with  Follow-up   6 weeks follow up    Eileen Bond is a 48 y.o. female HPI History of Present IllnessThe patient is a 48 year old female who presents for follow-up of weight loss, diabetes, elevated blood pressure, blurred vision, lower back pain, and painful bowel movements.She was last seen on 11/28/2023 for weight loss. She continues to take Mounjaro  for diabetes and weight loss, as well as metformin . No new blood work has been done since her last visit. She has lost an additional 3 pounds since her last visit.She reports experiencing blurred vision even when wearing her glasses, which she does not use consistently. An eye examination was conducted recently, during which all tests returned normal results. She has not consulted a neurologist regarding this issue. She underwent an MRI of her head in 08/2023 due to severe pain following an overdose of Ozempic , which led her to suspect a stroke. She also had a CT scan performed.She experiences sharp lower back pain when sitting for extended periods and upon standing up. This pain has been present for approximately 3 weeks. She has tried various sitting positions to alleviate the pain. Sitting on a higher chair seems to help, but sitting on lower surfaces like her couch or bed exacerbates the pain. She is unsure if the pain is related to her bowel movements. She reports a family history of arthritis and mentions that her knees are always in poor condition.She reports painful bowel movements, particularly towards the end, but does not experience constipation. There is no swelling or bleeding reported. The pain has been present for the past 2 weeks and occurs with every bowel movement. She suspects it may be related to her high-fiber diet or the mushroom coffee she consumes. She does not believe it is  caused by Mounjaro . She notes that she needs to use the bathroom after eating anything, even small amounts, but does not experience diarrhea. She underwent a colonoscopy last year, which did not reveal any abnormalities. She did not experience pain at that time.Her blood pressure is slightly elevated today, which she attributes to rushing to the appointment. She consumes mushroom coffee, which is caffeine-free.Diet: Consumes a high-fiber dietCoffee/Tea/Caffeine-containing Drinks: Consumes mushroom coffee, which is caffeine-freeFAMILY HISTORYShe reports a family history of arthritis.History Chief Complaint Patient presents with  Follow-up   6 weeks follow up Medical/Surgical/Family History Past Medical History[1] Patient Active Problem List Diagnosis Code  Allergic rhinitis J30.9  Arthralgia of right knee M25.561  Factor V Leiden D68.51  Gestational diabetes mellitus O24.419  Hyperlipidemia E78.5  Malaise and fatigue R53.81, R53.83  Obesity E66.9  Post-thrombotic syndrome of left lower extremity I87.002  Type 2 diabetes mellitus E11.9  Vitamin D  deficiency E55.9  Occipital headache R51.9  Left arm numbness R20.0  BMI 32.0-32.9,adult Z68.32  Arthritis of left knee M17.12  OSA (obstructive sleep apnea) G47.33  Insomnia G47.00  Sacral back pain M53.3  Dyschezia K59.00  Blurred vision H53.8  Elevated blood pressure reading without diagnosis of hypertension R03.0  Past Surgical History[2]Family History[3] Social History[4]Living Situation   Questions Responses  Patient lives with   Homeless   Caregiver for other family member   External Services   Employment   Domestic Violence Risk     Review of Systems ROS Physical Exam Vitals:  01/09/24 1038 BP: 144/78 Pulse:  Resp:  Temp:  Weight:  Height:   Physical ExamClinical Assessment Assessment & Plan1.  Weight  loss:- She has lost an additional 3 pounds since her last visit.- Her A1c levels will be checked during her next visit, and adjustments to her medication will be made based on those results.- She will continue on Mounjaro  and metformin  for diabetes and weight loss.2. Blurred vision:- Her eye exam was good. She had an MRI of her head in 08/2023, which was up to date.- She is advised to discontinue the use of mushroom coffee and any other natural supplements to see if her symptoms improve. A referral to a neurologist will be made for further evaluation.3. Lower back pain:- She reports pain in her lower back when sitting for extended periods, which has been ongoing for almost a month.- An x-ray of her lower back will be ordered to investigate the cause of her pain.4. Painful bowel movements:- She reports painful bowel movements without constipation, hemorrhoids, or bleeding.- This could be related to a high-fiber diet or the mushroom coffee she is consuming. A referral to a gastroenterologist will be made for further evaluation, including a possible rectosigmoid scope.5. Elevated blood pressure:- Her blood pressure was slightly elevated during this visit, potentially due to rushing.- Her blood pressure will be rechecked before she leaves the office.Follow-up: The patient will follow up in 4 to 6 weeks.Plan Type 2 diabetes mellitusLab Results Component Value Date  HA1C 5.5 11/27/2023  A1c% adequate for age and demographicsNo episodes of hypoglycemia reportedMicroalbumin range WNLCreatinine and E+ are all WNLLDL at goal of  less than 70mg /dL  Will continue MounjaroObesityWeight loss of 5 to 7 % of body weight is our initial weight loss goal.Weight loss of more than 5% can reduce risk factors for cardiovascular disease, such as dyslipidemia, hypertension, and diabetes mellitus.-Get at least 150 minutes per week of moderate-intensity  aerobic activity or 75 minutes per week of vigorous aerobic activity, or a combination of both, preferably spread throughout the week.Add moderate - to high-intensity muscle-strengthening activity (such as resistance or weights) on at least 2 days per week.-Follow low carb high protein diet.  HyperlipidemiaWill update labs Sacral back pain  She reports pain in her lower back when sitting for extended periods, which has been ongoing for almost a month.- An x-ray of her lower back will be ordered to investigate the cause of her pain.Blurred vision - Her eye exam was good. She had an MRI of her head in 08/2023, which was up to date.- She is advised to discontinue the use of mushroom coffee and any other natural supplements to see if her symptoms improve. A referral to a neurologist will be made for further evaluation.Dyschezia - She reports painful bowel movements without constipation, hemorrhoids, or bleeding.- This could be related to a high-fiber diet or the mushroom coffee she is consuming. A referral to a gastroenterologist will be made for further evaluation, including a possible rectosigmoid scope.Elevated blood pressure reading without diagnosis of hypertension  Her blood pressure was slightly elevated during this visit, potentially due to rushing.- Her blood pressure will be rechecked before she leaves the office. Follow Up Follow up in about 1 month (around 02/09/2024) for Follow-up.    (MyChart Activation Status- Activated [1]) [name], MD UR [facility] [id number] [place] [id number]: [phone] Fax: [phone] This note was done with the assistance of DAX Copilot AI recognition system.  A reasonable attempt to proofread for any mistakes was done. If any are noted, they in no way reflect the quality of the care rendered.   [  1] Past Medical History:Diagnosis Date  Atopic dermatitis 03/19/2023  Chickenpox   in childhood, very mild case  Factor V Leiden mutation   Gestational diabetes mellitus   Hyperlipemia   Obesity   Post-thrombotic syndrome of left lower extremity   Vitamin D  deficiency  [2] No past surgical history on file.[3] Family HistoryProblem Relation Name Age of Onset  High Blood Pressure Father    Diabetes Father    DVT (Deep Vein Thrombosis) Father    GERD Father    Liver Disease Father    No Known Problems Daughter    No Known Problems Son   [4] Social HistoryTobacco Use  Smoking status: Never   Passive exposure: Never  Smokeless tobacco: Never Substance Use Topics  Alcohol use: Never  Drug use: Never

## 2024-01-09 NOTE — Assessment & Plan Note (Signed)
 Will update labs

## 2024-01-09 NOTE — Assessment & Plan Note (Signed)
 Her blood pressure was slightly elevated during this visit, potentially due to rushing.- Her blood pressure will be rechecked before she leaves the office.

## 2024-01-09 NOTE — Assessment & Plan Note (Signed)
 Weight loss of 5 to 7 % of body weight is our initial weight loss goal.  Weight loss of more than 5% can reduce risk factors for cardiovascular disease, such as dyslipidemia, hypertension, and diabetes mellitus.  -Get at least 150 minutes per week of moderate-intensity aerobic activity or 75 minutes per week of vigorous aerobic activity, or a combination of both, preferably spread throughout the week.  Add moderate - to high-intensity muscle-strengthening activity (such as resistance or weights) on at least 2 days per week.  -Follow low carb high protein diet.

## 2024-01-09 NOTE — Assessment & Plan Note (Signed)
 Lab Results Component Value Date  HA1C 5.5 11/27/2023  A1c% adequate for age and demographicsNo episodes of hypoglycemia reportedMicroalbumin range WNLCreatinine and E+ are all WNLLDL at goal of  less than 70mg /dL  Will continue Mounjaro 

## 2024-01-12 ENCOUNTER — Other Ambulatory Visit: Payer: Self-pay | Admitting: Primary Care

## 2024-01-28 ENCOUNTER — Ambulatory Visit: Admitting: Internal Medicine

## 2024-02-04 ENCOUNTER — Telehealth: Payer: Self-pay | Admitting: Internal Medicine

## 2024-02-04 NOTE — Telephone Encounter (Signed)
 I called and spoke with the patient and she Confirmed her appointment with us  for 02/06/24

## 2024-02-06 ENCOUNTER — Ambulatory Visit

## 2024-02-06 ENCOUNTER — Other Ambulatory Visit: Payer: Self-pay

## 2024-02-06 ENCOUNTER — Ambulatory Visit: Admission: RE | Admit: 2024-02-06 | Discharge: 2024-02-06 | Disposition: A | Source: Ambulatory Visit

## 2024-02-06 VITALS — BP 120/66 | HR 75 | Temp 97.0°F | Ht 65.0 in | Wt 194.0 lb

## 2024-02-06 DIAGNOSIS — K5900 Constipation, unspecified: Secondary | ICD-10-CM | POA: Insufficient documentation

## 2024-02-06 NOTE — Progress Notes (Signed)
 Eileen Bond [facility]astro[facility] Subjective Eileen Bond is a 48 y.o. female who presents for No chief complaint on file.History of Present IllnessThe patient is a 48 year old woman presenting today with dyschezia.She underwent a colonoscopy on 11/20/2022, performed by Dr. Jerel through [facility]. The results showed good prep, mild sigmoid colon diverticulosis, a small sessile 3 mm rectal polyp, and small internal hemorrhoids. Pathology biopsies revealed the polyp was hyperplastic associated with a prominent lymphoid aggregate. A 10-year follow-up was recommended at that time.Approximately 1.5 months ago, she began consuming mushroom coffee as part of a weight loss regimen. Subsequently, she experienced frequent blurry vision and painful bowel movements, particularly at the onset of defecation. Initially, she attributed these symptoms to complications from her Mounjaro  injection, which she started 3 months ago after switching from Ozempic . Her primary care physician suggested that the mushroom coffee could be causing these side effects. After discontinuing the coffee, her symptoms improved. However, she continues to experience frequent bowel movements, often immediately after eating, and is uncertain if this is related to her Mounjaro  treatment. She also reports occasional sharp pain during bowel movements, but no constipation, swelling, or bleeding. She does not feel any hemorrhoids. She has been symptom-free for the past 2 weeks since stopping the coffee. She describes the pain as spasmodic and stinging and adheres to a religious practice of using water for cleansing instead of wiping.She has a history of gallbladder sludge during pregnancy and undergoes regular ultrasounds of her gallbladder and liver, although she has not had one in the past 2 years. She is interested in understanding the potential side effects of Mounjaro  on her liver, given her family history of  liver disease.PAST SURGICAL HISTORY:CT scan and MRI of her head in 2025 due to an accidental double dose of Ozempic , which caused pain and numbness on the left side.SOCIAL HISTORYDiet: Consumes mushroom coffee.Coffee/Tea/Caffeine-containing Drinks: Consumes mushroom coffee.FAMILY HISTORY- Father: Liver problem involving a benign growth, no indication of cancer. Objective Blood pressure 120/66, pulse 75, temperature 36.1 C (97 F), height 1.651 m (5' 5), weight 88 kg (194 lb), SpO2 99%.Physical ExamResultsLabs - Pathology biopsy: 11/20/2022, Polyp was hyperplastic associated with prominent lymphoid aggregate - Liver enzymes: 11/2023, Normal - Cholesterol: 231 - LDL: Borderline highImaging - Colonoscopy: 11/20/2022, Mild sigmoid colon diverticulosis, a small sessile 3 mm rectal polyp and small internal hemorrhoids  Assessment & Plan1. Dyschezia:- Symptoms improved after stopping mushroom coffee, but discomfort persists with Mounjaro  though infrequently.- Possible stool retention in the colon causing pressure on hemorrhoids, leading to irritation and potential fissures.- Sitz marker transit study to be conducted:- If inconclusive, consider sigmoidoscopy for further investigation.Follow-up after sitz marker transit study. DAX copilot was utilized during the visit with the patient's consent.  Author: Velia DELENA Hensen, NP  Note signed: 02/06/2024

## 2024-02-11 ENCOUNTER — Ambulatory Visit
Admission: RE | Admit: 2024-02-11 | Discharge: 2024-02-11 | Disposition: A | Discharge status: Home / Self Care | Source: Ambulatory Visit

## 2024-02-11 ENCOUNTER — Other Ambulatory Visit: Payer: Self-pay

## 2024-02-11 DIAGNOSIS — K5900 Constipation, unspecified: Secondary | ICD-10-CM | POA: Insufficient documentation

## 2024-02-13 ENCOUNTER — Ambulatory Visit

## 2024-02-20 ENCOUNTER — Telehealth: Payer: Self-pay

## 2024-02-20 NOTE — Telephone Encounter (Signed)
 Patient called and left a message stating at her last visit, she had talked about her low back pain, and Dr Jerel had referred her to orthopaedic, but now she doesn't see any referral. No referral in chart, Please advise

## 2024-02-24 NOTE — Telephone Encounter (Signed)
Pt made aware of referral  °

## 2024-02-24 NOTE — Addendum Note (Signed)
 Addended by: JEREL REACH on: 02/24/2024 08:54 AM Modules accepted: Orders

## 2024-02-26 ENCOUNTER — Encounter: Payer: Self-pay | Admitting: Primary Care

## 2024-02-26 ENCOUNTER — Telehealth: Payer: Self-pay | Admitting: Internal Medicine

## 2024-02-26 NOTE — Telephone Encounter (Signed)
Spoke to patient to confirm

## 2024-02-26 NOTE — Telephone Encounter (Signed)
 Completed in provider folder for signature

## 2024-03-02 NOTE — Progress Notes (Signed)
 Patient: Eileen Bond DOB: 12-15-1975 MRN: [id number] Date: 03/04/2024 Chief ComplaintBack painSubjective  History of Present Illness The patient has history of coccyx pain x w/o inciting injury, since onset symptoms have been persistent.  They describe pain about the coccyx.  Symptoms are worse with pressing on it and with sitting.  Symptoms improve with taking the pressure off.  They deny numbness, tingling, weakness, and saddle anesthesia.-There is recent hx of Dyschezia (pelvic floor dyssynergia)Prior Care -01/20/2024 PCP: Seen for multiple issues including LBP.  She reports pain in her lower back when sitting for extended periods, which has been ongoing for almost a month.  An x-ray of her lower back will be ordered to investigate the cause of her pain.-03/03/24 GI: Dyschezia: Recent colonoscopy results do not necessitate another procedure at this time.- Dyschezia could potentially be linked to sacroiliac issue. - Incorporate a stool softener such as Colace, taking one or two daily. - Continue current hygiene practices, including the use of moist wipes instead of toilet tissue and soaking in a bath with Epsom salts.  Osteitis condensans ilii: Abdominal x-ray revealed sclerotic changes adjacent to the sacroiliac joints, suggestive of osteitis condensans ilii.  This condition may be contributing to pain, especially when sitting on hard surfaces. Function-03/04/2024 symptoms interfere with sitting.Conservative Treatment NoneMedication Management AcetaminophenIbuprofenInterventional ProceduresNone  Objective  Medical History Past Medical History[1] Patient Active Problem List Diagnosis Code  Allergic rhinitis J30.9  Arthralgia of right knee M25.561  Factor V Leiden D68.51  Gestational diabetes mellitus O24.419  Hyperlipidemia E78.5  Malaise and fatigue R53.81, R53.83  Obesity E66.9  Post-thrombotic syndrome of left lower  extremity I87.002  Type 2 diabetes mellitus E11.9  Vitamin D  deficiency E55.9  Occipital headache R51.9  Left arm numbness R20.0  BMI 32.0-32.9,adult Z68.32  Arthritis of left knee M17.12  OSA (obstructive sleep apnea) G47.33  Insomnia G47.00  Sacral back pain M53.3  Dyschezia K59.00  Blurred vision H53.8  Elevated blood pressure reading without diagnosis of hypertension R03.0  Past Surgical History[2]Social History Socioeconomic History  Marital status: Married Tobacco Use  Smoking status: Never   Passive exposure: Never  Smokeless tobacco: Never Substance and Sexual Activity  Alcohol use: Never  Drug use: Never Assistive Device: noneOccupational HistoryWork Status: unemployed Family History family history includes DVT (Deep Vein Thrombosis) in her father; Diabetes in her father; GERD in her father; High Blood Pressure in her father; Liver Disease in her father; No Known Problems in her daughter and son. Allergies Allergies[3]History of adverse reaction to anesthetics: noHistory of adverse reaction to contrast: noHistory of adverse reaction to steroids: noCurrent MedicationsMedications Ordered Prior to Encounter[4]AC/AP use (excluding ASA 81): yes Medication: EnoxaparinIndication: Factor V LeidenPrescribing physician: PCPOpioid use: noBenzodiazepine use: no Physical Exam 03/04/2024 General: Pleasant, NAD, overweight.CV: No peripheral edema in the bilateral lower limbs.Respiratory: Non-labored respirations, no wheezing or coughing noted from bedside.MSK: -Non-tender to palpation of the lumbar paraspinal muscles, PSIS, SI joints, gluteal muscles and greater trochanters bilaterally and focal TTP coccyx.-Lumbar spine ROM full with flexion and extension.  No pain with facet loading bilaterally.  -Manual muscle strength testing normal with respect to bilateral hip flexion, knee extension,  knee flexion, dorsiflexion, plantar flexion, and toe extension.-Hip ROM full bilaterally with respect to internal and external rotation.    Neuro: -Alert and oriented.-Reflexes unable to be elicited at the bilateral patellar and Achilles tendons.-Sensation intact to light touch in the bilateral lower limbs.-No evidence of clonus or abnormalities of tone.-Slump test negative bilaterally.-Gait non-antalgic and steady without use of assistive  device.Psych: Appropriate affect and mood.Skin: No open wounds, signs of infection or bruising on limited skin exam. Imaging >>>02/11/2024 Sitz marker KUB-abdomen xrays:FINDINGS:Bowel gas pattern:  Unremarkable.Bones and soft tissues:  No acute abnormality.Number of Sitz markers seen: 0Other findings: Sclerotic changes noted surrounding the ilia bilaterally adjacent to the SI joints. This may be consistent with osteitis condense and illii.  Impression Sitz markers as stated above.  Diagnostic Tests None  Assessment 48 y.o. female with  history of coccyx pain x w/o inciting injury, since onset symptoms have been persistent.  They describe pain about the coccyx.  Symptoms are worse with pressing on it and with sitting.  Symptoms improve with taking the pressure off.  There is recent hx of Dyschezia (pelvic floor dyssynergia).-Function is affected for sitting. -Treatment as per HPI including medication. -Sitz marker KUB-abdomen xray demonstrates Sclerotic changes noted surrounding the ilia bilaterally adjacent to the SI joints. This may be consistent with osteitis condense and illii.-Exam demonstrates focal TTP coccyx.  -Symptoms are due to coccyodynia with potential but unclear contribution from possible osteitis condensans and illii given concordant TTP focally about the coccyx.  Comorbidities:- Insomnia, Fatigue, malaise- Elevated BP w/o Dx HTN, Factor V Leiden mutation, postthrombotic  syndrome LLL (on AC)- DMII (11/27/2023 5.5)- Occipital headache- Bilateral knee OA- Obesity- Dyschezia (pelvic floor dyssynergia)Plan -Continue with previously ordered xray sacrum coccyx.-Start high-dose oral steroid taper (60mg ).  Risks, benefits, and adverse effects were discussed, the patient wished to proceed.  No anti-inflammatories while taking oral steroids.-Refer to PT to address coccyodynia, note hx of pelvic floor dyssynergia and possible osteitis condense and illii.  Include strengthening, stretching, HEP development as well as modalities including ultrasound, MFR, and e-stim.-Refer to pelvic floor PT.-Plans to get doughnut cushion.-Continue plan of care as per GI.-Follow up with PCP, pending clinical course can consider role for pain medicine referral, and if ongoing concern for osteitis condensans may consider [facility] referral.  -Follow-up as needed.Notes Reviewed Include:-PCP [1] Past Medical History:Diagnosis Date  Atopic dermatitis 03/19/2023  Chickenpox   in childhood, very mild case  Factor V Leiden mutation   Gestational diabetes mellitus   Hyperlipemia   Obesity   Post-thrombotic syndrome of left lower extremity   Vitamin D  deficiency  [2] No past surgical history on file.[3] No Known Allergies (drug, envir, food or latex)[4] Current Outpatient Medications on File Prior to Visit Medication Sig Dispense Refill  MOUNJARO  5 MG/0.5ML pen INJECT 5 MG SUBCUTANEOUSLY EVERY 7 DAYS 6 mL 1  metFORMIN  500 mg tablet Take 1 tablet (500 mg total) by mouth 2 times daily. 180 tablet 1  rosuvastatin  20 mg tablet Take 1 tablet by mouth daily. 90 tablet 1  enoxaparin  (LOVENOX ) 40 mg/0.4mL injection INJECT 0.4 MILLILITERS SUBCUTANEOUSLY DAILY, FOR USE WITH TRAVEL    multiple vitamin Take by mouth daily.    blood glucose (FREESTYLE LITE) test strip Test blood sugar daily 50 each 1  blood glucose monitor device Use to test  blood sugar as directed 1 each 0  blood glucose test strip Test using 1 strip.    cholecalciferol (VITAMIN D3) 125 MCG (5000 UT) CAPS capsule Take by mouth.    aspirin  81 mg EC tablet Take 1 tablet (81 mg total) by mouth daily.    betamethasone  diprop augmented (DIPROLENE -AF) 0.05 % ointment Apply topically 2 times daily. to the rash on the lower leg on the weekends Saturday and Sunday. 50 g 2 No current facility-administered medications on file prior to visit.

## 2024-03-03 ENCOUNTER — Ambulatory Visit

## 2024-03-03 ENCOUNTER — Other Ambulatory Visit: Payer: Self-pay

## 2024-03-03 VITALS — BP 100/84 | HR 84 | Ht 65.0 in | Wt 194.0 lb

## 2024-03-03 DIAGNOSIS — M8538 Osteitis condensans, other site: Secondary | ICD-10-CM | POA: Insufficient documentation

## 2024-03-03 DIAGNOSIS — K5900 Constipation, unspecified: Secondary | ICD-10-CM | POA: Insufficient documentation

## 2024-03-03 NOTE — Progress Notes (Signed)
 [name] Subjective Eileen Bond is a 48 y.o. female who presents for Follow-up (Sitz )History of Present IllnessThe patient is a 48 year old female who presents for follow-up after a Sitz marker transit study was performed for painful bowel movements (dyschezia).A colonoscopy was performed on 11/20/2022 by Dr. Jerel through [facility]. The results showed good prep, mild sigmoid colon diverticulosis, a small sessile 3 mm rectal polyp, and small internal hemorrhoids. Pathology revealed the polyp was hyperplastic associated with a prominent lymphoid aggregate. A 10-year follow-up was recommended at that time.Approximately 2 months ago, she began consuming mushroom coffee as part of a weight loss regimen. Subsequently, she experienced frequent blurry vision and painful bowel movements, particularly at the onset of defecation. Initially, she attributed the symptoms to complications of her Mounjaro  injection, which she started approximately 3.5 months ago after switching from Ozempic . She followed up with her PCP, who suggested the mushroom coffee could be causing her side effects. She discontinued drinking the coffee, and her symptoms improved except for frequent bowel movements after eating. A Sitz marker study on 02/11/2024 revealed an unremarkable bowel gas pattern and no remaining markers. There were sclerotic changes noted surrounding the iliac bilaterally adjacent to the sacroiliac joints, suggestive of osteitis condensans ilii. She has been experiencing pain and pressure during bowel movements for the past 3 days, which she describes as irritating but not severe. She reports no swelling or blood in the area and does not believe she has hemorrhoids. She has been cautious about her diet and suspects the pain may be a side effect of Mounjaro , as she has noticed an increase in bowel movements since starting the medication. She has discontinued  mushroom coffee due to its association with blurry vision and increased pain during bowel movements. She plans to start fasting soon and hopes this may alleviate some of her symptoms. She has been using an over-the-counter cream for relief.She has been experiencing hip pain for the past 2 months, particularly when sitting on hard surfaces. She has an appointment scheduled with a bone specialist tomorrow. She also reports pain in her tailbone area, which becomes sharp upon changing positions or standing up. She has been receiving acupuncture treatment.SOCIAL HISTORYCoffee/Tea/Caffeine-containing Drinks: The patient consumed mushroom coffee as part of a weight loss regimen but discontinued it due to side effects. The patient usually does not drink coffee every day but may consume it when visiting others who drink coffee. Objective Blood pressure 100/84, pulse 84, height 1.651 m (5' 5), weight 88 kg (194 lb), SpO2 98%.Physical ExamAppears the stated age, healthy, well developed and appropriately groomed. No signs of apparent distress present. Ability to communicate is appropriate for age. Alert and oriented. Patient is a good historian. ResultsImaging - Colonoscopy: 11/20/2022, Good prep, mild sigmoid colon diverticulosis, a small sessile 3 mm rectal polyp, and small internal hemorrhoids. Pathology revealed the polyp was hyperplastic associated with a prominent lymphoid aggregate. - Sitz marker transit study: 02/11/2024, Unremarkable bowel gas pattern and no remaining markers. Sclerotic changes noted surrounding the iliac bilaterally adjacent to the sacroiliac joints, suggestive of osteitis condensans ilii.  Assessment & Plan1. Dyschezia:- Recent colonoscopy results do not necessitate another procedure at this time.- Dyschezia could potentially be linked to sacroiliac issue.- Incorporate a stool softener such as Colace, taking one or two daily.- Continue current  hygiene practices, including the use of moist wipes instead of toilet tissue and soaking in a bath with Epsom salts.- If condition deteriorates, a colonoscopy may be considered.2. Osteitis condensans ilii:-  Abdominal x-ray revealed sclerotic changes adjacent to the sacroiliac joints, suggestive of osteitis condensans ilii.- This condition may be contributing to pain, especially when sitting on hard surfaces.- Appointment with a physical medicine and rehabilitation specialist scheduled for tomorrow to further evaluate and manage this condition.For follow-up if symptoms do not improve or worsen. DAX copilot was utilized during the visit with the patient's consent.  Author: Velia DELENA Hensen, NP  Note signed: 03/03/2024

## 2024-03-04 ENCOUNTER — Encounter: Payer: Self-pay | Admitting: Physical Medicine and Rehabilitation

## 2024-03-04 ENCOUNTER — Ambulatory Visit: Admitting: Physical Medicine and Rehabilitation

## 2024-03-04 VITALS — BP 121/77 | HR 75 | Ht 65.0 in | Wt 194.0 lb

## 2024-03-04 DIAGNOSIS — M533 Sacrococcygeal disorders, not elsewhere classified: Secondary | ICD-10-CM

## 2024-03-04 MED ORDER — PREDNISONE 5 MG PO TABS *I*: ORAL_TABLET | ORAL | 0 refills | Status: AC

## 2024-03-04 MED ORDER — PREDNISONE 10 MG PO TABS *I*: ORAL_TABLET | ORAL | 0 refills | Status: AC

## 2024-03-04 NOTE — Patient Instructions (Signed)
 Pelvic Floor Physical Therapy                The list of outside physical therapy offices below has been compiled for your convenience in finding someone who can treat the musculoskeletal component of your chronic pelvic pain. There may be other physical therapists who can treat you that are more conveniently located. The key question to ask is, "Does someone in your office specialize in the treatment of pelvic floor tension myalgia and abdominal myofascial pain for chronic pelvic pain?" If they are not familiar with those terms, it is probably best to look for a different office. If you find someone who is not on our list, please let us know so that we may add them!   At our first visit, the physical therapist will do a full assessment. After that, most people are seen by the Physical Therapist once or twice a week. Your treatment may have several components including: relaxing and releasing your pelvic floor and abdominal muscles, strengthening your core abdominal and back muscles, and even strengthening your hips and legs. In general, you should not be doing "Kegel exercises" as these tighten your pelvic muscles and may make your pain worse. For the best results, you should do the exercises you learn every day. Remember, it may take 8 weeks or more to notice a significant difference in your overall pain level.      [place]   [facility]      [address]   [place], [place] [address]   Phone: [phone]   Fax: [phone]     Physical Therapists: Shawna Orleans, DPT and Heide Spark, DPT  [facility]     [address]   [place], [place] [address]   Phone: [phone]   Fax: [phone]    Physical Therapists: Rica Koyanagi, DPT; Georgette Dover, DPT  [facility]      [facility]   [address]            [address] [place], [place] [address]                             [address]                                                                  [place], [place] [address]  Phone: [phone]   Fax: [phone]     Physical Therapists: Darcella Cheshire PT, DPT; Sharlett Iles PT, DPT; Vevelyn Pat PT, DPT, CWC; Jacklynn Bue PT, DPT, OCS; Myriam Jacobson PT   [facility]      [address]   [place], [place] [address]   Phone: [phone]   Fax: [phone]      Physical Therapists: Lavone Neri, PT, [facility]; Blenda Nicely, PT, [facility]; Rennie Plowman, DPT; Myrtis Hopping PT, DPT; Inez Catalina  PT, DPT, MTC, CPT   [facility]     [address]  [address] [place], [place], [address]  Phone: [phone]  Fax: [phone]    Physical Therapists: Rogelio Seen PT, DPT [facility]      [place] Office                                    [place] [facility]   [address]          [address]   [place], [place] [address]                       [place], [place] [address]   Phone: [phone]                     Phone: [phone]   Fax: [phone]                          [phone]     Physical Therapist:                          Physical Therapist:   Mariann Barter, MS, PT                 Elissa Hefty, PT            [place]   [facility]   [place] Office      [address] [place], [place] [address]   Phone: [phone]   Fax: [phone]     Physical Therapist: Rhona Raider, DPT [facility]    [address]                                                                                            [place], [place] [address]  Phone: [phone]                                                                                                       Fax: [phone]                                                                                                       Physical Therapist: Lupe Carney, PT                                                                  [facility]      [address]   [place], [place] [address]   Phone: [phone], press option #8   Fax: [phone]     Physical Therapist: Elmarie Mainland, PT   [facility]  [address] [place]                                                                [place], [place] [address]                                                                   Phone: [phone]                                                                                                                      Physical Therapist: Buford Dresser, DPT [facility]: Outpatient Rehab Services  [address]  [place], [place] [address]   Phone: [phone]  Fax: [phone]     Physical Therapist: Josie Dixon, DPT        [place]   [facility]      [address]   [place], [place] [address]   Phone: [phone]   Fax: [phone]     Physical Therapist: Roswell Nickel, DPT  [facility]      [address]   [place], [place] [address]   Phone: [phone]   Fax: [phone]     Physical Therapist: Waymon Amato, PT       [facility]   [facility]      [address]   [place], [place] [address]   Phone: [phone]   Fax: [phone]   Physical Therapist: Arbutus Ped, DPT  [facility] [place]                           and [facility]      [address]   [place], [place] [address]   Phone: [phone]   Fax: [phone]   Physical Therapist: Tessie Fass, DPT       [facility]  [facility] Therapy      [address]   [place], [place] [address]   Phone: [phone]   Fax: [phone]   Physical Therapists: Osborne Casco, PT, OCS; Liz Beach,   DPT; Wyline Beady, DPT; Florene Glen, DPT  [facility] at [facility]      [address]   [place], [place] [address]   Phone: [phone]   Fax: [phone]   Physical Therapist: Kathie Rhodes, DPT       [facility] at [facility]    [address]   [place], [place] [address] Phone: [phone]   Fax:    Physical Therapists: Ronalee Belts, DPT; Lurlean Horns, DPT, E-RYT; Darral Dash, PT; Lita Mains, PT; [place] Beers, DPT; Raynelle Jan, DPT, OCS; Gerline Legacy, PT, DPT, LMT  [facility]     [address]   [place], [place] [address] Phone: [phone] Fax:   Physical Therapists: Ronalee Belts, DPT; Lurlean Horns, DPT, E-RYT; Darral Dash, PT; Lita Mains, PT; [place] Beers, DPT; Raynelle Jan, DPT, OCS; Gerline Legacy, PT, DPT, LMT    [facility] at [facility]      [address]   [place], [place] [address]                                                                    Phone: [phone]   Fax:    Physical Therapists: Ronalee Belts, DPT; Lurlean Horns, DPT, E-RYT; Darral Dash, PT; Lita Mains, PT; [place] Beers, DPT; Raynelle Jan, DPT, OCS; Gerline Legacy, PT, DPT, LMT [facility]      [address]   [place], [place] [address]   Phone: [phone]   Fax: [phone]   Physical Therapist: Ignacia Bayley, PT, OCS     [place]  [facility]      [address]   [place], [place] [address]   Phone: [phone]   Fax: [phone]   Physical Therapist: Harlene Salts, DPT  [facility] at [facility]      [address]   [place], [place] [address]   Phone: [phone]   Fax: [phone]    Physical Therapists: Roderic Palau, DPT; Corinda Gubler, PT       [facility]   [facility]      [place] Office:                                                     [place] Office:   [address]                                              [address]   [place], [place] [address]                                               [place], [place]   [address]   Phone: [phone]                                       Phone: [phone] Fax: [phone]                                             Fax: [phone]     [facility]      [facility]   [address]   [place], [place] [address]   Phone: [phone]   Fax: [phone]   Physical Therapists: Junius Creamer, DPT, [facility]; Posey Pronto, DPT    [facility]      [address] [place], [place] [address]   Phone: [phone]   Fax: [phone]   Physical Therapist: Geraldo Docker, DPT, [facility]  [facility]      [address]   [place], [place] [address]   Phone: [phone]   Physical Therapists: Esau Grew, DPT, E-RYT; Jaquita Rector DPT    [facility] [place] [facility]      [address]   [place], [place] [address]   Phone: [phone]   Fax: [phone]   Physical Therapist: Shelbie Proctor, DPT  [facility]      [address]   [place], [place], [address]   Phone: [phone]   Fax: [phone]   Physical Therapist: Janifer Adie, DPT

## 2024-03-24 ENCOUNTER — Telehealth: Payer: Self-pay

## 2024-03-24 ENCOUNTER — Encounter: Payer: Self-pay | Admitting: Primary Care

## 2024-03-24 NOTE — Telephone Encounter (Signed)
 Blank TemplatePatient calling asking if her MOUNJARO  5 MG/0.5ML pen could be increased. States she has been taking the same dose for 3 months and it hasn't been doing its job. Please advise

## 2024-03-24 NOTE — Telephone Encounter (Signed)
 Last office visit - 10.9.25

## 2024-03-25 NOTE — Telephone Encounter (Signed)
 Left message for patient to schedule video

## 2024-03-30 ENCOUNTER — Ambulatory Visit: Admitting: Primary Care

## 2024-03-30 DIAGNOSIS — Z6832 Body mass index (BMI) 32.0-32.9, adult: Secondary | ICD-10-CM

## 2024-03-30 DIAGNOSIS — E119 Type 2 diabetes mellitus without complications: Secondary | ICD-10-CM

## 2024-03-30 DIAGNOSIS — M533 Sacrococcygeal disorders, not elsewhere classified: Secondary | ICD-10-CM

## 2024-03-30 DIAGNOSIS — D6851 Activated protein C resistance: Secondary | ICD-10-CM

## 2024-03-30 DIAGNOSIS — E669 Obesity, unspecified: Secondary | ICD-10-CM

## 2024-03-30 DIAGNOSIS — E785 Hyperlipidemia, unspecified: Secondary | ICD-10-CM

## 2024-03-30 MED ORDER — TIRZEPATIDE 7.5 MG/0.5ML SC SOAJ *I*: 7.5000 mg | SUBCUTANEOUS | 0 refills | Status: DC

## 2024-03-30 MED ORDER — TIRZEPATIDE 7.5 MG/0.5ML SC SOAJ *I*: 7.5000 mg | SUBCUTANEOUS | 0 refills | Status: AC

## 2024-03-30 NOTE — Assessment & Plan Note (Signed)
 Weight loss of 5 to 7 % of body weight is our initial weight loss goal.  Weight loss of more than 5% can reduce risk factors for cardiovascular disease, such as dyslipidemia, hypertension, and diabetes mellitus.  -Get at least 150 minutes per week of moderate-intensity aerobic activity or 75 minutes per week of vigorous aerobic activity, or a combination of both, preferably spread throughout the week.  Add moderate - to high-intensity muscle-strengthening activity (such as resistance or weights) on at least 2 days per week.  -Follow low carb high protein diet.

## 2024-03-30 NOTE — Progress Notes (Signed)
 [facility] [name], MD  TeleHome Video Encounter Mode of Communication with Patient for This Visit  Mode of Communication with Patient for This Visit: VideoPatient Location: HomeProvider Location: Clinic  No chief complaint on file.   Eileen Bond is a 48 y.o. female HPI History of Present IllnessThe patient presents for follow-up on her medication dose.She reports satisfactory blood glucose levels, although she does not monitor them daily. She has not experienced any hypoglycemic episodes. However, there is concern about recent weight gain despite maintaining her usual dietary habits. She is considering an increase in her Mounjaro  dosage to prevent further weight gain. She is currently on Mounjaro  5 mg.Additionally, there is persistent lower back pain, specifically in the tailbone area, which intensifies upon standing after prolonged sitting. The x-ray examination for this issue has not been completed due to travel commitments. An orthopedic specialist referred her to physical therapy, but appointments are unavailable until the end of 05/2024. She is contemplating whether an MRI would be beneficial. She is currently in [place]  and plans to travel to [place]  tomorrow. An abdominal x-ray for a stomach issue revealed a slightly compressed pelvic floor.History No chief complaint on file.Medical/Surgical/Family History Past Medical History[1] Patient Active Problem List Diagnosis Code  Allergic rhinitis J30.9  Arthralgia of right knee M25.561  Factor V Leiden D68.51  Gestational diabetes mellitus O24.419  Hyperlipidemia E78.5  Malaise and fatigue R53.81, R53.83  Obesity E66.9  Post-thrombotic syndrome of left lower extremity I87.002  Type 2 diabetes mellitus E11.9  Vitamin D  deficiency E55.9  Occipital headache R51.9  Left arm numbness R20.0  BMI  32.0-32.9,adult Z68.32  Arthritis of left knee M17.12  OSA (obstructive sleep apnea) G47.33  Insomnia G47.00  Sacral back pain M53.3  Dyschezia K59.00  Blurred vision H53.8  Elevated blood pressure reading without diagnosis of hypertension R03.0  Past Surgical History[2]Family History[3] Social History[4]Living Situation   Questions Responses  Patient lives with   Homeless   Caregiver for other family member   External Services   Employment   Domestic Violence Risk     Review of Systems ROS Physical Exam There were no vitals filed for this visit. Physical ExamClinical Assessment Assessment & Plan1. Blood glucose management:- Blood glucose levels are reported to be within the normal range, but there is a concern about slight weight gain.- The dosage of tirzepatide  has been increased to 7.5 mg.- A prescription for a 90-day supply has been sent to the pharmacy.2. Lower back pain:- The patient has not completed the x-ray for her sacrum and coccyx due to travel commitments and reports severe sharp pain in the tailbone area.- Advised to consult with an orthopedic specialist who specializes in tailbone issues.- Advised to seek medical attention at an emergency room if the pain intensifies during travels.- Advised to complete the x-ray upon return, and if the x-ray results indicate any abnormalities, an MRI will be ordered.Plan BMI 32.0-32.9,adult Weight loss of 5 to 7 % of body weight is our initial weight loss goal.Weight loss of more than 5% can reduce risk factors for cardiovascular disease, such as dyslipidemia, hypertension, and diabetes mellitus.-Get at least 150 minutes per week of moderate-intensity aerobic activity or 75 minutes per week of vigorous aerobic activity, or a combination of both, preferably spread throughout the week.Add moderate - to high-intensity muscle-strengthening activity  (such as resistance or weights) on at least 2 days per week.-Follow low carb high protein diet. Obesity - The dosage of tirzepatide  has been increased  to 7.5 mg.- A prescription for a 90-day supply has been sent to the pharmacy.Sacral back pain - The patient has not completed the x-ray for her sacrum and coccyx due to travel commitments and reports severe sharp pain in the tailbone area.- Advised to consult with an orthopedic specialist who specializes in tailbone issues.- Advised to seek medical attention at an emergency room if the pain intensifies during travels.- Advised to complete the x-ray upon return, and if the x-ray results indicate any abnormalities, an MRI will be ordered.Factor V LeidenOn Lovenox   Follow Up No follow-ups on file.    (MyChart Activation Status- Activated [1]) [name], MD [facility] INTER[facility] [id number] [facility] [place] [id number]: [phone] Fax: [phone] This note was done with the assistance of DAX Copilot AI recognition system.  A reasonable attempt to proofread for any mistakes was done. If any are noted, they in no way reflect the quality of the care rendered.  [1] Past Medical History:Diagnosis Date  Atopic dermatitis 03/19/2023  Chickenpox   in childhood, very mild case  Factor V Leiden mutation   Gestational diabetes mellitus   Hyperlipemia   Obesity   Post-thrombotic syndrome of left lower extremity   Vitamin D  deficiency  [2] No past surgical history on file.[3] Family HistoryProblem Relation Name Age of Onset  High Blood Pressure Father    Diabetes Father    DVT (Deep Vein Thrombosis) Father    GERD Father    Liver Disease Father    No Known Problems Daughter    No Known Problems Son   [4] Social HistoryTobacco Use  Smoking status: Never   Passive exposure:  Never  Smokeless tobacco: Never Substance Use Topics  Alcohol use: Never  Drug use: Never

## 2024-03-30 NOTE — Assessment & Plan Note (Signed)
 On Lovenox

## 2024-03-30 NOTE — Assessment & Plan Note (Signed)
-   The patient has not completed the x-ray for her sacrum and coccyx due to travel commitments and reports severe sharp pain in the tailbone area.- Advised to consult with an orthopedic specialist who specializes in tailbone issues.- Advised to seek medical attention at an emergency room if the pain intensifies during travels.- Advised to complete the x-ray upon return, and if the x-ray results indicate any abnormalities, an MRI will be ordered.

## 2024-03-30 NOTE — Assessment & Plan Note (Signed)
-   The dosage of tirzepatide  has been increased to 7.5 mg.- A prescription for a 90-day supply has been sent to the pharmacy.

## 2024-03-31 ENCOUNTER — Other Ambulatory Visit: Payer: Self-pay

## 2024-03-31 ENCOUNTER — Emergency Department (HOSPITAL_COMMUNITY)
Admission: EM | Admit: 2024-03-31 | Discharge: 2024-04-01 | Disposition: A | Discharge status: Home / Self Care | Attending: Emergency Medicine | Admitting: Emergency Medicine

## 2024-03-31 DIAGNOSIS — M545 Low back pain, unspecified: Secondary | ICD-10-CM | POA: Insufficient documentation

## 2024-03-31 DIAGNOSIS — M533 Sacrococcygeal disorders, not elsewhere classified: Secondary | ICD-10-CM | POA: Diagnosis present

## 2024-03-31 DIAGNOSIS — R109 Unspecified abdominal pain: Secondary | ICD-10-CM | POA: Insufficient documentation

## 2024-03-31 DIAGNOSIS — Z7982 Long term (current) use of aspirin: Secondary | ICD-10-CM | POA: Insufficient documentation

## 2024-03-31 NOTE — ED Triage Notes (Signed)
 Patient reports chronic pain at tailbone worse when sitting , pain radiating down to bilateral legs , denies injury or fall , ambulatory /no urinary symptoms .

## 2024-04-01 ENCOUNTER — Emergency Department (HOSPITAL_COMMUNITY)

## 2024-04-01 LAB — CBC WITH DIFFERENTIAL/PLATELET
Abs Immature Granulocytes: 0.03 K/uL (ref 0.00–0.07)
Basophils Absolute: 0 K/uL (ref 0.0–0.1)
Basophils Relative: 0 %
Eosinophils Absolute: 0.2 K/uL (ref 0.0–0.5)
Eosinophils Relative: 2 %
HCT: 42.1 % (ref 36.0–46.0)
Hemoglobin: 13.9 g/dL (ref 12.0–15.0)
Immature Granulocytes: 0 %
Lymphocytes Relative: 32 %
Lymphs Abs: 3.1 K/uL (ref 0.7–4.0)
MCH: 31.4 pg (ref 26.0–34.0)
MCHC: 33 g/dL (ref 30.0–36.0)
MCV: 95.2 fL (ref 80.0–100.0)
Monocytes Absolute: 0.5 K/uL (ref 0.1–1.0)
Monocytes Relative: 5 %
Neutro Abs: 5.9 K/uL (ref 1.7–7.7)
Neutrophils Relative %: 61 %
Platelets: 204 K/uL (ref 150–400)
RBC: 4.42 MIL/uL (ref 3.87–5.11)
RDW: 12.3 % (ref 11.5–15.5)
WBC: 9.8 K/uL (ref 4.0–10.5)
nRBC: 0 % (ref 0.0–0.2)

## 2024-04-01 LAB — BASIC METABOLIC PANEL WITH GFR
Anion gap: 11 (ref 5–15)
BUN: 13 mg/dL (ref 6–20)
CO2: 26 mmol/L (ref 22–32)
Calcium: 9.5 mg/dL (ref 8.9–10.3)
Chloride: 102 mmol/L (ref 98–111)
Creatinine, Ser: 0.76 mg/dL (ref 0.44–1.00)
GFR, Estimated: >60 mL/min
Glucose, Bld: 113 mg/dL — ABNORMAL HIGH (ref 70–99)
Potassium: 3.8 mmol/L (ref 3.5–5.1)
Sodium: 139 mmol/L (ref 135–145)

## 2024-04-01 LAB — HCG, SERUM, QUALITATIVE: Preg, Serum: NEGATIVE

## 2024-04-01 MED ORDER — IBUPROFEN 800 MG PO TABS
800.0000 mg | ORAL_TABLET | Freq: Once | ORAL | Status: AC
  Administered 2024-04-01: 800 mg via ORAL
  Filled 2024-04-01: qty 1

## 2024-04-01 MED ORDER — IOHEXOL 350 MG/ML SOLN
75.0000 mL | Freq: Once | INTRAVENOUS | Status: AC | PRN
  Administered 2024-04-01: 75 mL via INTRAVENOUS

## 2024-04-01 MED ORDER — OXYCODONE-ACETAMINOPHEN 5-325 MG PO TABS: 1.0000 | ORAL_TABLET | Freq: Four times a day (QID) | ORAL | 0 refills | Status: AC | PRN

## 2024-04-01 MED ORDER — OXYCODONE-ACETAMINOPHEN 5-325 MG PO TABS
1.0000 | ORAL_TABLET | Freq: Once | ORAL | Status: AC
  Administered 2024-04-01: 1 via ORAL
  Filled 2024-04-01: qty 1

## 2024-04-01 NOTE — ED Provider Notes (Signed)
 " [facility] AT [facility] Provider Note   CSN: [id number] Arrival date & time: 03/31/24  2303     Patient presents with: Tailbone Pain   Autumn Cruz is a 48 y.o. female.   Patient presents to the emergency department for evaluation of pain in the low back.  Patient reports that she has had pain in the area of her tailbone for approximately 1 month.  Pain has been progressively worsening.  She was seen by orthopedics and is being set up for physical therapy, however pain has become unbearable.  She was sitting for some time driving today and this worsened the pain.  She has not had any rectal bleeding, rectal pain, has not noticed any hemorrhoids, lumps or abscesses.  Patient reports that she fell about 3 months ago but this pain began only 1 month ago, no known injury.       Prior to Admission medications  Medication Sig Start Date End Date Taking? Authorizing Provider  oxyCODONE-acetaminophen (PERCOCET) 5-325 MG tablet Take 1 tablet by mouth every 6 (six) hours as needed for severe pain (pain score 7-10). 04/01/24  Yes Sharry Beining, Lonni PARAS, MD  albuterol  (VENTOLIN  HFA) 108 (90 Base) MCG/ACT inhaler Inhale 2 puffs into the lungs every 4 (four) hours as needed for wheezing or shortness of breath. 08/24/20   Vicky Charleston, PA-C  aspirin 81 MG tablet Take 81 mg by mouth as needed.    [provider]  benzonatate  (TESSALON ) 100 MG capsule Take 1 capsule (100 mg total) by mouth 2 (two) times daily as needed for cough. 08/24/20   Vicky Charleston, PA-C  enoxaparin  (LOVENOX ) 40 MG/0.4ML injection Inject 0.4 mLs (40 mg total) into the skin daily. With travel 07/25/20   Panosh, Apolinar POUR, MD  HYDROcodone  bit-homatropine (HYCODAN) 5-1.5 MG/5ML syrup hydrocodone -homatropine 5 mg-1.5 mg/5 mL oral syrup    [provider]  PAXLOVID 20 x 150 MG & 10 x 100MG  TBPK Take by mouth. 08/17/20   [provider]  rosuvastatin  (CRESTOR ) 10 MG tablet Take  1 tablet (10 mg total) by mouth daily. 08/16/20   Panosh, Wanda K, MD    Allergies: Patient has no known allergies.    Review of Systems  Updated Vital Signs BP 114/65 (BP Location: Left Arm)   Pulse 70   Temp 98.1 F (36.7 C)   Resp 19   SpO2 96%   Physical Exam Vitals and nursing note reviewed.  Constitutional:      General: She is not in acute distress.    Appearance: She is well-developed.  HENT:     Head: Normocephalic and atraumatic.     Mouth/Throat:     Mouth: Mucous membranes are moist.  Eyes:     General: Vision grossly intact. Gaze aligned appropriately.     Extraocular Movements: Extraocular movements intact.     Conjunctiva/sclera: Conjunctivae normal.  Cardiovascular:     Rate and Rhythm: Normal rate and regular rhythm.     Pulses: Normal pulses.     Heart sounds: Normal heart sounds, S1 normal and S2 normal. No murmur heard.    No friction rub. No gallop.  Pulmonary:     Effort: Pulmonary effort is normal. No respiratory distress.     Breath sounds: Normal breath sounds.  Abdominal:     General: Bowel sounds are normal.     Palpations: Abdomen is soft.     Tenderness: There is no abdominal tenderness. There is no guarding or rebound.  Hernia: No hernia is present.  Musculoskeletal:        General: No swelling.     Cervical back: Full passive range of motion without pain, normal range of motion and neck supple. No spinous process tenderness or muscular tenderness. Normal range of motion.       Back:     Right lower leg: No edema.     Left lower leg: No edema.  Skin:    General: Skin is warm and dry.     Capillary Refill: Capillary refill takes less than 2 seconds.     Findings: No ecchymosis, erythema, rash or wound.  Neurological:     General: No focal deficit present.     Mental Status: She is alert and oriented to person, place, and time.     GCS: GCS eye subscore is 4. GCS verbal subscore is 5. GCS motor subscore is 6.     Cranial Nerves:  Cranial nerves 2-12 are intact.     Sensory: Sensation is intact.     Motor: Motor function is intact.     Coordination: Coordination is intact.  Psychiatric:        Attention and Perception: Attention normal.        Mood and Affect: Mood normal.        Speech: Speech normal.        Behavior: Behavior normal.     (all labs ordered are listed, but only abnormal results are displayed) Labs Reviewed  BASIC METABOLIC PANEL WITH GFR - Abnormal; Notable for the following components:      Result Value   Glucose, Bld 113 (*)    All other components within normal limits  CBC WITH DIFFERENTIAL/PLATELET  HCG, SERUM, QUALITATIVE    EKG: None  Radiology: CT PELVIS W CONTRAST Result Date: 04/01/2024 EXAM: CT PELVIS, WITH IV CONTRAST 04/01/2024 02:32:18 AM TECHNIQUE: Axial images were acquired through the pelvis with IV contrast. Reformatted images were reviewed. Automated exposure control, iterative reconstruction, and/or weight based adjustment of the mA/kV was utilized to reduce the radiation dose to as low as reasonably achievable. COMPARISON: None available. CLINICAL HISTORY: Perianal abscess or fistula suspected; tailbone area pain - eval for soft tissue infection, tailbone pathology. FINDINGS: BONES: The osseous structures are unremarkable; no osseous erosions or abnormal periosteal reaction. Specifically, the coccyx is unremarkable. JOINTS: No dislocation. The joint spaces are normal. SOFT TISSUES: No focal inflammatory change or subcutaneous fluid collection is identified in the region of the coccyx and anus. INTRAPELVIC CONTENTS: 4.7 cm simple-appearing cyst within the left adnexa. Follow up sonography is recommended in 3 - 6 months to document stability or resolution. IMPRESSION: 1. No acute findings. No focal inflammatory change or subcutaneous fluid collection or osseous erosions in the region of the coccyx and anus. 2. 4.7 cm simple-appearing cyst within the left adnexa. Follow up  sonography is recommended in 3 - 6 months to document stability or resolution Electronically signed by: Dorethia Molt MD 04/01/2024 03:26 AM EST RP Workstation: [id number]     Procedures   Medications Ordered in the ED  oxyCODONE-acetaminophen (PERCOCET/ROXICET) 5-325 MG per tablet 1 tablet (1 tablet Oral Given 04/01/24 0039)  ibuprofen (ADVIL) tablet 800 mg (800 mg Oral Given 04/01/24 0039)  iohexol  (OMNIPAQUE ) 350 MG/ML injection 75 mL (75 mLs Intravenous Contrast Given 04/01/24 0233)  Medical Decision Making Amount and/or Complexity of Data Reviewed Labs: ordered. Decision-making details documented in ED Course. Radiology: ordered and independent interpretation performed. Decision-making details documented in ED Course.  Risk Prescription drug management.   Differential diagnosis considered includes, but not limited to:  Coccyx injury; inflammatory response; perianal/rectal abscess; pilonidal abscess  Presents to the emergency department for evaluation of pain in the area of the distal coccyx that has been ongoing for 1 month, progressively worsening.  There is no known injury to the area.  CT pelvis with contrast was performed to evaluate for soft tissue mass, infection, skeletal abnormality.  No acute findings.  Patient felt appropriate for discharge with analgesia can follow-up with her orthopedic and primary care.     Final diagnoses:  Coccyx pain    ED Discharge Orders          Ordered    oxyCODONE-acetaminophen (PERCOCET) 5-325 MG tablet  Every 6 hours PRN        04/01/24 0404               Haze Lonni PARAS, MD 04/01/24 [phone]  "

## 2024-04-01 NOTE — ED Notes (Signed)
 Evaluated by Dr. Haze at triage .

## 2024-04-01 NOTE — ED Provider Triage Note (Signed)
 Emergency Medicine Provider Triage Evaluation Note  Autumn Cruz , a 48 y.o. female  was evaluated in triage.  Pt complains of tailbone pain for one month, worsening. Worse when sitting. If she sits for a while pain radiates to groin bilaterally. No known injury (did fall in September, but no pain at that time).  Review of Systems  Positive: pain Negative: Soft tissue mass/hemorrhoid/abscess  Physical Exam  BP (!) 141/87 (BP Location: Right Arm)   Pulse 74   Temp 98.1 F (36.7 C)   Resp 15   SpO2 98%  Gen:   Awake, no distress   Resp:  Normal effort  MSK:   Moves extremities without difficulty  Other:    Medical Decision Making  Medically screening exam initiated at 12:35 AM.  Appropriate orders placed.  Maritza Sabado was informed that the remainder of the evaluation will be completed by another provider, this initial triage assessment does not replace that evaluation, and the importance of remaining in the ED until their evaluation is complete.     Haze Lonni PARAS, MD 04/01/24 [phone]

## 2024-04-16 ENCOUNTER — Ambulatory Visit: Admitting: Primary Care

## 2024-04-27 ENCOUNTER — Ambulatory Visit: Admitting: Primary Care

## 2024-05-06 ENCOUNTER — Ambulatory Visit: Admitting: Primary Care

## 2024-05-12 ENCOUNTER — Ambulatory Visit: Admitting: Primary Care

## 2024-06-30 ENCOUNTER — Encounter
